# Patient Record
Sex: Male | Born: 1965 | Race: White | Hispanic: No | Marital: Married | Smoking: Never smoker
Health system: Southern US, Community
[De-identification: ages and names within clinical notes are randomized; demographics above are authoritative.]

## PROBLEM LIST (undated history)

## (undated) DIAGNOSIS — I2510 Atherosclerotic heart disease of native coronary artery without angina pectoris: Secondary | ICD-10-CM

## (undated) DIAGNOSIS — Z8709 Personal history of other diseases of the respiratory system: Secondary | ICD-10-CM

## (undated) DIAGNOSIS — E785 Hyperlipidemia, unspecified: Secondary | ICD-10-CM

## (undated) DIAGNOSIS — K219 Gastro-esophageal reflux disease without esophagitis: Secondary | ICD-10-CM

## (undated) DIAGNOSIS — I340 Nonrheumatic mitral (valve) insufficiency: Principal | ICD-10-CM

## (undated) DIAGNOSIS — R42 Dizziness and giddiness: Secondary | ICD-10-CM

## (undated) DIAGNOSIS — I10 Essential (primary) hypertension: Secondary | ICD-10-CM

## (undated) DIAGNOSIS — Z9889 Other specified postprocedural states: Secondary | ICD-10-CM

## (undated) DIAGNOSIS — Z8719 Personal history of other diseases of the digestive system: Secondary | ICD-10-CM

## (undated) DIAGNOSIS — M109 Gout, unspecified: Secondary | ICD-10-CM

## (undated) DIAGNOSIS — Z9289 Personal history of other medical treatment: Secondary | ICD-10-CM

## (undated) HISTORY — DX: Gout, unspecified: M10.9

## (undated) HISTORY — DX: Nonrheumatic mitral (valve) insufficiency: I34.0

## (undated) HISTORY — PX: WISDOM TOOTH EXTRACTION: SHX21

## (undated) HISTORY — DX: Essential (primary) hypertension: I10

## (undated) HISTORY — PX: HAND SURGERY: SHX662

## (undated) HISTORY — DX: Atherosclerotic heart disease of native coronary artery without angina pectoris: I25.10

## (undated) HISTORY — PX: KNEE SURGERY: SHX244

## (undated) HISTORY — DX: Personal history of other medical treatment: Z92.89

---

## 2006-01-02 ENCOUNTER — Ambulatory Visit: Payer: Self-pay | Admitting: Family Medicine

## 2007-02-03 ENCOUNTER — Encounter: Payer: Self-pay | Admitting: Family Medicine

## 2007-02-23 ENCOUNTER — Ambulatory Visit: Payer: Self-pay | Admitting: Family Medicine

## 2007-02-23 DIAGNOSIS — K209 Esophagitis, unspecified without bleeding: Secondary | ICD-10-CM | POA: Insufficient documentation

## 2007-02-23 DIAGNOSIS — E782 Mixed hyperlipidemia: Secondary | ICD-10-CM | POA: Insufficient documentation

## 2007-02-23 LAB — CONVERTED CEMR LAB: Cholesterol: 261 mg/dL

## 2007-03-02 ENCOUNTER — Ambulatory Visit: Payer: Self-pay | Admitting: Family Medicine

## 2008-11-11 ENCOUNTER — Ambulatory Visit: Payer: Self-pay | Admitting: Family Medicine

## 2008-11-11 DIAGNOSIS — R229 Localized swelling, mass and lump, unspecified: Secondary | ICD-10-CM | POA: Insufficient documentation

## 2008-11-22 ENCOUNTER — Telehealth (INDEPENDENT_AMBULATORY_CARE_PROVIDER_SITE_OTHER): Payer: Self-pay | Admitting: *Deleted

## 2008-11-22 ENCOUNTER — Ambulatory Visit: Payer: Self-pay | Admitting: Family Medicine

## 2008-11-22 DIAGNOSIS — M109 Gout, unspecified: Secondary | ICD-10-CM | POA: Insufficient documentation

## 2009-06-03 ENCOUNTER — Ambulatory Visit: Payer: Self-pay | Admitting: Family Medicine

## 2009-07-01 ENCOUNTER — Encounter: Payer: Self-pay | Admitting: Family Medicine

## 2010-04-20 NOTE — Consult Note (Signed)
Summary: [facility]   Imported By: Lanelle Bal 07/28/2009 11:30:42  _____________________________________________________________________  External Attachment:    Type:   Image     Comment:   External Document
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
## 2010-04-20 NOTE — Consult Note (Signed)
Summary: [facility]   Imported By: Lanelle Bal 07/28/2009 11:35:06  _____________________________________________________________________  External Attachment:    Type:   Image     Comment:   External Document

## 2010-05-21 ENCOUNTER — Ambulatory Visit (INDEPENDENT_AMBULATORY_CARE_PROVIDER_SITE_OTHER): Payer: BC Managed Care – PPO | Admitting: Family Medicine

## 2010-05-21 ENCOUNTER — Encounter: Payer: Self-pay | Admitting: Family Medicine

## 2010-05-21 DIAGNOSIS — R059 Cough, unspecified: Secondary | ICD-10-CM

## 2010-05-21 DIAGNOSIS — K209 Esophagitis, unspecified without bleeding: Secondary | ICD-10-CM

## 2010-05-21 DIAGNOSIS — R05 Cough: Secondary | ICD-10-CM

## 2010-05-24 ENCOUNTER — Emergency Department (HOSPITAL_COMMUNITY)
Admission: EM | Admit: 2010-05-24 | Discharge: 2010-05-24 | Disposition: A | Discharge status: Home / Self Care | Payer: BC Managed Care – PPO | Attending: Emergency Medicine | Admitting: Emergency Medicine

## 2010-05-24 ENCOUNTER — Emergency Department (HOSPITAL_COMMUNITY): Payer: BC Managed Care – PPO

## 2010-05-24 DIAGNOSIS — Z862 Personal history of diseases of the blood and blood-forming organs and certain disorders involving the immune mechanism: Secondary | ICD-10-CM | POA: Insufficient documentation

## 2010-05-24 DIAGNOSIS — R12 Heartburn: Secondary | ICD-10-CM | POA: Insufficient documentation

## 2010-05-24 DIAGNOSIS — R071 Chest pain on breathing: Secondary | ICD-10-CM | POA: Insufficient documentation

## 2010-05-24 DIAGNOSIS — Z8639 Personal history of other endocrine, nutritional and metabolic disease: Secondary | ICD-10-CM | POA: Insufficient documentation

## 2010-05-24 LAB — POCT I-STAT, CHEM 8
BUN: 16 mg/dL (ref 6–23)
Chloride: 105 mEq/L (ref 96–112)
Creatinine, Ser: 1.2 mg/dL (ref 0.4–1.5)
Potassium: 3.6 mEq/L (ref 3.5–5.1)
Sodium: 140 mEq/L (ref 135–145)
TCO2: 23 mmol/L (ref 0–100)

## 2010-05-24 LAB — POCT CARDIAC MARKERS
CKMB, poc: 2.8 ng/mL (ref 1.0–8.0)
Myoglobin, poc: 108 ng/mL (ref 12–200)
Troponin i, poc: <0.05 ng/mL (ref 0.00–0.09)
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
## 2010-06-02 ENCOUNTER — Telehealth (INDEPENDENT_AMBULATORY_CARE_PROVIDER_SITE_OTHER): Payer: Self-pay | Admitting: *Deleted

## 2010-06-08 NOTE — Progress Notes (Signed)
Summary: requests something for gout  Phone Note Call from Patient Call back at [phone]   Caller: Patient Summary of Call: Pt states he is having a painful gout flare up in right great toe and he is asking if something can be [facility] to [facility].  [facility]. Initial call taken by: Lowella Petties CMA, AAMA,  June 02, 2010 9:35 AM  Follow-up for Phone Call        f/u if not improving in the next couple of days.  colchicine often causes some GI upset and occ. diarrhea. This is now branded -- i would suggest printing a coupon from the colcrys website prior to picking it up. Follow-up by: Hannah Beat MD,  June 02, 2010 9:56 AM  Additional Follow-up for Phone Call Additional follow up Details #1::        Patient advised via message  Additional Follow-up by: Benny Lennert CMA (AAMA),  June 02, 2010 10:30 AM    New/Updated Medications: DICLOFENAC SODIUM 75 MG TBEC (DICLOFENAC SODIUM) 1 by mouth bid. take with food COLCRYS 0.6 MG TABS (COLCHICINE) 1 by mouth two times a day Prescriptions: COLCRYS 0.6 MG TABS (COLCHICINE) 1 by mouth two times a day  #60 x 0   Entered and Authorized by:   Hannah Beat MD   Signed by:   Hannah Beat MD on 06/02/2010   Method used:   [facility]        [facility] [id number]       [address]       [place], [place]  [address]       Ph: [phone] or [phone]       Fax: [phone]   RxID:   [id number] DICLOFENAC SODIUM 75 MG TBEC (DICLOFENAC SODIUM) 1 by mouth bid. take with food  #40 x 0   Entered and Authorized by:   Hannah Beat MD   Signed by:   Hannah Beat MD on 06/02/2010   Method used:   [facility]        [facility] ([facility]ail[facility]       [address]       [place], [place]  [address]       Ph: [phone] or [phone]       Fax: [phone]   RxID:   [id number]

## 2010-08-06 NOTE — Assessment & Plan Note (Signed)
[facility]                             [facility] OFFICE NOTE   Lawrence, Underwood                          MRN:          [id number]  DATE:01/02/2006                            DOB:          June 01, 1965    CHIEF COMPLAINT:  A 45 year old, white male here to establish new doctor.  He is new to the area having moved from [place] in July 2007.  He states  he is interested in talking about chest pain, intermittent.  He has had  several episodes over the past few years with one to two episodes per year  of left upper chest pain.  The pain initially is sharp lasting a few  minutes, but then there is lingering chest pain for about 4-8 hours.  When  he has the chest pain, he is at rest and he denies any chest pain with  exercise or exertion.  He denies nausea, vomiting or sweating or shortness  of breath along with the chest pain.  He states that he is not under a  significant amount of stress.  He does report some feelings of heartburn at  night that he thinks are worse with alcohol and soda.  He is overweight and  Lawrence Underwood realizes he is over his ideal weight.  He is interested in  discussing nutrition and getting back into a regular exercise pattern.   REVIEW OF SYSTEMS:  No headache, no dizziness, no syncope, no glasses, no  hearing problems, no dyspnea, no palpitations, no wheeze, no cough, no  nausea, vomiting, diarrhea or rectal bleeding, no nocturia.  No increased  thirst.  He does have some rhinitis in the spring.   PAST MEDICAL HISTORY:  None.   PAST SURGICAL HISTORY:  1. Knee injury resulting in surgery at age 58.  2. Hand surgery resulting in surgery at age 45.   ALLERGIES:  No known drug allergies.   MEDICATIONS:  None.   SOCIAL HISTORY:  He works in Air[employer].  He has been married x13 years and has  three kids ranging age 39-10.  They are all healthy.  He walks some, but not  as much as he use to and is in a regular exercise routine.   He frequently  skips breakfast or lung and eats a huge dinner including meats and  vegetables as well as lots of cheese.  He sometimes has a nighttime snack.  He only occasionally gets fruit.  He is trying to avoid soda and ice tea.  He drinks 2-3 beers on the weekends.  He does not smoke and does not use any  other drugs.   FAMILY HISTORY:  Father alive at age 67.  He had a heart attack at age 34  and stroke at age 65.  Mother alive at age 11, healthy.  Two sisters are  healthy.  No family history of diabetes.  No family history of cancer.  His  parents do have depression and sister did have anorexia.  He does feel that  his father was an alcoholic.  His uncle also had an  MI at age 17.   PHYSICAL EXAMINATION:  VITAL SIGNS:  Height 70.5 inches, weight 204.6, BMI  29.  Blood pressure 122/84, pulse 80, temperature 97.6.  GENERAL:  Overweight, appearing muscular male in no apparent distress.  HEENT:  PERRLA.  Extraocular movements intact.  Oropharynx clear.  Tympanic  membranes clear.  Nares clear.  No thyromegaly.  No lymphadenopathy,  supraclavicular or cervical.  CARDIAC:  Regular rate and rhythm, no murmurs, rubs or gallops.  Normal PMI  with 2+ peripheral pulses, no peripheral edema.  LUNGS:  Clear to auscultation bilaterally, no wheezes, rales or rhonchi.  No  cyanosis or clubbing.  ABDOMEN:  Soft, nontender, normoactive bowel sounds.  No hepatosplenomegaly.  MUSCULOSKELETAL:  Strength 5/5 in upper and lower extremities.  NEUROLOGIC:  Alert and oriented x3.  Cranial nerves 2-12 grossly intact.  Reflexes 2+.   DIAGNOSTIC STUDIES:  EKG normal sinus rhythm with no Q waves or ST changes.   ASSESSMENT/PLAN:  1. Chest pain, noncardiac.  This is most likely secondary to intermittent      reflux symptoms.  I will obtain records from his previous doctor.  He      will, in the meantime, begin Prilosec 40 mg daily x4-6 weeks to treat      reflux.  He will avoid foods that trigger his  symptoms.  If his      symptoms do not improve, we can consider further workup.  Given his      family history of coronary artery disease, we can consider risk factor      modification aggressively.  He will look into when his last cholesterol      panel was checked and I will see if the records of this are included in      the records we will receive from his previous physician.  In the      meantime, he was encouraged to increase exercise and to work on weight      loss.  I encouraged him to eat three meals a day.  2. Prevention.  We will need to check cholesterol panel if this has not      been done recently, although he states that he thinks the insurance      company checked it several months ago.  Encourage weight loss and      exercise as stated above.  The patient will follow up with me following      receipt of records.       [name], MD      AB/MedQ  DD:  01/02/2006  DT:  01/03/2006  Job #:  [id number]

## 2011-01-26 ENCOUNTER — Ambulatory Visit (INDEPENDENT_AMBULATORY_CARE_PROVIDER_SITE_OTHER): Payer: BC Managed Care – PPO | Admitting: Family Medicine

## 2011-01-26 ENCOUNTER — Ambulatory Visit (INDEPENDENT_AMBULATORY_CARE_PROVIDER_SITE_OTHER)
Admission: RE | Admit: 2011-01-26 | Discharge: 2011-01-26 | Disposition: A | Discharge status: Home / Self Care | Payer: BC Managed Care – PPO | Source: Ambulatory Visit | Attending: Family Medicine | Admitting: Family Medicine

## 2011-01-26 ENCOUNTER — Encounter: Payer: Self-pay | Admitting: Family Medicine

## 2011-01-26 VITALS — BP 120/72 | HR 68 | Temp 97.9°F | Wt 206.0 lb

## 2011-01-26 DIAGNOSIS — M25572 Pain in left ankle and joints of left foot: Secondary | ICD-10-CM

## 2011-01-26 DIAGNOSIS — M109 Gout, unspecified: Secondary | ICD-10-CM

## 2011-01-26 DIAGNOSIS — M25579 Pain in unspecified ankle and joints of unspecified foot: Secondary | ICD-10-CM

## 2011-01-26 MED ORDER — INDOMETHACIN 50 MG PO CAPS: 50.0000 mg | ORAL_CAPSULE | Freq: Three times a day (TID) | ORAL | Status: AC

## 2011-01-26 MED ORDER — COLCHICINE 0.6 MG PO TABS: 0.6000 mg | ORAL_TABLET | Freq: Two times a day (BID) | ORAL | Status: DC

## 2011-01-26 NOTE — Progress Notes (Signed)
  Subjective:    Patient ID: Lawrence Underwood, male    DOB: Aug 31, 1965, 45 y.o.   MRN: [id number]  HPI  Lawrence Underwood, a 45 y.o. male presents today in the office for the following:    The last three weeks was walking consistent. L median ankle.   Pleasant gentleman with a history of gout, who is otherwise healthy who presents with a several day history of left-sided median ankle pain. Has no occult injury. He has been increasing his activity over the last 2-3 weeks and walking about 2 miles a day. No ankle inversion or eversion. He does have some swelling medially and laterally. Some mild degree of warmth.  The PMH, PSH, Social History, Family History, Medications, and allergies have been reviewed in [facility], and have been updated if relevant.   Review of Systems REVIEW OF SYSTEMS  GEN: No fevers, chills. Nontoxic. Primarily MSK c/o today. MSK: Detailed in the HPI GI: tolerating PO intake without difficulty Neuro: No numbness, parasthesias, or tingling associated. Otherwise the pertinent positives of the ROS are noted above.      Objective:   Physical Exam   Physical Exam  Blood pressure 120/72, pulse 68, temperature 97.9 F (36.6 C), temperature source Oral, weight 206 lb (93.441 kg).  GEN: WDWN, NAD, Non-toxic, A & O x 3 HEENT: Atraumatic, Normocephalic. Neck supple. No masses, No LAD. Ears and Nose: No external deformity. EXTR: No c/c/e NEURO Normal gait.  PSYCH: Normally interactive. Conversant. Not depressed or anxious appearing.  Calm demeanor.   Entire forefoot is nontender. Nontender on the lateral malleolus. Mildly tender along the lateral malleolus. Moderately tender medially, round area inferior to the malleolus on the medial side. In the region of the deltoid ligament and posterior tibial tendon there is some mild degree of warmth and swelling. There is also some swelling laterally. Nontender to ATF or CFL. Negative drawer testing. Nontender along the entire forefoot. Nontender  at the navicular and cuboid.      Assessment & Plan:    1. Left ankle pain  DG Ankle Complete Left, indomethacin (INDOCIN) 50 MG capsule, colchicine 0.6 MG tablet    Orders Placed This Encounter  Procedures  . DG Ankle Complete Left    Standing Status: Future     Number of Occurrences: 1     Standing Expiration Date: 03/27/2012    Order Specific Question:  Preferred imaging location?    Answer:  [facility]    Order Specific Question:  Reason for exam:    Answer:  medial ankle pain    X-ray, Ankle: AP, Lateral, and Mortise Views Indication: Ankle pain Findings: There is no evidence for acute fracture or dislocation. The mortise appears preserved.   Discharge plan clinically most consistent with an acute ankle gout flare. Place the patient on Indocin and colchicine.

## 2011-02-17 ENCOUNTER — Encounter: Payer: Self-pay | Admitting: Family Medicine

## 2011-02-17 ENCOUNTER — Ambulatory Visit (INDEPENDENT_AMBULATORY_CARE_PROVIDER_SITE_OTHER): Payer: BC Managed Care – PPO | Admitting: Family Medicine

## 2011-02-17 VITALS — BP 120/84 | HR 70 | Temp 98.5°F | Ht 70.5 in | Wt 203.8 lb

## 2011-02-17 DIAGNOSIS — R05 Cough: Secondary | ICD-10-CM

## 2011-02-17 DIAGNOSIS — R053 Chronic cough: Secondary | ICD-10-CM

## 2011-02-17 DIAGNOSIS — R059 Cough, unspecified: Secondary | ICD-10-CM

## 2011-02-17 DIAGNOSIS — Z23 Encounter for immunization: Secondary | ICD-10-CM

## 2011-02-17 DIAGNOSIS — E782 Mixed hyperlipidemia: Secondary | ICD-10-CM

## 2011-02-17 DIAGNOSIS — M109 Gout, unspecified: Secondary | ICD-10-CM

## 2011-02-17 DIAGNOSIS — K209 Esophagitis, unspecified without bleeding: Secondary | ICD-10-CM

## 2011-02-17 DIAGNOSIS — Z0000 Encounter for general adult medical examination without abnormal findings: Secondary | ICD-10-CM

## 2011-02-17 NOTE — Progress Notes (Signed)
Subjective:    Patient ID: Lawrence Underwood, male    DOB: 22-Feb-1966, 45 y.o.   MRN: [id number]  HPI   45 year old male presents   Elevated Cholesterol: Due for re-eval but will have at work in 06/2011 Working aggressively on lifestyle. Diet compliance:occ sweet binging, rare fast food. Eating a lot of veggies, some fruits.  Exercise: He has been walking 2 miles everyday, HAs lost 3 lbs in last few months.  Other complaints: No CP, no SOB.   Gout: Attack about once a year. No clear triggers. Limited alcohol. Using colchicine prn. Not on any preventative. Will have uric acid test in 06/2011 at work  GERD: Well controlled.. Not using in last 6 months.  Was sick 3 weeks ago.. cough... persistant cough since then, mild, gradually improving.  Occ using OTC med, none now. Sneezing, congestion.  No reflux. No ST.     Review of Systems  Constitutional: Negative for fever, fatigue and unexpected weight change.  HENT: Negative for ear pain, congestion, sore throat, rhinorrhea, trouble swallowing and postnasal drip.   Eyes: Negative for pain.  Respiratory: Negative for cough, shortness of breath and wheezing.   Cardiovascular: Negative for chest pain, palpitations and leg swelling.  Gastrointestinal: Negative for nausea, abdominal pain, diarrhea, constipation and blood in stool.  Genitourinary: Negative for dysuria, urgency, hematuria, discharge, penile swelling, scrotal swelling, difficulty urinating, penile pain and testicular pain.  Skin: Negative for rash.  Neurological: Negative for syncope, weakness, light-headedness, numbness and headaches.  Psychiatric/Behavioral: Negative for behavioral problems and dysphoric mood. The patient is not nervous/anxious.        Objective:   Physical Exam  Constitutional: He appears well-developed and well-nourished.  Non-toxic appearance. He does not appear ill. No distress.  HENT:  Head: Normocephalic and atraumatic.  Right Ear: Hearing, tympanic  membrane, external ear and ear canal normal.  Left Ear: Hearing, tympanic membrane, external ear and ear canal normal.  Nose: Nose normal.  Mouth/Throat: Uvula is midline, oropharynx is clear and moist and mucous membranes are normal.  Eyes: Conjunctivae, EOM and lids are normal. Pupils are equal, round, and reactive to light. No foreign bodies found.  Neck: Trachea normal, normal range of motion and phonation normal. Neck supple. Carotid bruit is not present. No mass and no thyromegaly present.  Cardiovascular: Normal rate, regular rhythm, S1 normal, S2 normal, intact distal pulses and normal pulses.  Exam reveals no gallop.   No murmur heard. Pulmonary/Chest: Breath sounds normal. He has no wheezes. He has no rhonchi. He has no rales.  Abdominal: Soft. Normal appearance and bowel sounds are normal. There is no hepatosplenomegaly. There is no tenderness. There is no rebound, no guarding and no CVA tenderness. No hernia. Hernia confirmed negative in the right inguinal area and confirmed negative in the left inguinal area.  Genitourinary: Testes normal and penis normal. Right testis shows no mass and no tenderness. Left testis shows no mass and no tenderness. No paraphimosis or penile tenderness.  Lymphadenopathy:    He has no cervical adenopathy.       Right: No inguinal adenopathy present.       Left: No inguinal adenopathy present.  Neurological: He is alert. He has normal strength and normal reflexes. No cranial nerve deficit or sensory deficit. Gait normal.  Skin: Skin is warm, dry and intact. No rash noted.  Psychiatric: He has a normal mood and affect. His speech is normal and behavior is normal. Judgment normal.  Assessment & Plan:  Complet physcial Exam: The patient's preventative maintenance and recommended screening tests for an annual wellness exam were reviewed in full today. Brought up to date unless services declined.  Counselled on the importance of diet, exercise,  and its role in overall health and mortality. The patient's FH and SH was reviewed, including their home life, tobacco status, and drug and alcohol status.   Refused tdap.. Will do next appt. Flu vaccine given.

## 2011-02-17 NOTE — Assessment & Plan Note (Signed)
Stable well controlled

## 2011-02-17 NOTE — Assessment & Plan Note (Addendum)
Nonsmoker.  Improving slowly after acute illness.. Likely post inflammatory cough. Exam nml.  Can try antihistamine OTC if not improving.

## 2011-02-17 NOTE — Assessment & Plan Note (Signed)
Working aggressively on lifestyle changes.. If persistantly high at next check at work in 06/2011.. Will consider medication to treat.

## 2011-02-17 NOTE — Patient Instructions (Addendum)
Have labs in April sent to [place] at that time. Will review. If cholesterol not at goal or uric acid remains high... May consider prescription treatment of both.  Follow up appt in 1 year for CPX.

## 2011-02-17 NOTE — Assessment & Plan Note (Signed)
Due for uric acid.. Will have done at work in 06/2010.. If remains high given yearly flare.. Consider allopurinol.

## 2012-03-13 ENCOUNTER — Ambulatory Visit (INDEPENDENT_AMBULATORY_CARE_PROVIDER_SITE_OTHER): Payer: BC Managed Care – PPO | Admitting: Family Medicine

## 2012-03-13 ENCOUNTER — Encounter: Payer: Self-pay | Admitting: Family Medicine

## 2012-03-13 VITALS — BP 126/78 | HR 68 | Temp 97.8°F | Wt 205.8 lb

## 2012-03-13 DIAGNOSIS — N453 Epididymo-orchitis: Secondary | ICD-10-CM

## 2012-03-13 DIAGNOSIS — N451 Epididymitis: Secondary | ICD-10-CM

## 2012-03-13 NOTE — Assessment & Plan Note (Signed)
The testicle itself isn't tender.  He has no high risk behaviors.  No mass palpated.  He is on doxy already.  I would add on ibuprofen with GI caution and use local warm compresses to see if that helps.  If not improved, will have him notify PCP.  Would defer further w/u, ie consideration of scrotal u/s, to PCP.  Should be okay to travel by car.

## 2012-03-13 NOTE — Progress Notes (Signed)
He had some pain/pressure in L testicle episodically.  Noted initially about 3 weeks ago.  Was seen at [facility] and dx'd with epididymitis.  He'll still get pain episodically and urinary urgency during the episodes.  Started on doxy in meantime via UC.  He was overall improved until he had an episode of pain today.  He had been running more in the last few weeks, training for a 5K.  He's getting ready to leave town to go to [employer].    Meds, vitals, and allergies reviewed.   ROS: See HPI.  Otherwise, noncontributory.  nad Testes bilaterally descended without nodularity or masses. No scrotal masses or lesions. No penis lesions or urethral discharge. He is ttp on the superior aspect of the L testicle, but nowhere else.  No hernias

## 2012-03-13 NOTE — Patient Instructions (Addendum)
Finish the doxycycline.   Take up to 600mg  of ibuprofen up to 3 times a day with food.  Use a warm compress; see if that helps.  Wear supportive underwear and don't run for now.  If not improved, notify Dr. Ermalene Searing next week.   Take care.

## 2013-04-09 ENCOUNTER — Encounter: Payer: Self-pay | Admitting: Family Medicine

## 2013-04-09 ENCOUNTER — Ambulatory Visit (INDEPENDENT_AMBULATORY_CARE_PROVIDER_SITE_OTHER): Payer: BC Managed Care – PPO | Admitting: Family Medicine

## 2013-04-09 VITALS — BP 110/74 | HR 65 | Temp 97.5°F | Ht 70.0 in | Wt 197.0 lb

## 2013-04-09 DIAGNOSIS — E782 Mixed hyperlipidemia: Secondary | ICD-10-CM

## 2013-04-09 DIAGNOSIS — R7303 Prediabetes: Secondary | ICD-10-CM | POA: Insufficient documentation

## 2013-04-09 DIAGNOSIS — Z0000 Encounter for general adult medical examination without abnormal findings: Secondary | ICD-10-CM

## 2013-04-09 DIAGNOSIS — R7309 Other abnormal glucose: Secondary | ICD-10-CM

## 2013-04-09 DIAGNOSIS — Z23 Encounter for immunization: Secondary | ICD-10-CM

## 2013-04-09 NOTE — Addendum Note (Signed)
Addended by: Damita Lack on: 04/09/2013 08:01 AM   Modules accepted: Orders

## 2013-04-09 NOTE — Patient Instructions (Addendum)
Work on low cholesterol , low carb diet. Increase exercise ans weight loss. Call to schedule fasting labs in 3 months. Scheduled Tdap ( tetanus) if interested.

## 2013-04-09 NOTE — Assessment & Plan Note (Signed)
Encouraged exercise, weight loss, healthy eating habits. ? ?

## 2013-04-09 NOTE — Progress Notes (Signed)
Pre-visit discussion using our clinic review tool. No additional management support is needed unless otherwise documented below in the visit note.  

## 2013-04-09 NOTE — Progress Notes (Signed)
48 year old male presents  for annual wellness exam and preventative care.    Has labs fdone at work.  Noted BS  01/2012 100 11/14 103  no family history of DM.  Working on healthy eating and regular exercise. He has lost weight but has gained some back. Wt Readings from Last 3 Encounters:  04/09/13 197 lb (89.359 kg)  03/13/12 205 lb 12 oz (93.328 kg)  02/17/11 203 lb 12.8 oz (92.443 kg)   Elevated Cholesterol:  Done at work Total 247.. ? HDL and LDL  he will bring the new info. Working aggressively on lifestyle. Other complaints: No CP, no SOB.   Gout:No attacks in last year. No clear triggers. Limited alcohol.  Using colchicine prn. Not on any preventative.  Will have uric acid test in ? at work   GERD: Well controlled.. Not using in last 6 months.   Review of Systems  Constitutional: Negative for fever, fatigue and unexpected weight change.  HENT: Negative for ear pain, congestion, sore throat, rhinorrhea, trouble swallowing and postnasal drip.  Eyes: Negative for pain.  Respiratory: Negative for cough, shortness of breath and wheezing.  Cardiovascular: Negative for chest pain, palpitations and leg swelling.  Gastrointestinal: Negative for nausea, abdominal pain, diarrhea, constipation and blood in stool.  Genitourinary: Negative for dysuria, urgency, hematuria, discharge, penile swelling, scrotal swelling, difficulty urinating, penile pain and testicular pain.  Skin: Negative for rash.  Neurological: Negative for syncope, weakness, light-headedness, numbness and headaches.  Psychiatric/Behavioral: Negative for behavioral problems and dysphoric mood. The patient is not nervous/anxious.  Objective:   Physical Exam  Constitutional: He appears well-developed and well-nourished. Non-toxic appearance. He does not appear ill. No distress.  HENT:  Head: Normocephalic and atraumatic.  Right Ear: Hearing, tympanic membrane, external ear and ear canal normal.  Left Ear: Hearing,  tympanic membrane, external ear and ear canal normal.  Nose: Nose normal.  Mouth/Throat: Uvula is midline, oropharynx is clear and moist and mucous membranes are normal.  Eyes: Conjunctivae, EOM and lids are normal. Pupils are equal, round, and reactive to light. No foreign bodies found.  Neck: Trachea normal, normal range of motion and phonation normal. Neck supple. Carotid bruit is not present. No mass and no thyromegaly present.  Cardiovascular: Normal rate, regular rhythm, S1 normal, S2 normal, intact distal pulses and normal pulses. Exam reveals no gallop.  No murmur heard.  Pulmonary/Chest: Breath sounds normal. He has no wheezes. He has no rhonchi. He has no rales.  Abdominal: Soft. Normal appearance and bowel sounds are normal. There is no hepatosplenomegaly. There is no tenderness. There is no rebound, no guarding and no CVA tenderness. No hernia. Hernia confirmed negative in the right inguinal area and confirmed negative in the left inguinal area.  Genitourinary: Testes normal and penis normal. Right testis shows no mass and no tenderness. Left testis shows no mass and no tenderness. No paraphimosis or penile tenderness.  Lymphadenopathy:  He has no cervical adenopathy.  Right: No inguinal adenopathy present.  Left: No inguinal adenopathy present.  Neurological: He is alert. He has normal strength and normal reflexes. No cranial nerve deficit or sensory deficit. Gait normal.  Skin: Skin is warm, dry and intact. No rash noted.  Psychiatric: He has a normal mood and affect. His speech is normal and behavior is normal. Judgment normal.  Assessment & Plan:   Complet physcial Exam: The patient's preventative maintenance and recommended screening tests for an annual wellness exam were reviewed in full today.  Brought  up to date unless services declined.  Counselled on the importance of diet, exercise, and its role in overall health and mortality.  The patient's FH and SH was reviewed,  including their home life, tobacco status, and drug and alcohol status.   Tdap...will consider.  Flu vaccine refused.  Nonsmoker.  NO early family history of prostate or colon cancer.

## 2013-04-09 NOTE — Assessment & Plan Note (Signed)
He will drop off labs from work for my review. Discussed diet changes in detail.

## 2013-06-17 ENCOUNTER — Encounter (HOSPITAL_COMMUNITY): Payer: Self-pay | Admitting: Emergency Medicine

## 2013-06-17 ENCOUNTER — Emergency Department (HOSPITAL_COMMUNITY)
Admission: EM | Admit: 2013-06-17 | Discharge: 2013-06-17 | Disposition: A | Discharge status: Home / Self Care | Payer: BC Managed Care – PPO | Attending: Emergency Medicine | Admitting: Emergency Medicine

## 2013-06-17 DIAGNOSIS — H5789 Other specified disorders of eye and adnexa: Secondary | ICD-10-CM | POA: Insufficient documentation

## 2013-06-17 DIAGNOSIS — H5710 Ocular pain, unspecified eye: Secondary | ICD-10-CM | POA: Insufficient documentation

## 2013-06-17 DIAGNOSIS — Z7982 Long term (current) use of aspirin: Secondary | ICD-10-CM | POA: Insufficient documentation

## 2013-06-17 DIAGNOSIS — H5711 Ocular pain, right eye: Secondary | ICD-10-CM

## 2013-06-17 DIAGNOSIS — M109 Gout, unspecified: Secondary | ICD-10-CM | POA: Insufficient documentation

## 2013-06-17 MED ORDER — HYDROCODONE-ACETAMINOPHEN 5-325 MG PO TABS: 2.0000 | ORAL_TABLET | Freq: Once | ORAL | Status: DC

## 2013-06-17 MED ORDER — FLUORESCEIN SODIUM 1 MG OP STRP
1.0000 | ORAL_STRIP | Freq: Once | OPHTHALMIC | Status: AC
  Administered 2013-06-17: 1 via OPHTHALMIC
  Filled 2013-06-17: qty 1

## 2013-06-17 MED ORDER — HYDROCODONE-ACETAMINOPHEN 5-325 MG PO TABS
1.0000 | ORAL_TABLET | Freq: Once | ORAL | Status: AC
  Administered 2013-06-17: 1 via ORAL
  Filled 2013-06-17: qty 1

## 2013-06-17 MED ORDER — TETRACAINE HCL 0.5 % OP SOLN
1.0000 [drp] | Freq: Once | OPHTHALMIC | Status: AC
  Administered 2013-06-17: 1 [drp] via OPHTHALMIC
  Filled 2013-06-17: qty 2

## 2013-06-17 MED ORDER — HYDROCODONE-ACETAMINOPHEN 5-325 MG PO TABS: 2.0000 | ORAL_TABLET | Freq: Four times a day (QID) | ORAL | Status: DC | PRN

## 2013-06-17 MED ORDER — TOBRAMYCIN-DEXAMETHASONE 0.3-0.1 % OP SUSP
2.0000 [drp] | OPHTHALMIC | Status: DC
  Administered 2013-06-17: 2 [drp] via OPHTHALMIC
  Filled 2013-06-17: qty 2.5

## 2013-06-17 NOTE — ED Provider Notes (Signed)
CSN: [id number]     Arrival date & time 06/17/13  2050 History  This chart was scribed for non-physician practitioner, Irish Elders, FNP,working with Dagmar Hait, MD, by Karle Plumber, ED Scribe.  This patient was seen in room TR04C/TR04C and the patient's care was started at 10:23 PM.  Chief Complaint  Patient presents with  . Eye Pain   The history is provided by the patient. No language interpreter was used.   HPI Comments:  Thoams Underwood is a 48 y.o. male who presents to the Emergency Department complaining of right eye pain, burning and irritation that started yesterday. Pt states he used a new product called Clear Care on his contact lens yesterday that caused some irritation. He states he went to see his optometrist earlier today and he administered some eye drops and was told to expect cloudy vision, but it would be ok for him to continue wearing his contact lenses. Pt states he tried to wear them afterwards but states it was too painful. He denies loss of vision and states the left eye is without pain or problem.    Past Medical History  Diagnosis Date  . Gout    Past Surgical History  Procedure Laterality Date  . Hand surgery      R hand  . Knee surgery      L knee arthroscopy   No family history on file. History  Substance Use Topics  . Smoking status: Never Smoker   . Smokeless tobacco: Never Used  . Alcohol Use: Yes     Comment: occasional    Review of Systems  Constitutional: Negative for fever.  Eyes: Positive for pain (right eye) and redness (right eye).  All other systems reviewed and are negative.    Allergies  Review of patient's allergies indicates no known allergies.  Home Medications   Current Outpatient Rx  Name  Route  Sig  Dispense  Refill  . aspirin (BAYER ASPIRIN) 325 MG tablet      As needed          . colchicine 0.6 MG tablet   Oral   Take 0.6 mg by mouth as needed.          . Fish Oil OIL      Take one capsule by  mouth 2 times a day.           Triage Vitals: BP 166/95  Pulse 70  Temp(Src) 98.5 F (36.9 C) (Oral)  Resp 14  Ht 5\' 10"  (1.778 m)  Wt 196 lb (88.905 kg)  BMI 28.12 kg/m2  SpO2 96% Physical Exam  Nursing note and vitals reviewed. Constitutional: He is oriented to person, place, and time. He appears well-developed and well-nourished.  HENT:  Head: Normocephalic and atraumatic.  Eyes: EOM and lids are normal. Pupils are equal, round, and reactive to light. Lids are everted and swept, no foreign bodies found. Right eye exhibits no chemosis, no discharge, no exudate and no hordeolum. No foreign body present in the right eye. Left eye exhibits no chemosis, no discharge, no exudate and no hordeolum. No foreign body present in the left eye. Right conjunctiva is injected. Right conjunctiva has no hemorrhage. Left conjunctiva is not injected. Left conjunctiva has no hemorrhage.  Neck: Normal range of motion.  Cardiovascular: Normal rate.   Pulmonary/Chest: Effort normal.  Musculoskeletal: Normal range of motion.  Neurological: He is alert and oriented to person, place, and time.  Skin: Skin is warm and dry.  Psychiatric: He has a normal mood and affect. His behavior is normal.    ED Course  Procedures (including critical care time) DIAGNOSTIC STUDIES: Oxygen Saturation is 96% on RA, adequate by my interpretation.   COORDINATION OF CARE: 10:27 PM- Will administer tetracaine drops and use fluorescein strip to check for abrasion. Pt verbalizes understanding and agrees to plan.  Medications  tetracaine (PONTOCAINE) 0.5 % ophthalmic solution 1 drop (not administered)  fluorescein ophthalmic strip 1 strip (not administered)  HYDROcodone-acetaminophen (NORCO/VICODIN) 5-325 MG per tablet 1 tablet (not administered)    Labs Review Labs Reviewed - No data to display Imaging Review No results found.   EKG Interpretation None    Unable to do slit lamp exam, lamp unavailable.   MDM    Final diagnoses:  Pain, eye, right   Pt advised not to wear contact lenses until eye irritation clears. Seen by optometrist today. Pt advised feeling better after hydrocodone and tetracaine drops. Discussed plan of care with pt. Advised to keep eyes well lubricated with saline drops and use tobradex 3-4 times a day while awake. Return if no gradual improvement in symptoms.    I personally performed the services described in this documentation, which was scribed in my presence. The recorded information has been reviewed and is accurate.    Irish EldersKelly Derriona Branscom, NP 06/18/13 2253

## 2013-06-17 NOTE — Discharge Instructions (Signed)
Corneal Abrasion The cornea is the clear covering at the front and center of the eye. When looking at the colored portion of the eye (iris), you are looking through the cornea. This very thin tissue is made up of many layers. The surface layer is a single layer of cells (corneal epithelium) and is one of the most sensitive tissues in the body. If a scratch or injury causes the corneal epithelium to come off, it is called a corneal abrasion. If the injury extends to the tissues below the epithelium, the condition is called a corneal ulcer. CAUSES   Scratches.  Trauma.  Foreign body in the eye. Some people have recurrences of abrasions in the area of the original injury even after it has healed (recurrent erosion syndrome). Recurrent erosion syndrome generally improves and goes away with time. SYMPTOMS   Eye pain.  Difficulty or inability to keep the injured eye open.  The eye becomes very sensitive to light.  Recurrent erosions tend to happen suddenly, first thing in the morning, usually after waking up and opening the eye. DIAGNOSIS  Your health care provider can diagnose a corneal abrasion during an eye exam. Dye is usually placed in the eye using a drop or a small paper strip moistened by your tears. When the eye is examined with a special light, the abrasion shows up clearly because of the dye. TREATMENT   Small abrasions may be treated with antibiotic drops or ointment alone.  Usually a pressure patch is specially applied. Pressure patches prevent the eye from blinking, allowing the corneal epithelium to heal. A pressure patch also reduces the amount of pain present in the eye during healing. Most corneal abrasions heal within 2 3 days with no effect on vision. If the abrasion becomes infected and spreads to the deeper tissues of the cornea, a corneal ulcer can result. This is serious because it can cause corneal scarring. Corneal scars interfere with light passing through the cornea  and cause a loss of vision in the involved eye. HOME CARE INSTRUCTIONS  Use medicine or ointment as directed. Only take over-the-counter or prescription medicines for pain, discomfort, or fever as directed by your health care provider.  Do not drive or operate machinery while your eye is patched. Your ability to judge distances is impaired.  If your health care provider has given you a follow-up appointment, it is very important to keep that appointment. Not keeping the appointment could result in a severe eye infection or permanent loss of vision. If there is any problem keeping the appointment, let your health care provider know. SEEK MEDICAL CARE IF:   You have pain, light sensitivity, and a scratchy feeling in one eye or both eyes.  Your pressure patch keeps loosening up, and you can blink your eye under the patch after treatment.  Any kind of discharge develops from the eye after treatment or if the lids stick together in the morning.  You have the same symptoms in the morning as you did with the original abrasion days, weeks, or months after the abrasion healed. MAKE SURE YOU:   Understand these instructions.  Will watch your condition.  Will get help right away if you are not doing well or get worse. Document Released: 03/04/2000 Document Revised: 12/26/2012 Document Reviewed: 11/12/2012 [facility] Patient Information 2014 [place], [place].   Saline drops to right eye Tobradex eye drops 4 times a day while awake to right eye Should have gradual improvement over the next 2-3 days

## 2013-06-17 NOTE — ED Notes (Signed)
Pt. reports right eye irritation from his contact lens last Sunday morning , seen by his optometrist today advised him to expect cloudy vision , presents with pain and redness at right eye .

## 2013-06-18 NOTE — ED Provider Notes (Signed)
Medical screening examination/treatment/procedure(s) were performed by non-physician practitioner and as supervising physician I was immediately available for consultation/collaboration.   EKG Interpretation None        William Anaja Monts, MD 06/18/13 2324 

## 2013-08-16 ENCOUNTER — Encounter: Payer: Self-pay | Admitting: Family Medicine

## 2013-08-16 ENCOUNTER — Ambulatory Visit (INDEPENDENT_AMBULATORY_CARE_PROVIDER_SITE_OTHER): Payer: BC Managed Care – PPO | Admitting: Family Medicine

## 2013-08-16 VITALS — BP 110/74 | HR 72 | Temp 97.7°F | Ht 70.0 in | Wt 197.2 lb

## 2013-08-16 DIAGNOSIS — R7309 Other abnormal glucose: Secondary | ICD-10-CM

## 2013-08-16 DIAGNOSIS — K921 Melena: Secondary | ICD-10-CM | POA: Insufficient documentation

## 2013-08-16 DIAGNOSIS — K648 Other hemorrhoids: Secondary | ICD-10-CM | POA: Insufficient documentation

## 2013-08-16 DIAGNOSIS — E7800 Pure hypercholesterolemia, unspecified: Secondary | ICD-10-CM

## 2013-08-16 DIAGNOSIS — R011 Cardiac murmur, unspecified: Secondary | ICD-10-CM | POA: Insufficient documentation

## 2013-08-16 DIAGNOSIS — R7303 Prediabetes: Secondary | ICD-10-CM

## 2013-08-16 MED ORDER — HYDROCORTISONE ACETATE 25 MG RE SUPP: 25.0000 mg | Freq: Every day | RECTAL | Status: DC

## 2013-08-16 NOTE — Assessment & Plan Note (Signed)
Treat relative constipation with fiber, miralx and water.  Rectal steroid supp.

## 2013-08-16 NOTE — Progress Notes (Signed)
   Subjective:    Patient ID: Lawrence Underwood, male    DOB: [birth year], 48 y.o.   MRN: [id number]  Rectal Bleeding  Associated symptoms include rectal pain. Pertinent negatives include no fever, no abdominal pain, no diarrhea, no nausea and no chest pain.    48 year old male presents with new onset blood in stool in last few days. He has noted bright red blood in toilet bowl, after BM. He has been constipated lately, having BMs 2-3 times day, but straining to get stool out. No dysuria. Mild rectal soreness, minimal pain with BM.  He has never had a colonoscopy. Family history: No colon cancer no IBD.  No lightheadedness, no fatigue.   He also brings in labs form work.Marland Kitchen LDL 163, glucose 106 fasting.,  Review of Systems  Constitutional: Negative for fever and fatigue.  Respiratory: Negative for shortness of breath.   Cardiovascular: Negative for chest pain.  Gastrointestinal: Positive for blood in stool, hematochezia, anal bleeding and rectal pain. Negative for nausea, abdominal pain and diarrhea.       Objective:   Physical Exam  Constitutional: Vital signs are normal. He appears well-developed and well-nourished.  HENT:  Head: Normocephalic.  Right Ear: Hearing normal.  Left Ear: Hearing normal.  Nose: Nose normal.  Mouth/Throat: Oropharynx is clear and moist and mucous membranes are normal.  Neck: Trachea normal. Carotid bruit is not present. No mass and no thyromegaly present.  Cardiovascular: Normal rate, regular rhythm and normal pulses.  Exam reveals no gallop, no distant heart sounds and no friction rub.   No murmur heard. No peripheral edema  Pulmonary/Chest: Effort normal and breath sounds normal. No respiratory distress.  Genitourinary: Prostate normal. Rectal exam shows internal hemorrhoid. Rectal exam shows no external hemorrhoid, no fissure, no mass, no tenderness and anal tone normal. Guaiac negative stool. Prostate is not enlarged and not tender.  Skin: Skin is  warm, dry and intact. No rash noted.  Psychiatric: He has a normal mood and affect. His speech is normal and behavior is normal. Thought content normal.          Assessment & Plan:

## 2013-08-16 NOTE — Assessment & Plan Note (Signed)
No current symptoms, no past issues.  Eval with ECHO of heart.

## 2013-08-16 NOTE — Assessment & Plan Note (Signed)
Encouraged exercise, weight loss, healthy eating habits.  recheck in 3 months. 

## 2013-08-16 NOTE — Progress Notes (Signed)
Pre visit review using our clinic review tool, if applicable. No additional management support is needed unless otherwise documented below in the visit note. 

## 2013-08-16 NOTE — Patient Instructions (Addendum)
Increase fiber and water slowly in diet. Start rectal suppositories. Call if not improving as expected. Stop at front desk for referral for ECHO to look into hear murmur.

## 2013-08-19 ENCOUNTER — Other Ambulatory Visit: Payer: Self-pay

## 2013-08-19 ENCOUNTER — Ambulatory Visit (HOSPITAL_COMMUNITY): Payer: BC Managed Care – PPO | Attending: Family Medicine | Admitting: Radiology

## 2013-08-19 DIAGNOSIS — E785 Hyperlipidemia, unspecified: Secondary | ICD-10-CM | POA: Insufficient documentation

## 2013-08-19 DIAGNOSIS — R011 Cardiac murmur, unspecified: Secondary | ICD-10-CM | POA: Insufficient documentation

## 2013-08-19 DIAGNOSIS — I340 Nonrheumatic mitral (valve) insufficiency: Secondary | ICD-10-CM

## 2013-08-19 NOTE — Progress Notes (Signed)
Echocardiogram performed.  

## 2013-08-20 NOTE — Addendum Note (Signed)
Addended by: [name] E on: 08/20/2013 07:56 AM   Modules accepted: Orders

## 2013-08-21 ENCOUNTER — Ambulatory Visit (INDEPENDENT_AMBULATORY_CARE_PROVIDER_SITE_OTHER): Payer: BC Managed Care – PPO | Admitting: Cardiovascular Disease

## 2013-08-21 ENCOUNTER — Encounter (INDEPENDENT_AMBULATORY_CARE_PROVIDER_SITE_OTHER): Payer: Self-pay

## 2013-08-21 ENCOUNTER — Encounter: Payer: Self-pay | Admitting: Cardiovascular Disease

## 2013-08-21 VITALS — BP 130/70 | HR 71 | Ht 70.0 in | Wt 199.0 lb

## 2013-08-21 DIAGNOSIS — I340 Nonrheumatic mitral (valve) insufficiency: Secondary | ICD-10-CM

## 2013-08-21 DIAGNOSIS — I059 Rheumatic mitral valve disease, unspecified: Secondary | ICD-10-CM

## 2013-08-21 NOTE — Patient Instructions (Addendum)
Your physician has requested that you have a TEE. During a TEE, sound waves are used to create images of your heart. It provides your doctor with information about the size and shape of your heart and how well your heart's chambers and valves are working. In this test, a transducer is attached to the end of a flexible tube that's guided down your throat and into your esophagus (the tube leading from you mouth to your stomach) to get a more detailed image of your heart. You are not awake for the procedure. Please see the instruction sheet given to you today. For further information please visit [email or web address]  Your physician has requested that you have an exercise tolerance test with PA/NP. For further information please visit [email or web address] Please also follow instruction sheet, as given.  Your physician recommends that you schedule a follow-up appointment with Dr Excell Seltzer after TEE and Stress Test.   Mitral Valvular Regurgitation Mitral valvular regurgitation (MVR, MR) is a condition in which there is a leaky mitral valve. The mitral valve is the large valve between the two left chambers of the heart. When the large muscular ventricle contracts to pump blood, the mitral valve keeps that blood from flowing backward and back into the atrium. If there is too much regurgitation, the heart has to work harder. This eventually can cause heart failure. When your heart goes into failure, you do not feel well. You have shortness of breath (dyspnea) with exertion. The kidneys do not work as well so you may retain fluid. This is one of the reasons your lower legs and ankles may swell. You may have a rapid weight gain. In addition to this swelling, the fluid retention makes fluid back up in the lungs. This causes additional shortness of breath, which then makes the failure worse. The first sign you will usually recognize is shortness of breath with exertion (climbing stairs for example). You will also usually  get a rapid heartbeat. Upon discharge from this location, weigh yourself after arriving home. Record your weight at the same time every day as this will provide a record of your progress. As you get better, your weight will usually go down. Follow a low sodium (low salt) diet. You may notice that you get short of breath while sleeping. The heart actually has to work harder while you are lying down. This may also produce a night cough or make it necessary to sleep with two or more pillows. SYMPTOMS  You may have no symptoms if mitral regurgitation is mild. It may be discovered only during a routine exam by your caregiver when a heart murmur is heard. DIAGNOSIS  The best study for mitral regurgitation is the echocardiogram (ultrasound of the heart). This shows the cause of the MR and how bad it is. It also gives information about the left ventricle and atrium (the two heart chambers that are bridged by the mitral valve.) TREATMENT   Early MR may be treated with medications. If there are no medication allergies or problems, ACE Inhibitors are commonly used in the treatment of MR. Under treatment, these symptoms usually improve rapidly. Medications treat but will not cure or slow the progression of the MR. This is more dependent on the cause of the MR.  If MR becomes more severe, surgery may become necessary to repair or replace the valve. This is called open heart surgery.  There is no absolute age limitation to valve surgery. 46 year old people have had their valves replaced. The risk  of stroke and death is low with open heart surgery, but does increase a with age and other medical problems. Elderly patients that are otherwise healthy usually do well with valve replacement surgery.  A cardiac catheterization is usually done prior to valve surgery unless the patient is very young. This is done to check the health of coronary arteries. If there are blockages, a bypass can be done while the chest is open  for the valve replacement. If you are beginning to have symptoms from mitral regurgitation or are waiting for a surgical procedure to help you with this problem, following are some of the things you can do to help yourself while you are waiting for surgery or are simply putting off the surgery to see if it is needed. HOME CARE INSTRUCTIONS   Activity Level--- Your caregiver will help you determine what type of exercise program may be helpful. It is important to maintain strength and increase it if possible. Pace your activities and avoid shortness of breath or chest pain. Plan activities for at least an hour after meals or before eating. This allows your body to handle one activity at a time. Your caregiver can help advise you for activities.  Diet--- Maintain a low salt diet or as directed by your caregiver and eat a heart healthy diet. Get diet information from your caregiver or dietician. Remove your salt shaker and avoid adding salt to you foods. Measure the amounts of fluids you take in per day in cups and record these amounts.  Discharge Medications--- You may have been prescribed an ACE inhibitor or a beta blocker to take for your heart failure. Take either as directed as this improves your heart function and your survival. Ask your caregiver if being on statins (cholesterol lowering drugs) would be helpful.  Weight Monitoring---Weigh yourself today. When you get home, compare it to your scale and record your weight. Weigh twice per day and record these weights and try to weigh at the same time every day. It is best to weigh first thing in the morning, in your same clothes, after going to the bathroom and before eating or drinking anything. Place the scale on a hard surfaced floor. Bring these weights to your caregiver to be reviewed during your appointments.  Blood pressure monitoring should be done twice per week. You can get a home blood pressure cuff at your drugstore. Record these values and  bring them with you for your clinic visits. Notify your caregiver if you become dizzy or lightheaded upon standing up.  Be familiar with your medications--- If you have trouble remembering when you took them, write down times or set your medications out in advance for the day or the week to avoid problems. If you are on medications and do not remember if you have taken your medication, just skip it for that day unless your caregiver advises you otherwise. If you are on a diuretic (water pill), take these in the morning so you are not up all night going to the bathroom.  If you are currently a smoker, it is time to quit. Nicotine makes your heart work harder and is one of the leading causes of cardiac (heart) deaths. Do not leave without a smoking cessation plan or instructions on help available to quit smoking.  Immunization with influenza and pneumococcal vaccines may reduce the risk of respiratory infection.  Nonsteroidal anti-inflammatory drugs should not be used. They can cause sodium (salt) retention and also may hurt the action of  diuretics and ACE inhibitors.  Aldosterone Antagonists may have beneficial effects.  If you do not follow your diet and take your medications properly, this may rapidly lead to emergency care or hospitalization. Follow the advice of your caregiver.  What To Do If Symptoms Worsen--- If there are immediate problems go to the Emergency Department. This would include any symptoms which brought you in and which are getting worse rather than better. Call emergency services (911 in [place] for immediate care. DAILY PATH TO QUALITY LIVING  Monitor weight and record.  Monitor blood pressure and record.  Monitor fluid intake.  Monitor Sodium intake.  Monitor Activity Levels.  Take your medications.  Stop all use of nicotine.  Avoid alcohol.  Know when to call for help and do so. SEEK IMMEDIATE MEDICAL CARE IF:  Your weight increases by 03 lb/1.4 kg in 1 day or  05 lb/2.3 kg in a week, or as your caregiver suggests.  You notice increasing shortness of breath during rest, sleeping, or with activity, and which is unusual for you.  You develop chest pain.  You develop sweating or nausea which is unusual for you.  You notice increased swelling in your hands, feet, ankles or abdomen.  You have a feeling of fullness in your abdomen or develop nausea or loss of appetite.  You notice dizziness, blurred vision, headache, or unsteadiness. Make an appointment with your caregiver as directed for follow-up. MAKE SURE YOU:   Understand these instructions.  Will watch your condition.  Will get help right away if you are not doing well or get worse. Document Released: 05/25/2004 Document Revised: 05/30/2011 Document Reviewed: 05/04/2007 [place] [facility]Exit[facility] Patient Information 2014 [place]ExitCare, Maryland[place].

## 2013-08-21 NOTE — Progress Notes (Signed)
 HPI:   47-year-old gentleman presenting for initial evaluation of mitral regurgitation. He was seen by Dr. Bedsole last week for rectal bleeding. During her exam, she noticed a prominent heart murmur. A heart murmur had never been appreciated in the past. He was referred for an echocardiogram which demonstrated a possible flail segment of the posterior leaflet of the mitral valve with severe mitral regurgitation. Left ventricular function was normal.  The patient has no history of cardiac disease. He's never been told of a heart murmur in the past. He is physically active and has no exertional symptoms. He walks 3-4 miles several days per week. He walks at a pace of about a 14 minute mile. He specifically denies chest pain, shortness of breath, edema, or palpitations, orthopnea, or PND.  He has a history of high cholesterol. He's working on diet and exercise program and trying to manage this with lifestyle changes. He plans on having his cholesterol rechecked in a few months.  The patient is married with 3 children, ages 18, 14, and 10. He is originally from [place]. 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  Outpatient Encounter Prescriptions as of 08/21/2013  Medication Sig  . [DISCONTINUED] hydrocortisone (ANUSOL-HC) 25 MG suppository Place 1 suppository (25 mg total) rectally daily.    Review of patient's allergies indicates no known allergies.  Past Medical History  Diagnosis Date  . Gout     Past Surgical History  Procedure Laterality Date  . Hand surgery      R hand  . Knee surgery      L knee arthroscopy    History   Social History  . Marital Status: Married    Spouse Name: N/A    Number of Children: N/A  . Years of Education: N/A   Occupational History  . [employer]    Social History Main Topics  . Smoking status: Never Smoker   . Smokeless tobacco: Never Used  . Alcohol Use: Yes     Comment: occasional  . Drug Use: No  . Sexual Activity: Not on file    Other Topics Concern  . Not on file   Social History Narrative  . No narrative on file    Family history: His father had a myocardial infarction at age 52. A paternal uncle died of a myocardial infarction in his 50s.  ROS:  General: no fevers/chills/night sweats Eyes: no blurry vision, diplopia, or amaurosis ENT: no sore throat or hearing loss Resp: no cough, wheezing, or hemoptysis CV: no edema or palpitations GI: no abdominal pain, nausea, vomiting, diarrhea, or constipation GU: no dysuria, frequency, or hematuria Skin: no rash Neuro: no headache, numbness, tingling, or weakness of extremities Musculoskeletal: no joint pain or swelling Heme: no bleeding, DVT, or easy bruising Endo: no polydipsia or polyuria  BP 130/70  Pulse 71  Ht 5' 10" (1.778 m)  Wt 90.266 kg (199 lb)  BMI 28.55 kg/m2  PHYSICAL EXAM: Pt is alert and oriented, WD, WN, in no distress. HEENT: normal Neck: JVP normal. Carotid upstrokes normal without bruits. No thyromegaly. Lungs: equal expansion, clear bilaterally CV: Apex is discrete and nondisplaced, RRR with a grade 3-4/6 holosystolic murmur best heard at the LV apex Abd: soft, NT, +BS, no bruit, no hepatosplenomegaly Back: no CVA tenderness Ext: no C/C/E        DP/PT pulses intact and = Skin: warm and dry without rash Neuro: CNII-XII intact               Strength intact = bilaterally  EKG:  Normal sinus rhythm 71 beats per minute, within normal limits.  2D Echo: Study Conclusions  - Left ventricle: The cavity size was mildly dilated. Wall thickness was normal. Systolic function was normal. The estimated ejection fraction was in the range of 55% to 60%. - Mitral valve: Possible flail segment of the poserior leaflet with anteriorly directed MR Suggest TEE for further evaluation with 3D imaging There was severe regurgitation. - Left atrium: The atrium was moderately dilated. - Atrial septum: No defect or patent foramen ovale was  identified. - Pulmonary arteries: PA peak pressure: 31 mm Hg (S).  ASSESSMENT AND PLAN: 1. Severe mitral regurgitation, asymptomatic. I have recommended a transesophageal echo to better evaluate the mechanism of the patient's mitral regurgitation which appears to be Primary MR from a flail posterior leaflet segment. We discussed treatment considerations at length, including medical therapy and observation versus early surgical intervention with valve repair. Will obtain an exercise treadmill study to evaluate his exercise capacity and assess any potential functional limitation related to his mitral regurgitation. I will see him back in followup after his TEE and treadmill test are done.  2. Hypercholesterolemia. He has lipids followed through a program at his work. Total cholesterol was in the 260s. I suspect he'll require a statin drug and I would be in favor of this considering his strong family history of CAD. He will have followup lipids and LFTs in a few months after a period of diet and exercise.  Tonny Bollman 08/21/2013 2:50 PM

## 2013-08-22 ENCOUNTER — Telehealth: Payer: Self-pay | Admitting: Cardiovascular Disease

## 2013-08-22 NOTE — Telephone Encounter (Signed)
Follow up    Pt has a question about is it OK to run 2 or 3 miles 4x a week?  Please give pt a call.

## 2013-08-22 NOTE — Telephone Encounter (Signed)
Left message with Dr Earmon Phoenix response on the pt's voicemail.

## 2013-08-22 NOTE — Telephone Encounter (Signed)
Yes that's fine 

## 2013-08-26 ENCOUNTER — Encounter (HOSPITAL_COMMUNITY): Payer: Self-pay

## 2013-08-26 ENCOUNTER — Ambulatory Visit (HOSPITAL_COMMUNITY)
Admission: RE | Admit: 2013-08-26 | Discharge: 2013-08-26 | Disposition: A | Discharge status: Home / Self Care | Payer: BC Managed Care – PPO | Source: Ambulatory Visit | Attending: Cardiology | Admitting: Cardiology

## 2013-08-26 ENCOUNTER — Encounter (HOSPITAL_COMMUNITY)
Admission: RE | Disposition: A | Discharge status: Home / Self Care | Payer: Self-pay | Source: Ambulatory Visit | Attending: Cardiology

## 2013-08-26 DIAGNOSIS — I059 Rheumatic mitral valve disease, unspecified: Secondary | ICD-10-CM

## 2013-08-26 DIAGNOSIS — Z8249 Family history of ischemic heart disease and other diseases of the circulatory system: Secondary | ICD-10-CM | POA: Insufficient documentation

## 2013-08-26 DIAGNOSIS — E7800 Pure hypercholesterolemia, unspecified: Secondary | ICD-10-CM | POA: Insufficient documentation

## 2013-08-26 DIAGNOSIS — I341 Nonrheumatic mitral (valve) prolapse: Secondary | ICD-10-CM

## 2013-08-26 DIAGNOSIS — I340 Nonrheumatic mitral (valve) insufficiency: Secondary | ICD-10-CM | POA: Insufficient documentation

## 2013-08-26 HISTORY — DX: Nonrheumatic mitral (valve) prolapse: I34.1

## 2013-08-26 HISTORY — PX: TEE WITHOUT CARDIOVERSION: SHX5443

## 2013-08-26 HISTORY — DX: Nonrheumatic mitral (valve) insufficiency: I34.0

## 2013-08-26 SURGERY — ECHOCARDIOGRAM, TRANSESOPHAGEAL
Anesthesia: Moderate Sedation

## 2013-08-26 MED ORDER — FENTANYL CITRATE 0.05 MG/ML IJ SOLN
INTRAMUSCULAR | Status: AC
  Filled 2013-08-26: qty 2

## 2013-08-26 MED ORDER — MIDAZOLAM HCL 5 MG/ML IJ SOLN
INTRAMUSCULAR | Status: AC
  Filled 2013-08-26: qty 2

## 2013-08-26 MED ORDER — BUTAMBEN-TETRACAINE-BENZOCAINE 2-2-14 % EX AERO
INHALATION_SPRAY | CUTANEOUS | Status: DC | PRN
  Administered 2013-08-26: 2 via TOPICAL

## 2013-08-26 MED ORDER — SODIUM CHLORIDE 0.9 % IV SOLN: INTRAVENOUS | Status: DC

## 2013-08-26 MED ORDER — FENTANYL CITRATE 0.05 MG/ML IJ SOLN
INTRAMUSCULAR | Status: DC | PRN
  Administered 2013-08-26 (×3): 25 ug via INTRAVENOUS

## 2013-08-26 MED ORDER — MIDAZOLAM HCL 10 MG/2ML IJ SOLN
INTRAMUSCULAR | Status: DC | PRN
  Administered 2013-08-26: 1 mg via INTRAVENOUS
  Administered 2013-08-26 (×3): 2 mg via INTRAVENOUS

## 2013-08-26 MED ORDER — SODIUM CHLORIDE 0.9 % IV SOLN
INTRAVENOUS | Status: DC
Start: 2013-08-26 — End: 2013-08-26
  Administered 2013-08-26: 09:00:00 via INTRAVENOUS

## 2013-08-26 NOTE — CV Procedure (Signed)
     Transesophageal Echocardiogram Note  Cohl Wysinger [id number] 01-09-1966  Procedure: Transesophageal Echocardiogram Indications: Mitral regurgitation  Procedure Details Consent: Obtained Time Out: Verified patient identification, verified procedure, site/side was marked, verified correct patient position, special equipment/implants available, Radiology Safety Procedures followed,  medications/allergies/relevent history reviewed, required imaging and test results available.  Performed  Medications: Fentanyl: 75 mcg Versed: 7 mg  Left Ventrical:  The cavity size was mildly dilated. Wall thickness was normal. Systolic function was low normal estimated at 50%.   Mitral Valve: There is a significant prolapse of the P2 scallop of the posterior mitral valve leaflet associated with severe mitral regurgitation. The jest is directed anteriorly. There is mild reversal of flow in the pulmonary veins.  Aortic Valve: Structurally normal, no AI.  Tricuspid Valve: Trace TR.   Pulmonic Valve: Trace PR.  Left Atrium/ Left atrial appendage: Moderately dilated, no LA/LAA thrombus, normal velocities.   Atrial septum: no ASD or PFO with colo Doppler.  Aorta: Mild AS plague.   Complications: No apparent complications Patient did tolerate procedure well.  [name], MD, [facility] 08/26/2013, 9:40 AM

## 2013-08-26 NOTE — Discharge Instructions (Addendum)
Moderate Sedation, Adult °Moderate sedation is given to help you relax or even sleep through a procedure. You may remain sleepy, be clumsy, or have poor balance for several hours following this procedure. Arrange for a responsible adult, family member, or friend to take you home. A responsible adult should stay with you for at least 24 hours or until the medicines have worn off. °· Do not participate in any activities where you could become injured for the next 24 hours, or until you feel normal again. Do not: °· Drive. °· Swim. °· Ride a bicycle. °· Operate heavy machinery. °· Cook. °· Use power tools. °· Climb ladders. °· Work at heights. °· Do not make important decisions or sign legal documents until you are improved. °· Vomiting may occur if you eat too soon. When you can drink without vomiting, try water, juice, or soup. Try solid foods if you feel little or no nausea. °· Only take over-the-counter or prescription medications for pain, discomfort, or fever as directed by your caregiver.If pain medications have been prescribed for you, ask your caregiver how soon it is safe to take them. °· Make sure you and your family fully understands everything about the medication given to you. Make sure you understand what side effects may occur. °· You should not drink alcohol, take sleeping pills, or medications that cause drowsiness for at least 24 hours. °· If you smoke, do not smoke alone. °· If you are feeling better, you may resume normal activities 24 hours after receiving sedation. °· Keep all appointments as scheduled. Follow all instructions. °· Ask questions if you do not understand. °SEEK MEDICAL CARE IF:  °· Your skin is pale or bluish in color. °· You continue to feel sick to your stomach (nauseous) or throw up (vomit). °· Your pain is getting worse and not helped by medication. °· You have bleeding or swelling. °· You are still sleepy or feeling clumsy after 24 hours. °SEEK IMMEDIATE MEDICAL CARE IF:   °· You develop a rash. °· You have difficulty breathing. °· You develop any type of allergic problem. °· You have a fever. °Document Released: 11/30/2000 Document Revised: 05/30/2011 Document Reviewed: 11/12/2012 °ExitCare® Patient Information ©2014 [employer] LLC. ° °

## 2013-08-26 NOTE — Progress Notes (Signed)
  Echocardiogram Echocardiogram Transesophageal has been performed.  Lawrence Underwood 08/26/2013, 10:51 AM

## 2013-08-26 NOTE — Interval H&P Note (Signed)
History and Physical Interval Note:  08/26/2013 9:38 AM  Lawrence Underwood  has presented today for surgery, with the diagnosis of MR  The various methods of treatment have been discussed with the patient and family. After consideration of risks, benefits and other options for treatment, the patient has consented to  Procedure(s): TRANSESOPHAGEAL ECHOCARDIOGRAM (TEE) (N/A) as a surgical intervention .  The patient's history has been reviewed, patient examined, no change in status, stable for surgery.  I have reviewed the patient's chart and labs.  Questions were answered to the patient's satisfaction.     [name]

## 2013-08-26 NOTE — H&P (View-Only) (Signed)
 HPI:   48-year-old gentleman presenting for initial evaluation of mitral regurgitation. He was seen by Dr. Bedsole last week for rectal bleeding. During her exam, she noticed a prominent heart murmur. A heart murmur had never been appreciated in the past. He was referred for an echocardiogram which demonstrated a possible flail segment of the posterior leaflet of the mitral valve with severe mitral regurgitation. Left ventricular function was normal.  The patient has no history of cardiac disease. He's never been told of a heart murmur in the past. He is physically active and has no exertional symptoms. He walks 3-4 miles several days per week. He walks at a pace of about a 14 minute mile. He specifically denies chest pain, shortness of breath, edema, or palpitations, orthopnea, or PND.  He has a history of high cholesterol. He's working on diet and exercise program and trying to manage this with lifestyle changes. He plans on having his cholesterol rechecked in a few months.  The patient is married with 3 children, ages 18, 14, and 10. He is originally from [place]. 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  Outpatient Encounter Prescriptions as of 08/21/2013  Medication Sig  . [DISCONTINUED] hydrocortisone (ANUSOL-HC) 25 MG suppository Place 1 suppository (25 mg total) rectally daily.    Review of patient's allergies indicates no known allergies.  Past Medical History  Diagnosis Date  . Gout     Past Surgical History  Procedure Laterality Date  . Hand surgery      R hand  . Knee surgery      L knee arthroscopy    History   Social History  . Marital Status: Married    Spouse Name: N/A    Number of Children: N/A  . Years of Education: N/A   Occupational History  . [employer]    Social History Main Topics  . Smoking status: Never Smoker   . Smokeless tobacco: Never Used  . Alcohol Use: Yes     Comment: occasional  . Drug Use: No  . Sexual Activity: Not on file    Other Topics Concern  . Not on file   Social History Narrative  . No narrative on file    Family history: His father had a myocardial infarction at age 52. A paternal uncle died of a myocardial infarction in his 50s.  ROS:  General: no fevers/chills/night sweats Eyes: no blurry vision, diplopia, or amaurosis ENT: no sore throat or hearing loss Resp: no cough, wheezing, or hemoptysis CV: no edema or palpitations GI: no abdominal pain, nausea, vomiting, diarrhea, or constipation GU: no dysuria, frequency, or hematuria Skin: no rash Neuro: no headache, numbness, tingling, or weakness of extremities Musculoskeletal: no joint pain or swelling Heme: no bleeding, DVT, or easy bruising Endo: no polydipsia or polyuria  BP 130/70  Pulse 71  Ht 5' 10" (1.778 m)  Wt 90.266 kg (199 lb)  BMI 28.55 kg/m2  PHYSICAL EXAM: Pt is alert and oriented, WD, WN, in no distress. HEENT: normal Neck: JVP normal. Carotid upstrokes normal without bruits. No thyromegaly. Lungs: equal expansion, clear bilaterally CV: Apex is discrete and nondisplaced, RRR with a grade 3-4/6 holosystolic murmur best heard at the LV apex Abd: soft, NT, +BS, no bruit, no hepatosplenomegaly Back: no CVA tenderness Ext: no C/C/E        DP/PT pulses intact and = Skin: warm and dry without rash Neuro: CNII-XII intact               Strength intact = bilaterally  EKG:  Normal sinus rhythm 71 beats per minute, within normal limits.  2D Echo: Study Conclusions  - Left ventricle: The cavity size was mildly dilated. Wall thickness was normal. Systolic function was normal. The estimated ejection fraction was in the range of 55% to 60%. - Mitral valve: Possible flail segment of the poserior leaflet with anteriorly directed MR Suggest TEE for further evaluation with 3D imaging There was severe regurgitation. - Left atrium: The atrium was moderately dilated. - Atrial septum: No defect or patent foramen ovale was  identified. - Pulmonary arteries: PA peak pressure: 31 mm Hg (S).  ASSESSMENT AND PLAN: 1. Severe mitral regurgitation, asymptomatic. I have recommended a transesophageal echo to better evaluate the mechanism of the patient's mitral regurgitation which appears to be Primary MR from a flail posterior leaflet segment. We discussed treatment considerations at length, including medical therapy and observation versus early surgical intervention with valve repair. Will obtain an exercise treadmill study to evaluate his exercise capacity and assess any potential functional limitation related to his mitral regurgitation. I will see him back in followup after his TEE and treadmill test are done.  2. Hypercholesterolemia. He has lipids followed through a program at his work. Total cholesterol was in the 260s. I suspect he'll require a statin drug and I would be in favor of this considering his strong family history of CAD. He will have followup lipids and LFTs in a few months after a period of diet and exercise.  Tonny Bollman 08/21/2013 2:50 PM

## 2013-08-27 ENCOUNTER — Encounter (HOSPITAL_COMMUNITY): Payer: Self-pay | Admitting: Cardiology

## 2013-08-29 ENCOUNTER — Telehealth: Payer: Self-pay | Admitting: Cardiovascular Disease

## 2013-08-29 DIAGNOSIS — I340 Nonrheumatic mitral (valve) insufficiency: Secondary | ICD-10-CM

## 2013-08-29 NOTE — Telephone Encounter (Signed)
Reviewed findings with patient. Will refer to Dr.Owen. He may need preoperative right and left heart catheterization, but advised I would like him to see Dr. Cornelius Moras first.

## 2013-08-29 NOTE — Telephone Encounter (Signed)
Pt aware of appointment with Dr Cornelius Moras.  The pt is planning on signing up for My Chart tonight and would like me to seen an appointment reminder to him through this system.

## 2013-08-29 NOTE — Telephone Encounter (Signed)
New message  Pt states that he was advised after his recent TEE to schedule surgery for Mitral Valve Repair with Dr. Cornelius Moras Pt states he hasn't heard anything else about it// Requests a call back to discuss further

## 2013-09-04 ENCOUNTER — Institutional Professional Consult (permissible substitution) (INDEPENDENT_AMBULATORY_CARE_PROVIDER_SITE_OTHER): Payer: BC Managed Care – PPO | Admitting: Thoracic Surgery (Cardiothoracic Vascular Surgery)

## 2013-09-04 ENCOUNTER — Encounter: Payer: Self-pay | Admitting: Thoracic Surgery (Cardiothoracic Vascular Surgery)

## 2013-09-04 VITALS — BP 135/79 | HR 62 | Resp 16 | Ht 70.0 in | Wt 198.0 lb

## 2013-09-04 DIAGNOSIS — R011 Cardiac murmur, unspecified: Secondary | ICD-10-CM

## 2013-09-04 DIAGNOSIS — I340 Nonrheumatic mitral (valve) insufficiency: Secondary | ICD-10-CM

## 2013-09-04 DIAGNOSIS — I059 Rheumatic mitral valve disease, unspecified: Secondary | ICD-10-CM

## 2013-09-04 NOTE — Progress Notes (Signed)
[address]       [name] [address]             [phone]     CARDIOTHORACIC SURGERY CONSULTATION REPORT  Referring Halyn Flaugher is Tonny Bollman, MD PCP is [name], MD  Chief Complaint  Patient presents with  . Heart Murmur    severe MR per ECHO/TEE.Marland Kitcheneval for MVReplacement    HPI:  Patient is a 48 year old otherwise healthy white male who was recently discovered to have a heart murmur on physical exam by his primary care physician. An echocardiogram was performed demonstrating the presence of mitral valve prolapse with mitral regurgitation. The patient was seen in consultation by Dr. Excell Seltzer and subsequently referred for transesophageal echocardiogram. This confirmed the presence of mitral valve prolapse with a large flail segment of the posterior leaflet and severe mitral regurgitation. The patient has been referred for surgical consultation.  The patient is an otherwise healthy married father of 3 teenage children who remains active physically. He works in Armed forces operational officer for [employer].  He exercises on a regular basis including walking with occasional light jogging. He denies any development of symptoms of exertional shortness of breath or any significant change in his exercise tolerance. He reports occasional transient atypical symptoms of chest pain that are not related to physical exertion. He occasionally gets palpitations when he feels stress. He denies any history of PND, orthopnea, or lower extremity edema. He has an occasional non-productive cough, but this is not persistent or chronic.  Past Medical History  Diagnosis Date  . Gout   . Mitral regurgitation due to cusp prolapse 08/26/2013    Past Surgical History  Procedure Laterality Date  . Hand surgery      R hand  . Knee surgery      L knee arthroscopy  . Tee without cardioversion N/A 08/26/2013    Procedure: TRANSESOPHAGEAL ECHOCARDIOGRAM (TEE);  Surgeon: [name], MD;  Location: [facility]  ENDOSCOPY;  Service: Cardiovascular;  Laterality: N/A;    No family history on file.  History   Social History  . Marital Status: Married    Spouse Name: N/A    Number of Children: N/A  . Years of Education: N/A   Occupational History  . [employer]    Social History Main Topics  . Smoking status: Never Smoker   . Smokeless tobacco: Never Used  . Alcohol Use: Yes     Comment: occasional  . Drug Use: No  . Sexual Activity: Not on file   Other Topics Concern  . Not on file   Social History Narrative  . No narrative on file    No current outpatient prescriptions on file.   No current facility-administered medications for this visit.    No Known Allergies    Review of Systems:   General:  normal appetite, normal energy, no weight gain, no weight loss, no fever  Cardiac:  no chest pain with exertion, occasional transient atypical chest pain at rest, no SOB with exertion, no resting SOB, no PND, no orthopnea, occasional palpitations, no arrhythmia, no atrial fibrillation, no LE edema, no dizzy spells, no syncope  Respiratory:  no shortness of breath, no home oxygen, no productive cough, occasional dry cough, no bronchitis, no wheezing, no hemoptysis, no asthma, no pain with inspiration or cough, no sleep apnea, no CPAP at night  GI:   no difficulty swallowing, no reflux, no frequent heartburn, no hiatal hernia, no abdominal pain,  no constipation, no diarrhea, no hematochezia, no hematemesis, no melena  GU:   no dysuria,  no frequency, no urinary tract infection, no hematuria, no enlarged prostate, no kidney stones, no kidney disease  Vascular:  no pain suggestive of claudication, no pain in feet, no leg cramps, no varicose veins, no DVT, no non-healing foot ulcer  Neuro:   no stroke, no TIA's, no seizures, no headaches, no temporary blindness one eye,  no slurred speech, no peripheral neuropathy, no chronic pain, no instability of gait, no memory/cognitive  dysfunction  Musculoskeletal: no arthritis, no joint swelling, no myalgias, no difficulty walking, normal mobility   Skin:   no rash, no itching, no skin infections, no pressure sores or ulcerations  Psych:   no anxiety, no depression, no nervousness, no unusual recent stress  Eyes:   no blurry vision, no floaters, no recent vision changes, no wears glasses or contacts  ENT:   no hearing loss, no loose or painful teeth, no dentures, last saw dentist several years ago  Hematologic:  no easy bruising, no abnormal bleeding, no clotting disorder, no frequent epistaxis  Endocrine:  no diabetes, does not check CBG's at home     Physical Exam:   BP 135/79  Pulse 62  Resp 16  Ht 5\' 10"  (1.778 m)  Wt 198 lb (89.812 kg)  BMI 28.41 kg/m2  SpO2 98%  General:  Healthy,  well-appearing  HEENT:  Unremarkable   Neck:   no JVD, no bruits, no adenopathy   Chest:   clear to auscultation, symmetrical breath sounds, no wheezes, no rhonchi   CV:   RRR, grade IV/VI holosystolic murmur   Abdomen:  soft, non-tender, no masses   Extremities:  warm, well-perfused, pulses palpable, no LE edema  Rectal/GU  Deferred  Neuro:   Grossly non-focal and symmetrical throughout  Skin:   Clean and dry, no rashes, no breakdown   Diagnostic Tests:  Transthoracic Echocardiography  Patient: Jola BabinskiGeletko, Don MR #: [id number] Study Date: 08/19/2013 Gender: M Age: 54 Height: 177.8 cm Weight: 89.4 kg BSA: 2.12 m^2 Pt. Status: Room:  ATTENDING Ermalene SearingBedsole, Amy E Milagros ReapDERING Bedsole, Amy E REFERRING Ermalene SearingBedsole, Amy E SONOGRAPHER [facility] PERFORMING [facility]  cc:  ------------------------------------------------------------------- LV EF: 55% - 60%  ------------------------------------------------------------------- Indications: Murmur 785.2.  ------------------------------------------------------------------- History: PMH: Acquired from the patient and from the patient&'s chart. Murmur. Risk factors:  Dyslipidemia.  ------------------------------------------------------------------- Study Conclusions  - Left ventricle: The cavity size was mildly dilated. Wall thickness was normal. Systolic function was normal. The estimated ejection fraction was in the range of 55% to 60%. - Mitral valve: Possible flail segment of the poserior leaflet with anteriorly directed MR Suggest TEE for further evaluation with 3D imaging There was severe regurgitation. - Left atrium: The atrium was moderately dilated. - Atrial septum: No defect or patent foramen ovale was identified. - Pulmonary arteries: PA peak pressure: 31 mm Hg (S).  -------------------------------------------------------------------  ------------------------------------------------------------------- Left ventricle: The cavity size was mildly dilated. Wall thickness was normal. Systolic function was normal. The estimated ejection fraction was in the range of 55% to 60%.  ------------------------------------------------------------------- Aortic valve: Structurally normal valve. Cusp separation was normal. Doppler: Transvalvular velocity was within the normal range. There was no stenosis. There was no regurgitation.  ------------------------------------------------------------------- Mitral valve: Possible flail segment of the poserior leaflet with anteriorly directed MR Suggest TEE for further evaluation with 3D imaging Doppler: There was severe regurgitation. Peak gradient (D): 9 mm Hg.  ------------------------------------------------------------------- Left atrium: The atrium was moderately  dilated.  ------------------------------------------------------------------- Atrial septum: No defect or patent foramen ovale was identified.  ------------------------------------------------------------------- Right ventricle: The cavity size was normal. Wall thickness was normal. Systolic function was  normal.  ------------------------------------------------------------------- Pulmonic valve: Structurally normal valve. Cusp separation was normal. Doppler: Transvalvular velocity was within the normal range. There was mild regurgitation.  ------------------------------------------------------------------- Tricuspid valve: Structurally normal valve. Leaflet separation was normal. Doppler: Transvalvular velocity was within the normal range. There was mild regurgitation.  ------------------------------------------------------------------- Right atrium: The atrium was normal in size.  ------------------------------------------------------------------- Pericardium: The pericardium was normal in appearance.  ------------------------------------------------------------------- Prepared and Electronically Authenticated by  Charlton Haws, M.D. 2015-06-01T09:16:08  ------------------------------------------------------------------- Measurements  Left ventricle Value Reference LV ID, ED, PLAX chordal (H) 60 mm 43 - 52 LV ID, ES, PLAX chordal (N) 38 mm 23 - 38 LV fx shortening, PLAX chordal (N) 37 % >=29 LV PW thickness, ED 13 mm --------- IVS/LV PW ratio, ED (N) 0.77 <=1.3 Stroke volume, 2D 68 ml --------- Stroke volume/bsa, 2D 32 ml/m^2 --------- LV e&', lateral 13.3 cm/s --------- LV E/e&', lateral 11.05 --------- LV e&', medial 7.14 cm/s --------- LV E/e&', medial 20.59 --------- LV e&', average 10.22 cm/s --------- LV E/e&', average 14.38 ---------  Ventricular septum Value Reference IVS thickness, ED 10 mm ---------  LVOT Value Reference LVOT ID, S 21 mm --------- LVOT area 3.46 cm^2 --------- LVOT ID 21 mm --------- LVOT VTI, S 19.4 cm --------- Stroke volume (SV), LVOT DP 67.2 ml --------- Stroke index (SV/bsa), LVOT DP 31.7 ml/m^2 ---------  Aorta Value Reference Aortic root ID, ED 34 mm ---------  Left atrium Value Reference LA ID, A-P, ES 44 mm --------- LA  ID/bsa, A-P (N) 2.08 cm/m^2 <=2.2  Mitral valve Value Reference Mitral E-wave peak velocity 147 cm/s --------- Mitral A-wave peak velocity 66.5 cm/s --------- Mitral deceleration time (N) 190 ms 150 - 230 Mitral peak gradient, D 9 mm Hg --------- Mitral E/A ratio, peak 2.2 --------- Mitral maximal regurg velocity, 419 cm/s --------- PISA Mitral regurg VTI, PISA 123 cm ---------  Pulmonary arteries Value Reference PA pressure, S, DP (H) 31 mm Hg <=30  Tricuspid valve Value Reference Tricuspid regurg peak velocity 263 cm/s --------- Tricuspid peak RV-RA gradient 28 mm Hg --------- Tricuspid maximal regurg velocity, 263 cm/s --------- PISA  Systemic veins Value Reference Estimated CVP 3 mm Hg ---------  Right ventricle Value Reference RV pressure, S, DP (H) 31 mm Hg <=30 RV s&', lateral, S 18 cm/s ---------  Legend: (L) and (H) mark values outside specified reference range.  (N) marks values inside specified reference range.        Transesophageal Echocardiography  Patient: Luman, Holway MR #: [id number] Study Date: 08/26/2013 Gender: M Age: 85 Height: 177.8 cm Weight: 90 kg BSA: 2.13 m^2 Pt. Status: Room:  SONOGRAPHER Dewitt Hoes, RDCS SONOGRAPHER Arvil Chaco ADMITTING Tobias Alexander, M.D. ATTENDING Tobias Alexander, M.D. ORDERING Tobias Alexander, M.D. PERFORMING Tobias Alexander, M.D.  cc:  ------------------------------------------------------------------- LV EF: 50% - 55%  ------------------------------------------------------------------- Indications: Mitral regurgitation 424.0.  ------------------------------------------------------------------- Study Conclusions  - Left ventricle: The cavity size was mildly dilated. Systolic function was low normal. The estimated ejection fraction was 50 %. Wall motion was normal; there were no regional wall motion abnormalities. - Aortic valve: Structurally normal valve. Trileaflet; normal thickness  leaflets. There was no significant regurgitation. - Aortic root: The aortic root was not dilated. - Ascending aorta: The ascending aorta was normal in size. - Mitral valve: Mitral valve appears myxomatous. There is a severe prolapse of the  P2 scallop of the posterior mitral valve leaflet with associated severe mitral regurgitation. The regurgitation jet is directed anteriorly. There is mild systolic flow reversal in the right pulmonary veins. - Left atrium: The atrium was moderately dilated. No evidence of thrombus in the atrial cavity or appendage. No evidence of thrombus in the atrial cavity or appendage. No evidence of thrombus in the appendage. - Right ventricle: Systolic function was normal. - Right atrium: No evidence of thrombus in the atrial cavity or appendage. - Tricuspid valve: There was mild regurgitation.  Impressions:  - Severe P2 prolapse, severe mitral regurgitation. Low normal LVEF 50%.  Diagnostic transesophageal echocardiography. 2D and color Doppler. Birthdate: Patient birthdate: [birth year]. Age: Patient is 48 yr old. Sex: Gender: male. Height: Height: 177.8 cm. Height: 70 in. Weight: Weight: 90 kg. Weight: 198 lb. Body mass index: BMI: 28.5 kg/m^2. Body surface area: BSA: 2.13 m^2. Blood pressure: 150/90 Patient status: Outpatient. Study date: Study date: 08/26/2013. Study time: 09:53 AM. Location: Endoscopy.  -------------------------------------------------------------------  ------------------------------------------------------------------- Left ventricle: The cavity size was mildly dilated. Systolic function was low normal. The estimated ejection fraction was 50 %. Wall motion was normal; there were no regional wall motion abnormalities.  ------------------------------------------------------------------- Aortic valve: Structurally normal valve. Trileaflet; normal thickness leaflets. Cusp separation was normal. Doppler: There was no significant  regurgitation.  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 The aorta was normal, not dilated, and non-diseased. There was no atheroma. There was no evidence for dissection. Aortic root: The aortic root was not dilated. Ascending aorta: The ascending aorta was normal in size. Aortic arch: The aortic arch was normal in size. Descending aorta: The descending aorta was normal in size.  ------------------------------------------------------------------- Mitral valve: There is a severe prolapse of the P2 scallop of the posterior mitral valve leaflet with associated severe mitral regurgitation. The regurgitation jet is directed anteriorly. There is mild systolic flow reversal in the right pulmonary veins.  ------------------------------------------------------------------- Left atrium: The atrium was moderately dilated. No evidence of thrombus in the atrial cavity or appendage. No evidence of thrombus in the atrial cavity or appendage. No evidence of thrombus in the appendage. The appendage was morphologically a left appendage, multilobulated, and of normal size. Emptying velocity was normal.  ------------------------------------------------------------------- Right ventricle: The cavity size was normal. Wall thickness was normal. Systolic function was normal.  ------------------------------------------------------------------- Pulmonic valve: Structurally normal valve.  ------------------------------------------------------------------- Tricuspid valve: Structurally normal valve. Leaflet separation was normal. Doppler: There was mild regurgitation.  ------------------------------------------------------------------- Pulmonary artery: The main pulmonary artery was normal-sized.  ------------------------------------------------------------------- Right atrium: The  atrium was normal in size. No evidence of thrombus in the atrial cavity or appendage. The appendage was morphologically a right appendage.  ------------------------------------------------------------------- Pericardium: There was no pericardial effusion.  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 The aorta was normal, not dilated, and non-diseased.  ------------------------------------------------------------------- Prepared and Electronically Authenticated by  Tobias Alexander, M.D. 2015-06-08T12:06:27     Impression:  The patient has mitral valve prolapse with a large flail segment of the posterior leaflet and stage C chronic primary mitral regurgitation.  He remains asymptomatic.  Left ventricular size is mildly enlarged and ejection fraction is slightly reduced at 50%.  He is otherwise healthy although he does have hyperlipidemia and a strong family history of coronary artery disease.  Based upon review the patient's transesophageal echocardiogram I feel there is greater than 95% likelihood of successful and  durable mitral valve repair, and risks associated with surgery would be expected to be very low.  He will likely be an excellent candidate for minimally invasive approach for surgery, although diagnostic cardiac catheterization has not yet been performed.   Plan:  The patient was counseled at length regarding the indications, risks and potential benefits of mitral valve repair.  The rationale for elective surgery has been explained, including a comparison between surgery and continued medical therapy with close follow-up.  The likelihood of successful and durable valve repair has been discussed with particular reference to the findings of their recent echocardiogram.  Based upon these findings and previous experience, I  have quoted them a greater than 95 percent likelihood of successful valve repair.   The patient is eager to proceed with surgery in the relatively near future, although he does have something important on his schedule at work in early August that he hopes to participate in. Under the circumstances I think it would be more than reasonable to proceed with diagnostic cardiac catheterization in the near future and tentatively plan for elective surgery when it is convenient for his schedule.  The patient will return for follow up in 4 weeks to review the results of his cath and make final plans for surgery.    I spent in excess of 90 minutes during the conduct of this office consultation and >50% of this time involved direct face-to-face encounter with the patient for counseling and/or coordination of their care.   Salvatore Decent. Cornelius Moras, MD 09/04/2013 3:03 PM

## 2013-09-05 ENCOUNTER — Telehealth: Payer: Self-pay | Admitting: Cardiovascular Disease

## 2013-09-05 NOTE — Telephone Encounter (Signed)
I spoke with the Lawrence Underwood and made him aware that Dr Excell Seltzer felt like his lightheadedness could be related to anxiety and that he is more aware of how he is feeling since diagnosis. When I made the Lawrence Underwood aware of this information he said that he has had episodic lightheadedness for the past year. I also advised the Lawrence Underwood to remain well hydrated. The Lawrence Underwood would like to schedule cardiac catheterization on 09/18/13 and send pre-procedure instructions through My Chart.

## 2013-09-05 NOTE — Telephone Encounter (Signed)
New message  Pt called states that he has currently felt light headed and requests a call back from the nurse to discuss. He would also like to discuss a possible CATH. Please call

## 2013-09-13 ENCOUNTER — Encounter (HOSPITAL_COMMUNITY): Payer: Self-pay

## 2013-09-18 ENCOUNTER — Encounter (HOSPITAL_COMMUNITY)
Admission: RE | Disposition: A | Discharge status: Home / Self Care | Payer: Self-pay | Source: Ambulatory Visit | Attending: Cardiovascular Disease

## 2013-09-18 ENCOUNTER — Ambulatory Visit (HOSPITAL_COMMUNITY)
Admission: RE | Admit: 2013-09-18 | Discharge: 2013-09-18 | Disposition: A | Discharge status: Home / Self Care | Payer: BC Managed Care – PPO | Source: Ambulatory Visit | Attending: Cardiovascular Disease | Admitting: Cardiovascular Disease

## 2013-09-18 DIAGNOSIS — Z8249 Family history of ischemic heart disease and other diseases of the circulatory system: Secondary | ICD-10-CM | POA: Insufficient documentation

## 2013-09-18 DIAGNOSIS — I2510 Atherosclerotic heart disease of native coronary artery without angina pectoris: Secondary | ICD-10-CM

## 2013-09-18 DIAGNOSIS — I059 Rheumatic mitral valve disease, unspecified: Secondary | ICD-10-CM | POA: Insufficient documentation

## 2013-09-18 DIAGNOSIS — E7800 Pure hypercholesterolemia, unspecified: Secondary | ICD-10-CM | POA: Insufficient documentation

## 2013-09-18 HISTORY — DX: Atherosclerotic heart disease of native coronary artery without angina pectoris: I25.10

## 2013-09-18 HISTORY — PX: LEFT AND RIGHT HEART CATHETERIZATION WITH CORONARY ANGIOGRAM: SHX5449

## 2013-09-18 HISTORY — PX: CARDIAC CATHETERIZATION: SHX172

## 2013-09-18 LAB — CBC
HCT: 46.9 % (ref 39.0–52.0)
Hemoglobin: 16.7 g/dL (ref 13.0–17.0)
MCH: 29 pg (ref 26.0–34.0)
MCHC: 35.6 g/dL (ref 30.0–36.0)
MCV: 81.6 fL (ref 78.0–100.0)
PLATELETS: 258 10*3/uL (ref 150–400)
RBC: 5.75 MIL/uL (ref 4.22–5.81)
RDW: 12.4 % (ref 11.5–15.5)
WBC: 5 10*3/uL (ref 4.0–10.5)

## 2013-09-18 LAB — POCT I-STAT 3, ART BLOOD GAS (G3+)
Acid-base deficit: 2 mmol/L (ref 0.0–2.0)
Bicarbonate: 22.7 mEq/L (ref 20.0–24.0)
O2 Saturation: 97 %
PO2 ART: 94 mmHg (ref 80.0–100.0)
TCO2: 24 mmol/L (ref 0–100)
pCO2 arterial: 39.9 mmHg (ref 35.0–45.0)
pH, Arterial: 7.364 (ref 7.350–7.450)

## 2013-09-18 LAB — POCT I-STAT 3, VENOUS BLOOD GAS (G3P V)
Acid-base deficit: 1 mmol/L (ref 0.0–2.0)
Bicarbonate: 23.7 mEq/L (ref 20.0–24.0)
Bicarbonate: 25.6 mEq/L — ABNORMAL HIGH (ref 20.0–24.0)
O2 Saturation: 70 %
O2 Saturation: 79 %
PO2 VEN: 37 mmHg (ref 30.0–45.0)
TCO2: 25 mmol/L (ref 0–100)
TCO2: 27 mmol/L (ref 0–100)
pCO2, Ven: 39.2 mmHg — ABNORMAL LOW (ref 45.0–50.0)
pCO2, Ven: 43 mmHg — ABNORMAL LOW (ref 45.0–50.0)
pH, Ven: 7.383 — ABNORMAL HIGH (ref 7.250–7.300)
pH, Ven: 7.39 — ABNORMAL HIGH (ref 7.250–7.300)
pO2, Ven: 44 mmHg (ref 30.0–45.0)

## 2013-09-18 LAB — BASIC METABOLIC PANEL
ANION GAP: 15 (ref 5–15)
BUN: 18 mg/dL (ref 6–23)
CO2: 24 meq/L (ref 19–32)
CREATININE: 1.05 mg/dL (ref 0.50–1.35)
Calcium: 9.4 mg/dL (ref 8.4–10.5)
Chloride: 103 mEq/L (ref 96–112)
GFR calc Af Amer: >90 mL/min (ref >90)
GFR calc non Af Amer: 84 mL/min — ABNORMAL LOW (ref >90)
Glucose, Bld: 102 mg/dL — ABNORMAL HIGH (ref 70–99)
Potassium: 4.2 mEq/L (ref 3.7–5.3)
SODIUM: 142 meq/L (ref 137–147)

## 2013-09-18 LAB — PROTIME-INR
INR: 0.96 (ref 0.00–1.49)
PROTHROMBIN TIME: 12.8 s (ref 11.6–15.2)

## 2013-09-18 SURGERY — LEFT AND RIGHT HEART CATHETERIZATION WITH CORONARY ANGIOGRAM
Anesthesia: LOCAL

## 2013-09-18 MED ORDER — SODIUM CHLORIDE 0.9 % IJ SOLN: 3.0000 mL | INTRAMUSCULAR | Status: DC | PRN

## 2013-09-18 MED ORDER — ACETAMINOPHEN 325 MG PO TABS: 650.0000 mg | ORAL_TABLET | ORAL | Status: DC | PRN

## 2013-09-18 MED ORDER — HEPARIN (PORCINE) IN NACL 2-0.9 UNIT/ML-% IJ SOLN
INTRAMUSCULAR | Status: AC
Start: 2013-09-18 — End: 2013-09-18
  Filled 2013-09-18: qty 1500

## 2013-09-18 MED ORDER — VERAPAMIL HCL 2.5 MG/ML IV SOLN
INTRAVENOUS | Status: AC
  Filled 2013-09-18: qty 2

## 2013-09-18 MED ORDER — NITROGLYCERIN 0.2 MG/ML ON CALL CATH LAB
INTRAVENOUS | Status: AC
  Filled 2013-09-18: qty 1

## 2013-09-18 MED ORDER — FENTANYL CITRATE 0.05 MG/ML IJ SOLN
INTRAMUSCULAR | Status: AC
  Filled 2013-09-18: qty 2

## 2013-09-18 MED ORDER — ASPIRIN 81 MG PO CHEW
81.0000 mg | CHEWABLE_TABLET | ORAL | Status: AC
  Administered 2013-09-18: 81 mg via ORAL

## 2013-09-18 MED ORDER — SODIUM CHLORIDE 0.9 % IJ SOLN: 3.0000 mL | Freq: Two times a day (BID) | INTRAMUSCULAR | Status: DC

## 2013-09-18 MED ORDER — SODIUM CHLORIDE 0.9 % IV SOLN: 1.0000 mL/kg/h | INTRAVENOUS | Status: DC

## 2013-09-18 MED ORDER — SODIUM CHLORIDE 0.9 % IV SOLN: 250.0000 mL | INTRAVENOUS | Status: DC | PRN

## 2013-09-18 MED ORDER — MIDAZOLAM HCL 2 MG/2ML IJ SOLN
INTRAMUSCULAR | Status: AC
  Filled 2013-09-18: qty 2

## 2013-09-18 MED ORDER — ONDANSETRON HCL 4 MG/2ML IJ SOLN: 4.0000 mg | Freq: Four times a day (QID) | INTRAMUSCULAR | Status: DC | PRN

## 2013-09-18 MED ORDER — LIDOCAINE HCL (PF) 1 % IJ SOLN
INTRAMUSCULAR | Status: AC
  Filled 2013-09-18: qty 30

## 2013-09-18 MED ORDER — ASPIRIN 81 MG PO CHEW
CHEWABLE_TABLET | ORAL | Status: AC
  Filled 2013-09-18: qty 1

## 2013-09-18 MED ORDER — SODIUM CHLORIDE 0.9 % IV SOLN
INTRAVENOUS | Status: DC
  Administered 2013-09-18: 12:00:00 via INTRAVENOUS

## 2013-09-18 NOTE — Interval H&P Note (Signed)
History and Physical Interval Note:  09/18/2013 2:47 PM  Lawrence Underwood  has presented today for surgery, with the diagnosis of mitro reg.  The various methods of treatment have been discussed with the patient and family. After consideration of risks, benefits and other options for treatment, the patient has consented to  Procedure(s): LEFT AND RIGHT HEART CATHETERIZATION WITH CORONARY ANGIOGRAM (N/A) as a surgical intervention .  The patient's history has been reviewed, patient examined, no change in status, stable for surgery.  I have reviewed the patient's chart and labs.  Questions were answered to the patient's satisfaction.     Tonny Bollman

## 2013-09-18 NOTE — H&P (View-Only) (Signed)
HPI:   48 year old gentleman presenting for initial evaluation of mitral regurgitation. He was seen by Dr. Ermalene SearingBedsole last week for rectal bleeding. During her exam, she noticed a prominent heart murmur. A heart murmur had never been appreciated in the past. He was referred for an echocardiogram which demonstrated a possible flail segment of the posterior leaflet of the mitral valve with severe mitral regurgitation. Left ventricular function was normal.  The patient has no history of cardiac disease. He's never been told of a heart murmur in the past. He is physically active and has no exertional symptoms. He walks 3-4 miles several days per week. He walks at a pace of about a 14 minute mile. He specifically denies chest pain, shortness of breath, edema, or palpitations, orthopnea, or PND.  He has a history of high cholesterol. He's working on diet and exercise program and trying to manage this with lifestyle changes. He plans on having his cholesterol rechecked in a few months.  The patient is married with 3 children, ages 5018, 1414, and 5810. [place]. 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  Outpatient Encounter Prescriptions as of 08/21/2013  Medication Sig  . [DISCONTINUED] hydrocortisone (ANUSOL-HC) 25 MG suppository Place 1 suppository (25 mg total) rectally daily.    Review of patient's allergies indicates no known allergies.  Past Medical History  Diagnosis Date  . Gout     Past Surgical History  Procedure Laterality Date  . Hand surgery      R hand  . Knee surgery      L knee arthroscopy    History   Social History  . Marital Status: Married    Spouse Name: N/A    Number of Children: N/A  . Years of Education: N/A   Occupational History  . [employer]    Social History Main Topics  . Smoking status: Never Smoker   . Smokeless tobacco: Never Used  . Alcohol Use: Yes     Comment: occasional  . Drug Use: No  . Sexual Activity: Not on file    Other Topics Concern  . Not on file   Social History Narrative  . No narrative on file    Family history: His father had a myocardial infarction at age 46. A paternal uncle died of a myocardial infarction in his 250s.  ROS:  General: no fevers/chills/night sweats Eyes: no blurry vision, diplopia, or amaurosis ENT: no sore throat or hearing loss Resp: no cough, wheezing, or hemoptysis CV: no edema or palpitations GI: no abdominal pain, nausea, vomiting, diarrhea, or constipation GU: no dysuria, frequency, or hematuria Skin: no rash Neuro: no headache, numbness, tingling, or weakness of extremities Musculoskeletal: no joint pain or swelling Heme: no bleeding, DVT, or easy bruising Endo: no polydipsia or polyuria  BP 130/70  Pulse 71  Ht 5\' 10"  (1.778 m)  Wt 90.266 kg (199 lb)  BMI 28.55 kg/m2  PHYSICAL EXAM: Pt is alert and oriented, WD, WN, in no distress. HEENT: normal Neck: JVP normal. Carotid upstrokes normal without bruits. No thyromegaly. Lungs: equal expansion, clear bilaterally CV: Apex is discrete and nondisplaced, RRR with a grade 3-4/6 holosystolic murmur best heard at the LV apex Abd: soft, NT, +BS, no bruit, no hepatosplenomegaly Back: no CVA tenderness Ext: no C/C/E        DP/PT pulses intact and = Skin: warm and dry without rash Neuro: CNII-XII intact  Strength intact = bilaterally  EKG:  Normal sinus rhythm 71 beats per minute, within normal limits.  2D Echo: Study Conclusions  - Left ventricle: The cavity size was mildly dilated. Wall thickness was normal. Systolic function was normal. The estimated ejection fraction was in the range of 55% to 60%. - Mitral valve: Possible flail segment of the poserior leaflet with anteriorly directed MR Suggest TEE for further evaluation with 3D imaging There was severe regurgitation. - Left atrium: The atrium was moderately dilated. - Atrial septum: No defect or patent foramen ovale was  identified. - Pulmonary arteries: PA peak pressure: 31 mm Hg (S).  ASSESSMENT AND PLAN: 1. Severe mitral regurgitation, asymptomatic. I have recommended a transesophageal echo to better evaluate the mechanism of the patient's mitral regurgitation which appears to be Primary MR from a flail posterior leaflet segment. We discussed treatment considerations at length, including medical therapy and observation versus early surgical intervention with valve repair. Will obtain an exercise treadmill study to evaluate his exercise capacity and assess any potential functional limitation related to his mitral regurgitation. I will see him back in followup after his TEE and treadmill test are done.  2. Hypercholesterolemia. He has lipids followed through a program at his work. Total cholesterol was in the 260s. I suspect he'll require a statin drug and I would be in favor of this considering his strong family history of CAD. He will have followup lipids and LFTs in a few months after a period of diet and exercise.  Tonny Bollman 08/21/2013 2:50 PM

## 2013-09-18 NOTE — Discharge Instructions (Signed)
Radial Site Care °Refer to this sheet in the next few weeks. These instructions provide you with information on caring for yourself after your procedure. Your caregiver may also give you more specific instructions. Your treatment has been planned according to current medical practices, but problems sometimes occur. Call your caregiver if you have any problems or questions after your procedure. °HOME CARE INSTRUCTIONS °· You may shower the day after the procedure. Remove the bandage (dressing) and gently wash the site with plain soap and water. Gently pat the site dry. °· Do not apply powder or lotion to the site. °· Do not submerge the affected site in water for 3 to 5 days. °· Inspect the site at least twice daily. °· Do not flex or bend the affected arm for 24 hours. °· No lifting over 5 pounds (2.3 kg) for 5 days after your procedure. °· Do not drive home if you are discharged the same day of the procedure. Have someone else drive you. °· You may drive 24 hours after the procedure unless otherwise instructed by your caregiver. °· Do not operate machinery or power tools for 24 hours. °· A responsible adult should be with you for the first 24 hours after you arrive home. °What to expect: °· Any bruising will usually fade within 1 to 2 weeks. °· Blood that collects in the tissue (hematoma) may be painful to the touch. It should usually decrease in size and tenderness within 1 to 2 weeks. °SEEK IMMEDIATE MEDICAL CARE IF: °· You have unusual pain at the radial site. °· You have redness, warmth, swelling, or pain at the radial site. °· You have drainage (other than a small amount of blood on the dressing). °· You have chills. °· You have a fever or persistent symptoms for more than 72 hours. °· You have a fever and your symptoms suddenly get worse. °· Your arm becomes pale, cool, tingly, or numb. °· You have heavy bleeding from the site. Hold pressure on the site. °Document Released: 04/09/2010 Document Revised:  05/30/2011 Document Reviewed: 04/09/2010 °ExitCare® Patient Information ©2015 [employer]. This information is not intended to replace advice given to you by your health care provider. Make sure you discuss any questions you have with your health care provider. ° °

## 2013-09-18 NOTE — Progress Notes (Signed)
TR Band received with 12ccs. 3cc removed at 16:52. No bleeding at site. No numbness or tingling. Brisk cap refill, sat wave form positive.

## 2013-09-18 NOTE — CV Procedure (Signed)
    Cardiac Catheterization Procedure Note  Name: Lawrence Underwood MRN: [id number] DOB: [birth year]  Procedure: Right Heart Cath, Left Heart Cath, Selective Coronary Angiography, LV angiography  Indication: Severe MR (preop study)   Procedural Details: The right wrist was prepped, draped, and anesthetized with 1% lidocaine. Using the modified Seldinger technique a 5/6 French sheath was placed in the right radial artery and a 5 French sheath was placed in the right antecubital vein. The venous sheath was changed out for an indwelling peripheral IV using normal technique. A Swan-Ganz catheter was used for the right heart catheterization. Standard protocol was followed for recording of right heart pressures and sampling of oxygen saturations. Fick cardiac output was calculated. Standard Judkins catheters were used for selective coronary angiography and left ventriculography. There were no immediate procedural complications. The patient was transferred to the post catheterization recovery area for further monitoring.  Procedural Findings: Hemodynamics RA 6 RV 32/10 PA 29/12 mean 20 PCWP a wave 16, v wave 17 mean 13 LV 113/10 AO 115/67 mean 89  Oxygen saturations: PA 79 SVC 70 AO 97  Cardiac Output (Fick) 4.5  Cardiac Index (Fick) 2.2   Coronary angiography: Coronary dominance: right  Left mainstem: The left main arises from the left coronary cusp. The vessel is widely patent and it divides into the LAD, intermediate branch, and left circumflex  Left anterior descending (LAD): The LAD is a large caliber vessel. The vessel is widely patent throughout its distribution. There is a very large first diagonal has a high origin off of the proximal LAD. This vessel has no significant angiographic disease. The second diagonal is medium in caliber without significant stenosis. The LAD reaches and wraps around the left ventricular apex.  Left circumflex (LCx): The left circumflex is patent. There is  no obstructive disease present. The vessel gives off a first obtuse marginal branch. There is a small intermediate branch. The posterolateral branches are patent. The AV circumflex is patent without significant stenosis.  Right coronary artery (RCA): This is a dominant vessel. The mid vessel at the origin of the first RV marginal branch has approximately 40% stenosis. Intracoronary nitroglycerin was administered and there was no significant change in the appearance of the vessel. The other portions of the right coronary artery appeared angiographically normal. The acute marginal branch is normal. The PDA is patent.  Left ventriculography: LV function appears vigorous. The LV ejection fraction is estimated at 65%. There is severe mitral regurgitation present. The visualized portion of the ascending aorta appears normal.  Final Conclusions:   1. Nonobstructive coronary artery disease, primarily affecting the mid right coronary artery with a mild to moderate lesion in segment. Otherwise the patient has angiographically normal coronary arteries.  2. Normal LV systolic function with severe mitral regurgitation  3. Normal resting intracardiac hemodynamics   Tonny Bollman 09/18/2013, 4:02 PM

## 2013-09-18 NOTE — Progress Notes (Signed)
Received patient r radial TR band in place, pt right neurovascualr status intact, r upper extremity elevated above heart . Pt offers no complaints noted. V/ss. Transferred to  Short stay - Shawna Orleans Rn to received report at the bedside

## 2013-09-23 ENCOUNTER — Encounter: Payer: Self-pay | Admitting: Thoracic Surgery (Cardiothoracic Vascular Surgery)

## 2013-09-23 ENCOUNTER — Other Ambulatory Visit: Payer: Self-pay | Admitting: *Deleted

## 2013-09-23 ENCOUNTER — Ambulatory Visit (INDEPENDENT_AMBULATORY_CARE_PROVIDER_SITE_OTHER): Payer: BC Managed Care – PPO | Admitting: Thoracic Surgery (Cardiothoracic Vascular Surgery)

## 2013-09-23 ENCOUNTER — Telehealth: Payer: Self-pay

## 2013-09-23 VITALS — BP 163/82 | HR 74 | Resp 20 | Ht 70.0 in | Wt 194.0 lb

## 2013-09-23 DIAGNOSIS — I340 Nonrheumatic mitral (valve) insufficiency: Secondary | ICD-10-CM

## 2013-09-23 DIAGNOSIS — I7409 Other arterial embolism and thrombosis of abdominal aorta: Secondary | ICD-10-CM

## 2013-09-23 DIAGNOSIS — R011 Cardiac murmur, unspecified: Secondary | ICD-10-CM

## 2013-09-23 DIAGNOSIS — I059 Rheumatic mitral valve disease, unspecified: Secondary | ICD-10-CM

## 2013-09-23 MED ORDER — AMIODARONE HCL 200 MG PO TABS: 200.0000 mg | ORAL_TABLET | Freq: Two times a day (BID) | ORAL | Status: DC

## 2013-09-23 NOTE — Progress Notes (Signed)
[address]       [place] [address]             [phone]     CARDIOTHORACIC SURGERY OFFICE NOTE  Referring Provider is Tonny Bollman, MD PCP is [name], MD   HPI:  Patient returns for followup of mitral valve prolapse with severe mitral regurgitation. He was originally seen in consultation on 09/04/2013. Since then he underwent cardiac catheterization by Dr. Excell Seltzer revealing 40% stenosis of the right coronary artery and otherwise nonobstructive coronary artery disease. He returns to the office today to discuss matters further with his wife present. He continues to report no physical problems or complaints.  No current outpatient prescriptions on file.   No current facility-administered medications for this visit.      Physical Exam:   BP 163/82  Pulse 74  Resp 20  Ht 5\' 10"  (1.778 m)  Wt 194 lb (87.998 kg)  BMI 27.84 kg/m2  SpO2 98%  General:  Well-appearing  Chest:   Clear to auscultation  CV:   Regular rate and rhythm with prominent systolic murmur  Incisions:  n/a  Abdomen:  Soft and nontender  Extremities:  Warm and well-perfused  Diagnostic Tests:  Cardiac Catheterization Procedure Note   Name: Lawrence Underwood  MRN: [id number]  DOB: 03-30-1965  Procedure: Right Heart Cath, Left Heart Cath, Selective Coronary Angiography, LV angiography  Indication: Severe MR (preop study)  Procedural Details: The right wrist was prepped, draped, and anesthetized with 1% lidocaine. Using the modified Seldinger technique a 5/6 French sheath was placed in the right radial artery and a 5 French sheath was placed in the right antecubital vein. The venous sheath was changed out for an indwelling peripheral IV using normal technique. A Swan-Ganz catheter was used for the right heart catheterization. Standard protocol was followed for recording of right heart pressures and sampling of oxygen saturations. Fick cardiac output was calculated. Standard Judkins  catheters were used for selective coronary angiography and left ventriculography. There were no immediate procedural complications. The patient was transferred to the post catheterization recovery area for further monitoring.  Procedural Findings:  Hemodynamics  RA 6  RV 32/10  PA 29/12 mean 20  PCWP a wave 16, v wave 17 mean 13  LV 113/10  AO 115/67 mean 89  Oxygen saturations:  PA 79  SVC 70  AO 97  Cardiac Output (Fick) 4.5  Cardiac Index (Fick) 2.2  Coronary angiography:  Coronary dominance: right  Left mainstem: The left main arises from the left coronary cusp. The vessel is widely patent and it divides into the LAD, intermediate branch, and left circumflex  Left anterior descending (LAD): The LAD is a large caliber vessel. The vessel is widely patent throughout its distribution. There is a very large first diagonal has a high origin off of the proximal LAD. This vessel has no significant angiographic disease. The second diagonal is medium in caliber without significant stenosis. The LAD reaches and wraps around the left ventricular apex.  Left circumflex (LCx): The left circumflex is patent. There is no obstructive disease present. The vessel gives off a first obtuse marginal branch. There is a small intermediate branch. The posterolateral branches are patent. The AV circumflex is patent without significant stenosis.  Right coronary artery (RCA): This is a dominant vessel. The mid vessel at the origin of the first RV marginal branch has approximately 40% stenosis. Intracoronary nitroglycerin was administered and there was no significant change  in the appearance of the vessel. The other portions of the right coronary artery appeared angiographically normal. The acute marginal branch is normal. The PDA is patent.  Left ventriculography: LV function appears vigorous. The LV ejection fraction is estimated at 65%. There is severe mitral regurgitation present. The visualized portion of the  ascending aorta appears normal.  Final Conclusions:  1. Nonobstructive coronary artery disease, primarily affecting the mid right coronary artery with a mild to moderate lesion in segment. Otherwise the patient has angiographically normal coronary arteries.  2. Normal LV systolic function with severe mitral regurgitation  3. Normal resting intracardiac hemodynamics  Tonny BollmanMichael Cooper  09/18/2013, 4:02 PM       Impression:  The patient has mitral valve prolapse with a large flail segment of the posterior leaflet and stage C chronic primary mitral regurgitation. He remains asymptomatic. Left ventricular size is mildly enlarged and ejection fraction is slightly reduced at 50%. He is otherwise healthy although he does have hyperlipidemia and a strong family history of coronary artery disease. Cardiac catheterization is notable for the absence of any significant flow limiting disease with perhaps 40% stenosis in the right coronary artery otherwise mild nonobstructive plaque.  Based upon review the patient's transesophageal echocardiogram I feel there is greater than 95% likelihood of successful and durable mitral valve repair, and risks associated with surgery would be expected to be very low. He appears to be an excellent candidate for minimally invasive approach for surgery.   Plan:  I have again reviewed the indications, risks, and potential benefits of mitral valve repair with the patient and his wife.   The rationale for elective surgery has been explained, including a comparison between surgery and continued medical therapy with close follow-up.  The likelihood of successful and durable valve repair has been discussed with particular reference to the findings of their recent echocardiogram.  Based upon these findings and previous experience, I have quoted them a greater than 95 percent likelihood of successful valve repair. The patient understands and accepts all potential risks of surgery including  but not limited to risk of death, stroke or other neurologic complication, myocardial infarction, congestive heart failure, respiratory failure, renal failure, bleeding requiring transfusion and/or reexploration, arrhythmia, infection or other wound complications, pneumonia, pleural and/or pericardial effusion, pulmonary embolus, aortic dissection or other major vascular complication, or delayed complications related to valve repair or replacement including but not limited to structural valve deterioration and failure, thrombosis, embolization, endocarditis, or paravalvular leak.  Alternative surgical approaches have been discussed including a comparison between conventional sternotomy and minimally-invasive techniques.  The relative risks and benefits of each have been reviewed as they pertain to the patient's specific circumstances, and all of their questions have been addressed.  Specific risks potentially related to the minimally-invasive approach were discussed at length, including but not limited to risk of conversion to full or partial sternotomy, aortic dissection or other major vascular complication, unilateral acute lung injury or pulmonary edema, phrenic nerve dysfunction or paralysis, rib fracture, chronic pain, lung hernia, or lymphocele.  All of their questions have been answered.  We plan to proceed with surgery on Friday, 10/11/2013. The patient has been given a prescription for amiodarone to begin one week prior to surgery to decrease his risk of perioperative atrial arrhythmias.  The patient will undergo routine dental examination and cleaning by his regular dentist prior to surgery.  We will obtain CT angiogram of the abdomen and pelvis to evaluate peripheral sites for cannulation for surgery.  I spent in excess of 30 minutes during the conduct of this office consultation and >50% of this time involved direct face-to-face encounter with the patient for counseling and/or coordination of  their care.   Salvatore Decent. Cornelius Moras, MD 09/23/2013 3:01 PM

## 2013-09-23 NOTE — Patient Instructions (Signed)
Begin amiodarone 7 days prior to surgery.

## 2013-09-23 NOTE — Telephone Encounter (Signed)
Pt request to speak with Dr Ermalene Searing about heart murmur; pt has seen cardiologist and pt has surgery scheduled to repair mitral valve on 10/11/13. Pt wants Dr Ermalene Searing to be aware of what is going on with pt and to thank her for finding problem. Pt request to speak with Dr Ermalene Searing.Please advise. Pt is aware Dr Ermalene Searing is not in office today.

## 2013-09-24 ENCOUNTER — Encounter (HOSPITAL_COMMUNITY): Payer: BC Managed Care – PPO

## 2013-09-24 NOTE — Telephone Encounter (Signed)
Spoke with pt about his care. He is thankful to have found this early on and is ready to move on with surgery to repair valve issue. He has follow up with me already scheduled 5 weeks after the surgery.

## 2013-09-27 ENCOUNTER — Ambulatory Visit: Payer: BC Managed Care – PPO | Admitting: Cardiovascular Disease

## 2013-09-27 ENCOUNTER — Ambulatory Visit
Admission: RE | Admit: 2013-09-27 | Discharge: 2013-09-27 | Disposition: A | Discharge status: Home / Self Care | Payer: BC Managed Care – PPO | Source: Ambulatory Visit | Attending: Thoracic Surgery (Cardiothoracic Vascular Surgery) | Admitting: Thoracic Surgery (Cardiothoracic Vascular Surgery)

## 2013-09-27 DIAGNOSIS — I7409 Other arterial embolism and thrombosis of abdominal aorta: Secondary | ICD-10-CM

## 2013-09-27 DIAGNOSIS — I059 Rheumatic mitral valve disease, unspecified: Secondary | ICD-10-CM

## 2013-09-27 MED ORDER — IOHEXOL 350 MG/ML SOLN
80.0000 mL | Freq: Once | INTRAVENOUS | Status: AC | PRN
  Administered 2013-09-27: 80 mL via INTRAVENOUS

## 2013-10-03 ENCOUNTER — Encounter (HOSPITAL_COMMUNITY): Payer: Self-pay | Admitting: Pharmacy Technician

## 2013-10-03 ENCOUNTER — Other Ambulatory Visit: Payer: Self-pay

## 2013-10-03 MED ORDER — ATORVASTATIN CALCIUM 20 MG PO TABS: 20.0000 mg | ORAL_TABLET | Freq: Every day | ORAL | Status: DC

## 2013-10-07 ENCOUNTER — Ambulatory Visit (INDEPENDENT_AMBULATORY_CARE_PROVIDER_SITE_OTHER): Payer: BC Managed Care – PPO | Admitting: Thoracic Surgery (Cardiothoracic Vascular Surgery)

## 2013-10-07 ENCOUNTER — Ambulatory Visit: Payer: BC Managed Care – PPO | Admitting: Thoracic Surgery (Cardiothoracic Vascular Surgery)

## 2013-10-07 ENCOUNTER — Encounter: Payer: Self-pay | Admitting: Thoracic Surgery (Cardiothoracic Vascular Surgery)

## 2013-10-07 VITALS — BP 148/81 | HR 70 | Resp 16 | Ht 70.0 in | Wt 194.0 lb

## 2013-10-07 DIAGNOSIS — I059 Rheumatic mitral valve disease, unspecified: Secondary | ICD-10-CM

## 2013-10-07 DIAGNOSIS — I340 Nonrheumatic mitral (valve) insufficiency: Secondary | ICD-10-CM
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
## 2013-10-09 ENCOUNTER — Encounter (HOSPITAL_COMMUNITY)
Admission: RE | Admit: 2013-10-09 | Discharge: 2013-10-09 | Disposition: A | Discharge status: Home / Self Care | Payer: BC Managed Care – PPO | Source: Ambulatory Visit | Attending: Thoracic Surgery (Cardiothoracic Vascular Surgery) | Admitting: Thoracic Surgery (Cardiothoracic Vascular Surgery)

## 2013-10-09 ENCOUNTER — Ambulatory Visit (HOSPITAL_COMMUNITY)
Admission: RE | Admit: 2013-10-09 | Discharge: 2013-10-09 | Disposition: A | Discharge status: Home / Self Care | Payer: BC Managed Care – PPO | Source: Ambulatory Visit | Attending: Thoracic Surgery (Cardiothoracic Vascular Surgery) | Admitting: Thoracic Surgery (Cardiothoracic Vascular Surgery)

## 2013-10-09 ENCOUNTER — Encounter (HOSPITAL_COMMUNITY): Payer: Self-pay

## 2013-10-09 ENCOUNTER — Other Ambulatory Visit (HOSPITAL_COMMUNITY): Payer: BC Managed Care – PPO

## 2013-10-09 VITALS — BP 144/81 | HR 63 | Temp 98.0°F | Resp 18 | Ht 70.0 in | Wt 195.3 lb

## 2013-10-09 DIAGNOSIS — I059 Rheumatic mitral valve disease, unspecified: Secondary | ICD-10-CM

## 2013-10-09 DIAGNOSIS — Z01810 Encounter for preprocedural cardiovascular examination: Secondary | ICD-10-CM

## 2013-10-09 HISTORY — DX: Hyperlipidemia, unspecified: E78.5

## 2013-10-09 HISTORY — DX: Personal history of other diseases of the respiratory system: Z87.09

## 2013-10-09 HISTORY — DX: Dizziness and giddiness: R42

## 2013-10-09 HISTORY — DX: Personal history of other diseases of the digestive system: Z87.19

## 2013-10-09 HISTORY — DX: Gastro-esophageal reflux disease without esophagitis: K21.9

## 2013-10-09 LAB — COMPREHENSIVE METABOLIC PANEL
ALK PHOS: 64 U/L (ref 39–117)
ALT: 18 U/L (ref 0–53)
ANION GAP: 16 — AB (ref 5–15)
AST: 23 U/L (ref 0–37)
Albumin: 4.3 g/dL (ref 3.5–5.2)
BILIRUBIN TOTAL: 0.8 mg/dL (ref 0.3–1.2)
BUN: 10 mg/dL (ref 6–23)
CHLORIDE: 100 meq/L (ref 96–112)
CO2: 19 mEq/L (ref 19–32)
Calcium: 9 mg/dL (ref 8.4–10.5)
Creatinine, Ser: 0.89 mg/dL (ref 0.50–1.35)
GFR calc Af Amer: >90 mL/min (ref >90)
GFR calc non Af Amer: >90 mL/min (ref >90)
Glucose, Bld: 100 mg/dL — ABNORMAL HIGH (ref 70–99)
POTASSIUM: 3.8 meq/L (ref 3.7–5.3)
SODIUM: 135 meq/L — AB (ref 137–147)
Total Protein: 7.6 g/dL (ref 6.0–8.3)

## 2013-10-09 LAB — PULMONARY FUNCTION TEST
DL/VA % PRED: 109 %
DL/VA: 5.03 ml/min/mmHg/L
DLCO COR: 32.2 ml/min/mmHg
DLCO cor % pred: 103 %
DLCO unc % pred: 103 %
DLCO unc: 32.2 ml/min/mmHg
FEF 25-75 PRE: 1.85 L/s
FEF 25-75 Post: 1.9 L/sec
FEF2575-%CHANGE-POST: 2 %
FEF2575-%PRED-POST: 52 %
FEF2575-%PRED-PRE: 51 %
FEV1-%Change-Post: 3 %
FEV1-%PRED-PRE: 73 %
FEV1-%Pred-Post: 75 %
FEV1-PRE: 2.88 L
FEV1-Post: 2.98 L
FEV1FVC-%Change-Post: 4 %
FEV1FVC-%PRED-PRE: 85 %
FEV6-%CHANGE-POST: -2 %
FEV6-%Pred-Post: 85 %
FEV6-%Pred-Pre: 86 %
FEV6-POST: 4.12 L
FEV6-Pre: 4.2 L
FEV6FVC-%Change-Post: 0 %
FEV6FVC-%Pred-Post: 101 %
FEV6FVC-%Pred-Pre: 101 %
FVC-%CHANGE-POST: -1 %
FVC-%PRED-POST: 83 %
FVC-%Pred-Pre: 84 %
FVC-POST: 4.19 L
FVC-Pre: 4.25 L
POST FEV1/FVC RATIO: 71 %
PRE FEV1/FVC RATIO: 68 %
Post FEV6/FVC ratio: 98 %
Pre FEV6/FVC Ratio: 99 %
RV % pred: 87 %
RV: 1.66 L
TLC % pred: 90 %
TLC: 6.09 L

## 2013-10-09 LAB — URINALYSIS, ROUTINE W REFLEX MICROSCOPIC
Bilirubin Urine: NEGATIVE
Glucose, UA: NEGATIVE mg/dL
Hgb urine dipstick: NEGATIVE
KETONES UR: NEGATIVE mg/dL
Leukocytes, UA: NEGATIVE
NITRITE: NEGATIVE
PH: 6 (ref 5.0–8.0)
Protein, ur: NEGATIVE mg/dL
Specific Gravity, Urine: 1.014 (ref 1.005–1.030)
Urobilinogen, UA: 0.2 mg/dL (ref 0.0–1.0)

## 2013-10-09 LAB — SURGICAL PCR SCREEN
MRSA, PCR: NEGATIVE
Staphylococcus aureus: NEGATIVE

## 2013-10-09 LAB — BLOOD GAS, ARTERIAL
Acid-base deficit: 0.1 mmol/L (ref 0.0–2.0)
BICARBONATE: 23.2 meq/L (ref 20.0–24.0)
Drawn by: 344381
O2 Saturation: 99.1 %
PCO2 ART: 32.6 mmHg — AB (ref 35.0–45.0)
PH ART: 7.465 — AB (ref 7.350–7.450)
Patient temperature: 98.6
TCO2: 24.2 mmol/L (ref 0–100)
pO2, Arterial: 120 mmHg — ABNORMAL HIGH (ref 80.0–100.0)

## 2013-10-09 LAB — CBC
HEMATOCRIT: 44 % (ref 39.0–52.0)
HEMOGLOBIN: 15.4 g/dL (ref 13.0–17.0)
MCH: 28.4 pg (ref 26.0–34.0)
MCHC: 35 g/dL (ref 30.0–36.0)
MCV: 81 fL (ref 78.0–100.0)
Platelets: 271 10*3/uL (ref 150–400)
RBC: 5.43 MIL/uL (ref 4.22–5.81)
RDW: 12.4 % (ref 11.5–15.5)
WBC: 9.7 10*3/uL (ref 4.0–10.5)

## 2013-10-09 LAB — PROTIME-INR
INR: 0.96 (ref 0.00–1.49)
PROTHROMBIN TIME: 12.8 s (ref 11.6–15.2)

## 2013-10-09 LAB — APTT: aPTT: 28 seconds (ref 24–37)

## 2013-10-09 LAB — HEMOGLOBIN A1C
Hgb A1c MFr Bld: 6 % — ABNORMAL HIGH (ref <5.7)
Mean Plasma Glucose: 126 mg/dL — ABNORMAL HIGH (ref <117)

## 2013-10-09 LAB — TYPE AND SCREEN
ABO/RH(D): O POS
Antibody Screen: NEGATIVE

## 2013-10-09 LAB — ABO/RH: ABO/RH(D): O POS

## 2013-10-09 MED ORDER — ALBUTEROL SULFATE (2.5 MG/3ML) 0.083% IN NEBU
2.5000 mg | INHALATION_SOLUTION | Freq: Once | RESPIRATORY_TRACT | Status: AC
Start: 2013-10-09 — End: 2013-10-09
  Administered 2013-10-09: 2.5 mg via RESPIRATORY_TRACT

## 2013-10-09 MED ORDER — CHLORHEXIDINE GLUCONATE 4 % EX LIQD: 30.0000 mL | CUTANEOUS | Status: DC

## 2013-10-09 NOTE — Progress Notes (Signed)
VASCULAR LAB PRELIMINARY  PRELIMINARY  PRELIMINARY  PRELIMINARY  Pre-op Cardiac Surgery  Carotid Findings:  Bilateral:  1-39% ICA stenosis lowest end of range.  Vertebral artery flow is antegrade.     Upper Extremity Right Left  Brachial Pressures 149 Triphasic 144 Triphasic  Radial Waveforms Triphasic Triphasic  Ulnar Waveforms Triphasic Triphasic  Palmar Arch (Allen's Test) Normal Norma   Findings:  Doppler waveforms remained normal bilaterally with both radial and ulnar compressions   Haylyn Halberg, RVS 10/09/2013, 1:55 PM

## 2013-10-09 NOTE — Pre-Procedure Instructions (Signed)
Per Notaro  10/09/2013   Your procedure is scheduled on:  Fri, July 24 @ 7:30 AM  Report to [facility]  at 5:30 AM.  Call this number if you have problems the morning of surgery: [phone]   Remember:   Do not eat food or drink liquids after midnight.   Take these medicines the morning of surgery with A SIP OF WATER: Amiodarone(Pacerone)               No Goody's,BC's,Aleve,Aspirin,Ibuprofen,Fish Oil,or any Herbal Medications   Do not wear jewelry  Do not wear lotions, powders, or colognes.   Do not shave 48 hours prior to surgery. Men may shave face and neck.  Do not bring valuables to the hospital.  [facility] is not responsible                  for any belongings or valuables.               Contacts, dentures or bridgework may not be worn into surgery.  Leave suitcase in the car. After surgery it may be brought to your room.  For patients admitted to the hospital, discharge time is determined by your                treatment team.                 Special Instructions:  [facility] - Preparing for Surgery  Before surgery, you can play an important role.  Because skin is not sterile, your skin needs to be as free of germs as possible.  You can reduce the number of germs on you skin by washing with CHG (chlorahexidine gluconate) soap before surgery.  CHG is an antiseptic cleaner which kills germs and bonds with the skin to continue killing germs even after washing.  Please DO NOT use if you have an allergy to CHG or antibacterial soaps.  If your skin becomes reddened/irritated stop using the CHG and inform your nurse when you arrive at Short Stay.  Do not shave (including legs and underarms) for at least 48 hours prior to the first CHG shower.  You may shave your face.  Please follow these instructions carefully:   1.  Shower with CHG Soap the night before surgery and the                                morning of Surgery.  2.  If you choose to wash your hair, wash  your hair first as usual with your       normal shampoo.  3.  After you shampoo, rinse your hair and body thoroughly to remove the                      Shampoo.  4.  Use CHG as you would any other liquid soap.  You can apply chg directly       to the skin and wash gently with scrungie or a clean washcloth.  5.  Apply the CHG Soap to your body ONLY FROM THE NECK DOWN.        Do not use on open wounds or open sores.  Avoid contact with your eyes,       ears, mouth and genitals (private parts).  Wash genitals (private parts)       with your normal soap.  6.  Wash  thoroughly, paying special attention to the area where your surgery        will be performed.  7.  Thoroughly rinse your body with warm water from the neck down.  8.  DO NOT shower/wash with your normal soap after using and rinsing off       the CHG Soap.  9.  Pat yourself dry with a clean towel.            10.  Wear clean pajamas.            11.  Place clean sheets on your bed the night of your first shower and do not        sleep with pets.  Day of Surgery  Do not apply any lotions/deoderants the morning of surgery.  Please wear clean clothes to the hospital/surgery center.     Please read over the following fact sheets that you were given: Pain Booklet, Coughing and Deep Breathing, Blood Transfusion Information, MRSA Information and Surgical Site Infection Prevention

## 2013-10-09 NOTE — Progress Notes (Addendum)
Dr.Cooper is cardiologist with last visit on 09-18-13 when heart cath was done  Heart cath done 09-18-13  Denies ever having a stress test  Echo in epic from 2015  Medical Md is Dr.Amy Bedsole  Confirmed PFT and Dopplers done today  Denies CXR in past 2 wks  Denies EKG in past month

## 2013-10-10 ENCOUNTER — Other Ambulatory Visit: Payer: Self-pay

## 2013-10-10 ENCOUNTER — Encounter: Payer: Self-pay | Admitting: Thoracic Surgery (Cardiothoracic Vascular Surgery)

## 2013-10-10 ENCOUNTER — Ambulatory Visit (INDEPENDENT_AMBULATORY_CARE_PROVIDER_SITE_OTHER): Payer: BC Managed Care – PPO | Admitting: Thoracic Surgery (Cardiothoracic Vascular Surgery)

## 2013-10-10 ENCOUNTER — Encounter (HOSPITAL_COMMUNITY): Payer: Self-pay | Admitting: Certified Registered Nurse Anesthetist

## 2013-10-10 VITALS — BP 142/82 | HR 77 | Resp 20 | Ht 70.0 in | Wt 194.0 lb

## 2013-10-10 DIAGNOSIS — R011 Cardiac murmur, unspecified: Secondary | ICD-10-CM

## 2013-10-10 DIAGNOSIS — L559 Sunburn, unspecified: Secondary | ICD-10-CM

## 2013-10-10 DIAGNOSIS — I340 Nonrheumatic mitral (valve) insufficiency: Secondary | ICD-10-CM

## 2013-10-10 DIAGNOSIS — I059 Rheumatic mitral valve disease, unspecified: Secondary | ICD-10-CM

## 2013-10-10 MED ORDER — SODIUM CHLORIDE 0.9 % IV SOLN
INTRAVENOUS | Status: AC
  Administered 2013-10-11: 2.1 [IU]/h via INTRAVENOUS
  Filled 2013-10-10: qty 1

## 2013-10-10 MED ORDER — AMINOCAPROIC ACID 250 MG/ML IV SOLN
INTRAVENOUS | Status: AC
  Administered 2013-10-11: 69.8 mL/h via INTRAVENOUS
  Filled 2013-10-10: qty 40

## 2013-10-10 MED ORDER — DEXMEDETOMIDINE HCL IN NACL 400 MCG/100ML IV SOLN
0.1000 ug/kg/h | INTRAVENOUS | Status: DC
  Filled 2013-10-10: qty 100

## 2013-10-10 MED ORDER — METOPROLOL TARTRATE 12.5 MG HALF TABLET
12.5000 mg | ORAL_TABLET | Freq: Once | ORAL | Status: AC
  Administered 2013-10-11: 12.5 mg via ORAL
  Filled 2013-10-10: qty 1

## 2013-10-10 MED ORDER — VANCOMYCIN HCL 10 G IV SOLR
1500.0000 mg | INTRAVENOUS | Status: AC
  Administered 2013-10-11: 1500 mg via INTRAVENOUS
  Filled 2013-10-10: qty 1500

## 2013-10-10 MED ORDER — CEFUROXIME SODIUM 750 MG IJ SOLR
750.0000 mg | INTRAMUSCULAR | Status: DC
  Filled 2013-10-10: qty 750

## 2013-10-10 MED ORDER — POTASSIUM CHLORIDE 2 MEQ/ML IV SOLN
80.0000 meq | INTRAVENOUS | Status: DC
  Filled 2013-10-10: qty 40

## 2013-10-10 MED ORDER — DOPAMINE-DEXTROSE 3.2-5 MG/ML-% IV SOLN
2.0000 ug/kg/min | INTRAVENOUS | Status: DC
  Filled 2013-10-10: qty 250

## 2013-10-10 MED ORDER — HEPARIN SODIUM (PORCINE) 1000 UNIT/ML IJ SOLN
INTRAMUSCULAR | Status: DC
  Filled 2013-10-10: qty 30

## 2013-10-10 MED ORDER — MAGNESIUM SULFATE 50 % IJ SOLN
40.0000 meq | INTRAMUSCULAR | Status: DC
Start: 2013-10-11 — End: 2013-10-11
  Filled 2013-10-10: qty 10

## 2013-10-10 MED ORDER — PHENYLEPHRINE HCL 10 MG/ML IJ SOLN
30.0000 ug/min | INTRAVENOUS | Status: DC
  Filled 2013-10-10: qty 2

## 2013-10-10 MED ORDER — NITROGLYCERIN IN D5W 200-5 MCG/ML-% IV SOLN
2.0000 ug/min | INTRAVENOUS | Status: DC
  Filled 2013-10-10: qty 250

## 2013-10-10 MED ORDER — VANCOMYCIN HCL 1000 MG IV SOLR
INTRAVENOUS | Status: AC
  Administered 2013-10-11: 09:00:00
  Filled 2013-10-10: qty 1000

## 2013-10-10 MED ORDER — PLASMA-LYTE 148 IV SOLN
INTRAVENOUS | Status: DC
  Filled 2013-10-10: qty 2.5

## 2013-10-10 MED ORDER — DEXTROSE 5 % IV SOLN
1.5000 g | INTRAVENOUS | Status: AC
  Administered 2013-10-11: 1.5 g via INTRAVENOUS
  Filled 2013-10-10 (×2): qty 1.5

## 2013-10-10 MED ORDER — EPINEPHRINE HCL 1 MG/ML IJ SOLN
0.5000 ug/min | INTRAVENOUS | Status: DC
  Filled 2013-10-10: qty 4

## 2013-10-10 NOTE — H&P (Signed)
[address]       [place] [address]             [phone]          CARDIOTHORACIC SURGERY HISTORY AND PHYSICAL EXAM  Referring Provider is Tonny Bollman, MD PCP is [name], MD    Chief Complaint   Patient presents with   .  Heart Murmur       severe MR per ECHO/TEE.Marland Kitcheneval for MVReplacement     HPI:  Patient is a 48 year old otherwise healthy white male who was recently discovered to have a heart murmur on physical exam by his primary care physician. An echocardiogram was performed demonstrating the presence of mitral valve prolapse with mitral regurgitation. The patient was seen in consultation by Dr. Excell Seltzer and subsequently referred for transesophageal echocardiogram. This confirmed the presence of mitral valve prolapse with a large flail segment of the posterior leaflet and severe mitral regurgitation. The patient was referred for surgical consultation. He was originally seen in consultation on 09/04/2013. Since then he underwent cardiac catheterization by Dr. Excell Seltzer revealing 40% stenosis of the right coronary artery and otherwise nonobstructive coronary artery disease.   The patient is an otherwise healthy married father of 3 teenage children who remains active physically. He works in Armed forces operational officer for [employer].  He exercises on a regular basis including walking with occasional light jogging. He denies any development of symptoms of exertional shortness of breath or any significant change in his exercise tolerance. He reports occasional transient atypical symptoms of chest pain that are not related to physical exertion. He occasionally gets palpitations when he feels stress. He denies any history of PND, orthopnea, or lower extremity edema. He has an occasional non-productive cough, but this is not persistent or chronic.   Past Medical History  Diagnosis Date  . Gout     but doesn't take any meds for this  . Mitral regurgitation due to cusp prolapse  08/26/2013  . Coronary artery disease 09/18/2013    40% stenosis RCA and otherwise non-obstructive CAD   . History of bronchitis 2 yrs ago  . Dizziness     r/t mitral valve  . Hyperlipidemia   . H/O hiatal hernia   . GERD (gastroesophageal reflux disease)     no meds    Past Surgical History  Procedure Laterality Date  . Hand surgery      R hand  . Knee surgery Left     arthroscopy  . Tee without cardioversion N/A 08/26/2013    Procedure: TRANSESOPHAGEAL ECHOCARDIOGRAM (TEE);  Surgeon: [name], MD;  Location: [facility] ENDOSCOPY;  Service: Cardiovascular;  Laterality: N/A;  . Wisdom tooth extraction    . Cardiac catheterization  09/18/13    No family history on file.  Social History History  Substance Use Topics  . Smoking status: Never Smoker   . Smokeless tobacco: Never Used  . Alcohol Use: Yes     Comment: weekends    Prior to Admission medications   Medication Sig Start Date End Date Taking? Authorizing Provider  amiodarone (PACERONE) 200 MG tablet Take 1 tablet (200 mg total) by mouth 2 (two) times daily. Begin 7 days prior to surgery. 09/23/13  Yes Purcell Nails, MD    No Known Allergies   Review of Systems:              General:  normal appetite, normal energy, no weight gain, no weight loss, no fever             Cardiac:                      no chest pain with exertion, occasional transient atypical chest pain at rest, no SOB with exertion, no resting SOB, no PND, no orthopnea, occasional palpitations, no arrhythmia, no atrial fibrillation, no LE edema, no dizzy spells, no syncope             Respiratory:                no shortness of breath, no home oxygen, no productive cough, occasional dry cough, no bronchitis, no wheezing, no hemoptysis, no asthma, no pain with inspiration or cough, no sleep apnea, no CPAP at night             GI:                                no difficulty swallowing, no reflux, no frequent heartburn, no hiatal hernia, no  abdominal pain, no constipation, no diarrhea, no hematochezia, no hematemesis, no melena             GU:                              no dysuria,  no frequency, no urinary tract infection, no hematuria, no enlarged prostate, no kidney stones, no kidney disease             Vascular:                     no pain suggestive of claudication, no pain in feet, no leg cramps, no varicose veins, no DVT, no non-healing foot ulcer             Neuro:                         no stroke, no TIA's, no seizures, no headaches, no temporary blindness one eye,  no slurred speech, no peripheral neuropathy, no chronic pain, no instability of gait, no memory/cognitive dysfunction             Musculoskeletal:         no arthritis, no joint swelling, no myalgias, no difficulty walking, normal mobility               Skin:                            no rash, no itching, no skin infections, no pressure sores or ulcerations             Psych:                         no anxiety, no depression, no nervousness, no unusual recent stress             Eyes:                           no blurry vision, no floaters, no recent vision changes, no wears glasses or contacts             ENT:  no hearing loss, no loose or painful teeth, no dentures, last saw dentist several years ago             Hematologic:               no easy bruising, no abnormal bleeding, no clotting disorder, no frequent epistaxis             Endocrine:                   no diabetes, does not check CBG's at home                           Physical Exam:              BP 135/79  Pulse 62  Resp 16  Ht 5\' 10"  (1.778 m)  Wt 198 lb (89.812 kg)  BMI 28.41 kg/m2  SpO2 98%             General:                      Healthy,  well-appearing             HEENT:                       Unremarkable               Neck:                           no JVD, no bruits, no adenopathy               Chest:                         clear to auscultation,  symmetrical breath sounds, no wheezes, no rhonchi               CV:                              RRR, grade IV/VI holosystolic murmur               Abdomen:                    soft, non-tender, no masses               Extremities:                 warm, well-perfused, pulses palpable, no LE edema             Rectal/GU                   Deferred             Neuro:                         Grossly non-focal and symmetrical throughout             Skin:                            Clean and dry, no rashes, no breakdown   Diagnostic Tests:    Transthoracic Echocardiography  Patient: [place], [place] MR #: [id number] Study Date: 08/19/2013 Gender: M Age:  47 Height: 177.8 cm Weight: 89.4 kg BSA: 2.12 m^2 Pt. Status: Room:  ATTENDING Ermalene Searing, Amy E Milagros Reap, Amy E REFERRING Ermalene Searing Amy E SONOGRAPHER [employer] PERFORMING [facility] Outpatient  cc:  ------------------------------------------------------------------- LV EF: 55% - 60%  ------------------------------------------------------------------- Indications: Murmur 785.2.  ------------------------------------------------------------------- History: PMH: Acquired from the patient and from the patient&'s chart. Murmur. Risk factors: Dyslipidemia.  ------------------------------------------------------------------- Study Conclusions  - Left ventricle: The cavity size was mildly dilated. Wall thickness was normal. Systolic function was normal. The estimated ejection fraction was in the range of 55% to 60%. - Mitral valve: Possible flail segment of the poserior leaflet with anteriorly directed MR Suggest TEE for further evaluation with 3D imaging There was severe regurgitation. - Left atrium: The atrium was moderately dilated. - Atrial septum: No defect or patent foramen ovale was identified. - Pulmonary arteries: PA peak pressure: 31 mm Hg  (S).  -------------------------------------------------------------------  ------------------------------------------------------------------- Left ventricle: The cavity size was mildly dilated. Wall thickness was normal. Systolic function was normal. The estimated ejection fraction was in the range of 55% to 60%.  ------------------------------------------------------------------- Aortic valve: Structurally normal valve. Cusp separation was normal. Doppler: Transvalvular velocity was within the normal range. There was no stenosis. There was no regurgitation.  ------------------------------------------------------------------- Mitral valve: Possible flail segment of the poserior leaflet with anteriorly directed MR Suggest TEE for further evaluation with 3D imaging Doppler: There was severe regurgitation. Peak gradient (D): 9 mm Hg.  ------------------------------------------------------------------- Left atrium: The atrium was moderately dilated.  ------------------------------------------------------------------- Atrial septum: No defect or patent foramen ovale was identified.  ------------------------------------------------------------------- Right ventricle: The cavity size was normal. Wall thickness was normal. Systolic function was normal.  ------------------------------------------------------------------- Pulmonic valve: Structurally normal valve. Cusp separation was normal. Doppler: Transvalvular velocity was within the normal range. There was mild regurgitation.  ------------------------------------------------------------------- Tricuspid valve: Structurally normal valve. Leaflet separation was normal. Doppler: Transvalvular velocity was within the normal range. There was mild regurgitation.  ------------------------------------------------------------------- Right atrium: The atrium was normal in  size.  ------------------------------------------------------------------- Pericardium: The pericardium was normal in appearance.  ------------------------------------------------------------------- Prepared and Electronically Authenticated by  Charlton Haws, M.D. 2015-06-01T09:16:08  ------------------------------------------------------------------- Measurements  Left ventricle Value Reference LV ID, ED, PLAX chordal (H) 60 mm 43 - 52 LV ID, ES, PLAX chordal (N) 38 mm 23 - 38 LV fx shortening, PLAX chordal (N) 37 % >=29 LV PW thickness, ED 13 mm --------- IVS/LV PW ratio, ED (N) 0.77 <=1.3 Stroke volume, 2D 68 ml --------- Stroke volume/bsa, 2D 32 ml/m^2 --------- LV e&', lateral 13.3 cm/s --------- LV E/e&', lateral 11.05 --------- LV e&', medial 7.14 cm/s --------- LV E/e&', medial 20.59 --------- LV e&', average 10.22 cm/s --------- LV E/e&', average 14.38 ---------  Ventricular septum Value Reference IVS thickness, ED 10 mm ---------  LVOT Value Reference LVOT ID, S 21 mm --------- LVOT area 3.46 cm^2 --------- LVOT ID 21 mm --------- LVOT VTI, S 19.4 cm --------- Stroke volume (SV), LVOT DP 67.2 ml --------- Stroke index (SV/bsa), LVOT DP 31.7 ml/m^2 ---------  Aorta Value Reference Aortic root ID, ED 34 mm ---------  Left atrium Value Reference LA ID, A-P, ES 44 mm --------- LA ID/bsa, A-P (N) 2.08 cm/m^2 <=2.2  Mitral valve Value Reference Mitral E-wave peak velocity 147 cm/s --------- Mitral A-wave peak velocity 66.5 cm/s --------- Mitral deceleration time (N) 190 ms 150 - 230 Mitral peak gradient, D 9 mm Hg --------- Mitral E/A ratio, peak 2.2 --------- Mitral maximal regurg velocity, 419 cm/s --------- PISA Mitral regurg VTI, PISA 123 cm ---------  Pulmonary arteries Value Reference PA pressure, S, DP (H) 31 mm Hg <=30  Tricuspid valve Value Reference Tricuspid regurg peak velocity 263 cm/s --------- Tricuspid peak RV-RA gradient 28 mm Hg  --------- Tricuspid maximal regurg velocity, 263 cm/s --------- PISA  Systemic veins Value Reference Estimated CVP 3 mm Hg ---------  Right ventricle Value Reference RV pressure, S, DP (H) 31 mm Hg <=30 RV s&', lateral, S 18 cm/s ---------  Legend: (L) and (H) mark values outside specified reference range.  (N) marks values inside specified reference range.            Transesophageal Echocardiography  Patient: [place], [place] MR #: [id number] Study Date: 08/26/2013 Gender: M Age: 48 Height: 177.8 cm Weight: 90 kg BSA: 2.13 m^2 Pt. Status: Room:  SONOGRAPHER Dewitt Hoes, RDCS SONOGRAPHER Arvil Chaco ADMITTING Tobias Alexander, M.D. ATTENDING Tobias Alexander, M.D. ORDERING Tobias Alexander, M.D. PERFORMING Tobias Alexander, M.D.  cc:  ------------------------------------------------------------------- LV EF: 50% - 55%  ------------------------------------------------------------------- Indications: Mitral regurgitation 424.0.  ------------------------------------------------------------------- Study Conclusions  - Left ventricle: The cavity size was mildly dilated. Systolic function was low normal. The estimated ejection fraction was 50 %. Wall motion was normal; there were no regional wall motion abnormalities. - Aortic valve: Structurally normal valve. Trileaflet; normal thickness leaflets. There was no significant regurgitation. - Aortic root: The aortic root was not dilated. - Ascending aorta: The ascending aorta was normal in size. - Mitral valve: Mitral valve appears myxomatous. There is a severe prolapse of the P2 scallop of the posterior mitral valve leaflet with associated severe mitral regurgitation. The regurgitation jet is directed anteriorly. There is mild systolic flow reversal in the right pulmonary veins. - Left atrium: The atrium was moderately dilated. No evidence of thrombus in the atrial cavity or appendage. No evidence of thrombus in  the atrial cavity or appendage. No evidence of thrombus in the appendage. - Right ventricle: Systolic function was normal. - Right atrium: No evidence of thrombus in the atrial cavity or appendage. - Tricuspid valve: There was mild regurgitation.  Impressions:  - Severe P2 prolapse, severe mitral regurgitation. Low normal LVEF 50%.  Diagnostic transesophageal echocardiography. 2D and color Doppler. Birthdate: Patient birthdate: [birth year]. Age: Patient is 48 yr old. Sex: Gender: male. Height: Height: 177.8 cm. Height: 70 in. Weight: Weight: 90 kg. Weight: 198 lb. Body mass index: BMI: 28.5 kg/m^2. Body surface area: BSA: 2.13 m^2. Blood pressure: 150/90 Patient status: Outpatient. Study date: Study date: 08/26/2013. Study time: 09:53 AM. Location: Endoscopy.  -------------------------------------------------------------------  ------------------------------------------------------------------- Left ventricle: The cavity size was mildly dilated. Systolic function was low normal. The estimated ejection fraction was 50 %. Wall motion was normal; there were no regional wall motion abnormalities.  ------------------------------------------------------------------- Aortic valve: Structurally normal valve. Trileaflet; normal thickness leaflets. Cusp separation was normal. Doppler: There was no significant regurgitation.  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 The aorta was normal, not dilated, and non-diseased. There was no atheroma. There was no evidence for dissection. Aortic root: The aortic root was not dilated. Ascending aorta: The ascending aorta was normal in size. Aortic arch: The aortic arch was normal in size. Descending aorta: The descending aorta was normal in size.  ------------------------------------------------------------------- Mitral  valve: There is a severe prolapse of the P2 scallop of the posterior mitral valve leaflet with associated severe mitral regurgitation. The regurgitation jet is directed anteriorly. There is mild systolic flow reversal in the right pulmonary veins.  ------------------------------------------------------------------- Left atrium: The atrium was  moderately dilated. No evidence of thrombus in the atrial cavity or appendage. No evidence of thrombus in the atrial cavity or appendage. No evidence of thrombus in the appendage. The appendage was morphologically a left appendage, multilobulated, and of normal size. Emptying velocity was normal.  ------------------------------------------------------------------- Right ventricle: The cavity size was normal. Wall thickness was normal. Systolic function was normal.  ------------------------------------------------------------------- Pulmonic valve: Structurally normal valve.  ------------------------------------------------------------------- Tricuspid valve: Structurally normal valve. Leaflet separation was normal. Doppler: There was mild regurgitation.  ------------------------------------------------------------------- Pulmonary artery: The main pulmonary artery was normal-sized.  ------------------------------------------------------------------- Right atrium: The atrium was normal in size. No evidence of thrombus in the atrial cavity or appendage. The appendage was morphologically a right appendage.  ------------------------------------------------------------------- Pericardium: There was no pericardial effusion.  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 The aorta was normal, not dilated,  and non-diseased.  ------------------------------------------------------------------- Prepared and Electronically Authenticated by  Tobias Alexander, M.D. 2015-06-08T12:06:27    Cardiac Catheterization Procedure Note   Name: Demorrio Thornhill   MRN: [id number]   DOB: 06-27-1965   Procedure: Right Heart Cath, Left Heart Cath, Selective Coronary Angiography, LV angiography   Indication: Severe MR (preop study)   Procedural Details: The right wrist was prepped, draped, and anesthetized with 1% lidocaine. Using the modified Seldinger technique a 5/6 French sheath was placed in the right radial artery and a 5 French sheath was placed in the right antecubital vein. The venous sheath was changed out for an indwelling peripheral IV using normal technique. A Swan-Ganz catheter was used for the right heart catheterization. Standard protocol was followed for recording of right heart pressures and sampling of oxygen saturations. Fick cardiac output was calculated. Standard Judkins catheters were used for selective coronary angiography and left ventriculography. There were no immediate procedural complications. The patient was transferred to the post catheterization recovery area for further monitoring.   Procedural Findings:   Hemodynamics   RA 6   RV 32/10   PA 29/12 mean 20   PCWP a wave 16, v wave 17 mean 13   LV 113/10   AO 115/67 mean 89   Oxygen saturations:   PA 79   SVC 70   AO 97   Cardiac Output (Fick) 4.5   Cardiac Index (Fick) 2.2   Coronary angiography:   Coronary dominance: right   Left mainstem: The left main arises from the left coronary cusp. The vessel is widely patent and it divides into the LAD, intermediate branch, and left circumflex   Left anterior descending (LAD): The LAD is a large caliber vessel. The vessel is widely patent throughout its distribution. There is a very large first diagonal has a high origin off of the proximal LAD. This vessel has no significant  angiographic disease. The second diagonal is medium in caliber without significant stenosis. The LAD reaches and wraps around the left ventricular apex.   Left circumflex (LCx): The left circumflex is patent. There is no obstructive disease present. The vessel gives off a first obtuse marginal branch. There is a small intermediate branch. The posterolateral branches are patent. The AV circumflex is patent without significant stenosis.   Right coronary artery (RCA): This is a dominant vessel. The mid vessel at the origin of the first RV marginal branch has approximately 40% stenosis. Intracoronary nitroglycerin was administered and there was no significant change in the appearance of the vessel. The other portions of the right coronary artery appeared  angiographically normal. The acute marginal branch is normal. The PDA is patent.   Left ventriculography: LV function appears vigorous. The LV ejection fraction is estimated at 65%. There is severe mitral regurgitation present. The visualized portion of the ascending aorta appears normal.   Final Conclusions:   1. Nonobstructive coronary artery disease, primarily affecting the mid right coronary artery with a mild to moderate lesion in segment. Otherwise the patient has angiographically normal coronary arteries.   2. Normal LV systolic function with severe mitral regurgitation   3. Normal resting intracardiac hemodynamics   Tonny Bollman   09/18/2013, 4:02 PM     CT ANGIOGRAPHY ABDOMEN AND PELVIS WITH CONTRAST AND WITHOUT CONTRAST  TECHNIQUE:  Multidetector CT imaging of the abdomen and pelvis was performed  using the standard protocol during bolus administration of  intravenous contrast. Multiplanar reconstructed images and MIPs were  obtained and reviewed to evaluate the vascular anatomy.  CONTRAST: 80mL OMNIPAQUE IOHEXOL 350 MG/ML SOLN  COMPARISON: None.  FINDINGS:  The aorta is non aneurysmal and patent.  Celiac axis is widely patent. Branch  vessels are patent.  SMA is widely patent. Branch vessels are patent. Replaced right  hepatic artery anatomy.  IMA is patent. Branch vessels are patent.  Right renal artery is patent. Left renal artery is patent.  Bilateral common, internal, and external iliac arteries are all  widely patent. There is mild tortuosity of the iliac arteries.  Common femoral arteries are widely patent. Upper superficial femoral  arteries are widely patent.  There is mild enlargement of the left atrium. No significant mitral  valve calcification.  Multiple sub cm liver hypodensities. The largest is 7 mm in the  right lobe on image 36 of series 4.  Spleen, pancreas, adrenal glands, and kidneys are unremarkable.  Umbilical hernia contains only adipose tissue and is small.  Normal appendix, bladder, and prostate.  Advanced degenerative disc disease at L5-S1 with severe disc space  narrowing and vacuum. Bilateral lateral recess narrowing right  greater than left. This could result in right S1 nerve roots  symptomatology.  Review of the MIP images confirms the above findings.  IMPRESSION:  No significant arterial occlusive disease in the abdomen or pelvis.  Specifically, aorta and iliac arteries are widely patent.  L5-S1 degenerative disc disease.  Subcentimeter hypodensites in the liver which are likely benign. If  there is a history of malignancy or high risk for liver malignancy,  follow-up MRI at 6 months is recommended  Electronically Signed  By: Maryclare Bean M.D.  On: 09/27/2013 09:26    Impression:  The patient has mitral valve prolapse with a large flail segment of the posterior leaflet and stage C chronic primary mitral regurgitation. He remains asymptomatic. Left ventricular size is mildly enlarged and ejection fraction is slightly reduced at 50%. He is otherwise healthy although he does have hyperlipidemia and a strong family history of coronary artery disease. Cardiac catheterization is notable for  the absence of any significant flow limiting disease with perhaps 40% stenosis in the right coronary artery otherwise mild nonobstructive plaque.  Based upon review the patient's transesophageal echocardiogram I feel there is greater than 95% likelihood of successful and durable mitral valve repair, and risks associated with surgery would be expected to be very low. He appears to be an excellent candidate for minimally invasive approach for surgery.     Plan:  I have reviewed the indications, risks, and potential benefits of mitral valve repair with the patient and his wife.  The rationale for elective surgery has been explained, including a comparison between surgery and continued medical therapy with close follow-up.  The likelihood of successful and durable valve repair has been discussed with particular reference to the findings of their recent echocardiogram.  Based upon these findings and previous experience, I have quoted them a greater than 95 percent likelihood of successful valve repair. The patient understands and accepts all potential risks of surgery including but not limited to risk of death, stroke or other neurologic complication, myocardial infarction, congestive heart failure, respiratory failure, renal failure, bleeding requiring transfusion and/or reexploration, arrhythmia, infection or other wound complications, pneumonia, pleural and/or pericardial effusion, pulmonary embolus, aortic dissection or other major vascular complication, or delayed complications related to valve repair or replacement including but not limited to structural valve deterioration and failure, thrombosis, embolization, endocarditis, or paravalvular leak.  Alternative surgical approaches have been discussed including a comparison between conventional sternotomy and minimally-invasive techniques.  The relative risks and benefits of each have been reviewed as they pertain to the patient's specific circumstances, and  all of their questions have been addressed.  Specific risks potentially related to the minimally-invasive approach were discussed at length, including but not limited to risk of conversion to full or partial sternotomy, aortic dissection or other major vascular complication, unilateral acute lung injury or pulmonary edema, phrenic nerve dysfunction or paralysis, rib fracture, chronic pain, lung hernia, or lymphocele.  All of their questions have been answered.  We plan to proceed with surgery on Friday, 10/11/2013. The patient has been given a prescription for amiodarone to begin one week prior to surgery to decrease his risk of perioperative atrial arrhythmias.      Salvatore Decent. Cornelius Moras, MD
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
## 2013-10-11 ENCOUNTER — Inpatient Hospital Stay (HOSPITAL_COMMUNITY)
Admission: RE | Admit: 2013-10-11 | Discharge: 2013-10-15 | DRG: 220 | Disposition: A | Discharge status: Home / Self Care | Payer: BC Managed Care – PPO | Source: Ambulatory Visit | Attending: Thoracic Surgery (Cardiothoracic Vascular Surgery) | Admitting: Thoracic Surgery (Cardiothoracic Vascular Surgery)

## 2013-10-11 ENCOUNTER — Encounter (HOSPITAL_COMMUNITY): Payer: Self-pay | Admitting: *Deleted

## 2013-10-11 ENCOUNTER — Encounter (HOSPITAL_COMMUNITY): Payer: BC Managed Care – PPO | Admitting: Certified Registered Nurse Anesthetist

## 2013-10-11 ENCOUNTER — Encounter (HOSPITAL_COMMUNITY)
Admission: RE | Disposition: A | Discharge status: Home / Self Care | Payer: BC Managed Care – PPO | Source: Ambulatory Visit | Attending: Thoracic Surgery (Cardiothoracic Vascular Surgery)

## 2013-10-11 ENCOUNTER — Inpatient Hospital Stay (HOSPITAL_COMMUNITY): Payer: BC Managed Care – PPO | Admitting: Certified Registered Nurse Anesthetist

## 2013-10-11 ENCOUNTER — Inpatient Hospital Stay (HOSPITAL_COMMUNITY): Payer: BC Managed Care – PPO

## 2013-10-11 DIAGNOSIS — E785 Hyperlipidemia, unspecified: Secondary | ICD-10-CM | POA: Diagnosis present

## 2013-10-11 DIAGNOSIS — Z9889 Other specified postprocedural states: Secondary | ICD-10-CM

## 2013-10-11 DIAGNOSIS — D62 Acute posthemorrhagic anemia: Secondary | ICD-10-CM | POA: Diagnosis not present

## 2013-10-11 DIAGNOSIS — I341 Nonrheumatic mitral (valve) prolapse: Secondary | ICD-10-CM | POA: Diagnosis present

## 2013-10-11 DIAGNOSIS — I498 Other specified cardiac arrhythmias: Secondary | ICD-10-CM | POA: Diagnosis present

## 2013-10-11 DIAGNOSIS — J9819 Other pulmonary collapse: Secondary | ICD-10-CM | POA: Diagnosis not present

## 2013-10-11 DIAGNOSIS — K219 Gastro-esophageal reflux disease without esophagitis: Secondary | ICD-10-CM | POA: Diagnosis present

## 2013-10-11 DIAGNOSIS — K449 Diaphragmatic hernia without obstruction or gangrene: Secondary | ICD-10-CM | POA: Diagnosis present

## 2013-10-11 DIAGNOSIS — I059 Rheumatic mitral valve disease, unspecified: Secondary | ICD-10-CM

## 2013-10-11 DIAGNOSIS — I340 Nonrheumatic mitral (valve) insufficiency: Secondary | ICD-10-CM | POA: Diagnosis present

## 2013-10-11 DIAGNOSIS — I2510 Atherosclerotic heart disease of native coronary artery without angina pectoris: Secondary | ICD-10-CM | POA: Diagnosis present

## 2013-10-11 HISTORY — PX: MITRAL VALVE REPAIR: SHX2039

## 2013-10-11 HISTORY — DX: Other specified postprocedural states: Z98.890

## 2013-10-11 HISTORY — PX: INTRAOPERATIVE TRANSESOPHAGEAL ECHOCARDIOGRAM: SHX5062

## 2013-10-11 LAB — POCT I-STAT, CHEM 8
BUN: 15 mg/dL (ref 6–23)
BUN: 12 mg/dL (ref 6–23)
BUN: 13 mg/dL (ref 6–23)
BUN: 13 mg/dL (ref 6–23)
BUN: 12 mg/dL (ref 6–23)
BUN: 11 mg/dL (ref 6–23)
BUN: 10 mg/dL (ref 6–23)
CALCIUM ION: 1.24 mmol/L — AB (ref 1.12–1.23)
CALCIUM ION: 1.11 mmol/L — AB (ref 1.12–1.23)
CALCIUM ION: 1.05 mmol/L — AB (ref 1.12–1.23)
CHLORIDE: 104 meq/L (ref 96–112)
CREATININE: 1 mg/dL (ref 0.50–1.35)
Calcium, Ion: 1.02 mmol/L — ABNORMAL LOW (ref 1.12–1.23)
Calcium, Ion: 1.21 mmol/L (ref 1.12–1.23)
Calcium, Ion: 1.14 mmol/L (ref 1.12–1.23)
Calcium, Ion: 1.11 mmol/L — ABNORMAL LOW (ref 1.12–1.23)
Chloride: 101 mEq/L (ref 96–112)
Chloride: 106 mEq/L (ref 96–112)
Chloride: 95 mEq/L — ABNORMAL LOW (ref 96–112)
Chloride: 104 mEq/L (ref 96–112)
Chloride: 106 mEq/L (ref 96–112)
Chloride: 109 mEq/L (ref 96–112)
Creatinine, Ser: 1 mg/dL (ref 0.50–1.35)
Creatinine, Ser: 0.8 mg/dL (ref 0.50–1.35)
Creatinine, Ser: 1 mg/dL (ref 0.50–1.35)
Creatinine, Ser: 0.9 mg/dL (ref 0.50–1.35)
Creatinine, Ser: 0.9 mg/dL (ref 0.50–1.35)
Creatinine, Ser: 0.8 mg/dL (ref 0.50–1.35)
GLUCOSE: 129 mg/dL — AB (ref 70–99)
GLUCOSE: 147 mg/dL — AB (ref 70–99)
GLUCOSE: 124 mg/dL — AB (ref 70–99)
GLUCOSE: 139 mg/dL — AB (ref 70–99)
Glucose, Bld: 103 mg/dL — ABNORMAL HIGH (ref 70–99)
Glucose, Bld: 129 mg/dL — ABNORMAL HIGH (ref 70–99)
Glucose, Bld: 153 mg/dL — ABNORMAL HIGH (ref 70–99)
HCT: 43 % (ref 39.0–52.0)
HCT: 33 % — ABNORMAL LOW (ref 39.0–52.0)
HCT: 41 % (ref 39.0–52.0)
HCT: 37 % — ABNORMAL LOW (ref 39.0–52.0)
HCT: 34 % — ABNORMAL LOW (ref 39.0–52.0)
HEMATOCRIT: 30 % — AB (ref 39.0–52.0)
HEMATOCRIT: 36 % — AB (ref 39.0–52.0)
HEMOGLOBIN: 11.6 g/dL — AB (ref 13.0–17.0)
HEMOGLOBIN: 12.2 g/dL — AB (ref 13.0–17.0)
Hemoglobin: 14.6 g/dL (ref 13.0–17.0)
Hemoglobin: 11.2 g/dL — ABNORMAL LOW (ref 13.0–17.0)
Hemoglobin: 13.9 g/dL (ref 13.0–17.0)
Hemoglobin: 12.6 g/dL — ABNORMAL LOW (ref 13.0–17.0)
Hemoglobin: 10.2 g/dL — ABNORMAL LOW (ref 13.0–17.0)
Potassium: 4.4 mEq/L (ref 3.7–5.3)
Potassium: 4.5 mEq/L (ref 3.7–5.3)
Potassium: 4.6 mEq/L (ref 3.7–5.3)
Potassium: 5.2 mEq/L (ref 3.7–5.3)
Potassium: 5 mEq/L (ref 3.7–5.3)
Potassium: 3.7 mEq/L (ref 3.7–5.3)
Potassium: 4 mEq/L (ref 3.7–5.3)
SODIUM: 134 meq/L — AB (ref 137–147)
SODIUM: 138 meq/L (ref 137–147)
Sodium: 142 mEq/L (ref 137–147)
Sodium: 139 mEq/L (ref 137–147)
Sodium: 136 mEq/L — ABNORMAL LOW (ref 137–147)
Sodium: 139 mEq/L (ref 137–147)
Sodium: 142 mEq/L (ref 137–147)
TCO2: 26 mmol/L (ref 0–100)
TCO2: 22 mmol/L (ref 0–100)
TCO2: 25 mmol/L (ref 0–100)
TCO2: 23 mmol/L (ref 0–100)
TCO2: 23 mmol/L (ref 0–100)
TCO2: 23 mmol/L (ref 0–100)
TCO2: 20 mmol/L (ref 0–100)

## 2013-10-11 LAB — POCT I-STAT 3, ART BLOOD GAS (G3+)
ACID-BASE DEFICIT: 3 mmol/L — AB (ref 0.0–2.0)
Acid-base deficit: 3 mmol/L — ABNORMAL HIGH (ref 0.0–2.0)
Acid-base deficit: 1 mmol/L (ref 0.0–2.0)
Acid-base deficit: 2 mmol/L (ref 0.0–2.0)
Bicarbonate: 23.6 mEq/L (ref 20.0–24.0)
Bicarbonate: 24.3 mEq/L — ABNORMAL HIGH (ref 20.0–24.0)
Bicarbonate: 24 mEq/L (ref 20.0–24.0)
Bicarbonate: 23.1 mEq/L (ref 20.0–24.0)
O2 SAT: 94 %
O2 SAT: 93 %
O2 Saturation: 100 %
O2 Saturation: 98 %
PCO2 ART: 39.7 mmHg (ref 35.0–45.0)
PCO2 ART: 43 mmHg (ref 35.0–45.0)
PH ART: 7.322 — AB (ref 7.350–7.450)
PO2 ART: 101 mmHg — AB (ref 80.0–100.0)
PO2 ART: 70 mmHg — AB (ref 80.0–100.0)
Patient temperature: 35.8
Patient temperature: 36.1
Patient temperature: 36.4
TCO2: 25 mmol/L (ref 0–100)
TCO2: 26 mmol/L (ref 0–100)
TCO2: 25 mmol/L (ref 0–100)
TCO2: 24 mmol/L (ref 0–100)
pCO2 arterial: 45.7 mmHg — ABNORMAL HIGH (ref 35.0–45.0)
pCO2 arterial: 41.1 mmHg (ref 35.0–45.0)
pH, Arterial: 7.389 (ref 7.350–7.450)
pH, Arterial: 7.35 (ref 7.350–7.450)
pH, Arterial: 7.355 (ref 7.350–7.450)
pO2, Arterial: 276 mmHg — ABNORMAL HIGH (ref 80.0–100.0)
pO2, Arterial: 73 mmHg — ABNORMAL LOW (ref 80.0–100.0)

## 2013-10-11 LAB — POCT I-STAT 4, (NA,K, GLUC, HGB,HCT)
Glucose, Bld: 89 mg/dL (ref 70–99)
HCT: 34 % — ABNORMAL LOW (ref 39.0–52.0)
HEMOGLOBIN: 11.6 g/dL — AB (ref 13.0–17.0)
Potassium: 3.8 mEq/L (ref 3.7–5.3)
Sodium: 141 mEq/L (ref 137–147)

## 2013-10-11 LAB — PROTIME-INR
INR: 1.29 (ref 0.00–1.49)
Prothrombin Time: 16.1 seconds — ABNORMAL HIGH (ref 11.6–15.2)

## 2013-10-11 LAB — CBC
HEMATOCRIT: 35.8 % — AB (ref 39.0–52.0)
HEMATOCRIT: 35.6 % — AB (ref 39.0–52.0)
HEMOGLOBIN: 12.3 g/dL — AB (ref 13.0–17.0)
Hemoglobin: 12.2 g/dL — ABNORMAL LOW (ref 13.0–17.0)
MCH: 28.7 pg (ref 26.0–34.0)
MCH: 27.9 pg (ref 26.0–34.0)
MCHC: 34.4 g/dL (ref 30.0–36.0)
MCHC: 34.3 g/dL (ref 30.0–36.0)
MCV: 83.4 fL (ref 78.0–100.0)
MCV: 81.3 fL (ref 78.0–100.0)
Platelets: 144 10*3/uL — ABNORMAL LOW (ref 150–400)
Platelets: 181 10*3/uL (ref 150–400)
RBC: 4.29 MIL/uL (ref 4.22–5.81)
RBC: 4.38 MIL/uL (ref 4.22–5.81)
RDW: 12.6 % (ref 11.5–15.5)
RDW: 12.3 % (ref 11.5–15.5)
WBC: 10.9 10*3/uL — ABNORMAL HIGH (ref 4.0–10.5)
WBC: 12.8 10*3/uL — AB (ref 4.0–10.5)

## 2013-10-11 LAB — CREATININE, SERUM
Creatinine, Ser: 0.89 mg/dL (ref 0.50–1.35)
GFR calc Af Amer: >90 mL/min (ref >90)

## 2013-10-11 LAB — GLUCOSE, CAPILLARY
GLUCOSE-CAPILLARY: 91 mg/dL (ref 70–99)
Glucose-Capillary: 99 mg/dL (ref 70–99)
Glucose-Capillary: 132 mg/dL — ABNORMAL HIGH (ref 70–99)
Glucose-Capillary: 136 mg/dL — ABNORMAL HIGH (ref 70–99)

## 2013-10-11 LAB — HEMOGLOBIN AND HEMATOCRIT, BLOOD
HEMATOCRIT: 35.4 % — AB (ref 39.0–52.0)
HEMOGLOBIN: 12.2 g/dL — AB (ref 13.0–17.0)

## 2013-10-11 LAB — APTT: aPTT: 32 seconds (ref 24–37)

## 2013-10-11 LAB — PLATELET COUNT: PLATELETS: 182 10*3/uL (ref 150–400)

## 2013-10-11 LAB — MAGNESIUM: Magnesium: 2.8 mg/dL — ABNORMAL HIGH (ref 1.5–2.5)

## 2013-10-11 SURGERY — REPAIR, MITRAL VALVE, MINIMALLY INVASIVE
Anesthesia: General | Site: Chest | Laterality: Right

## 2013-10-11 MED ORDER — LACTATED RINGERS IV SOLN: 500.0000 mL | Freq: Once | INTRAVENOUS | Status: AC | PRN

## 2013-10-11 MED ORDER — SODIUM CHLORIDE 0.9 % IJ SOLN
3.0000 mL | Freq: Two times a day (BID) | INTRAMUSCULAR | Status: DC
  Administered 2013-10-12 – 2013-10-14 (×4): 3 mL via INTRAVENOUS

## 2013-10-11 MED ORDER — 0.9 % SODIUM CHLORIDE (POUR BTL) OPTIME
TOPICAL | Status: DC | PRN
  Administered 2013-10-11: 6000 mL

## 2013-10-11 MED ORDER — NITROGLYCERIN IN D5W 200-5 MCG/ML-% IV SOLN: 0.0000 ug/min | INTRAVENOUS | Status: DC

## 2013-10-11 MED ORDER — OXYCODONE HCL 5 MG PO TABS
5.0000 mg | ORAL_TABLET | ORAL | Status: DC | PRN
  Administered 2013-10-14: 10 mg via ORAL
  Filled 2013-10-11: qty 2

## 2013-10-11 MED ORDER — PHENYLEPHRINE HCL 10 MG/ML IJ SOLN
INTRAMUSCULAR | Status: DC | PRN
  Administered 2013-10-11: 80 ug via INTRAVENOUS

## 2013-10-11 MED ORDER — VECURONIUM BROMIDE 10 MG IV SOLR
INTRAVENOUS | Status: DC | PRN
  Administered 2013-10-11 (×4): 5 mg via INTRAVENOUS

## 2013-10-11 MED ORDER — ROCURONIUM BROMIDE 100 MG/10ML IV SOLN
INTRAVENOUS | Status: DC | PRN
  Administered 2013-10-11: 100 mg via INTRAVENOUS

## 2013-10-11 MED ORDER — HEPARIN SODIUM (PORCINE) 1000 UNIT/ML IJ SOLN
INTRAMUSCULAR | Status: AC
  Filled 2013-10-11: qty 1

## 2013-10-11 MED ORDER — SODIUM CHLORIDE 0.9 % IV SOLN
250.0000 mL | INTRAVENOUS | Status: AC
  Administered 2013-10-11: 100 mL/h via INTRAVENOUS

## 2013-10-11 MED ORDER — FENTANYL CITRATE 0.05 MG/ML IJ SOLN
INTRAMUSCULAR | Status: AC
  Filled 2013-10-11: qty 5

## 2013-10-11 MED ORDER — FAMOTIDINE IN NACL 20-0.9 MG/50ML-% IV SOLN
20.0000 mg | Freq: Two times a day (BID) | INTRAVENOUS | Status: AC
  Administered 2013-10-11: 20 mg via INTRAVENOUS
  Filled 2013-10-11: qty 50

## 2013-10-11 MED ORDER — SODIUM CHLORIDE 0.9 % IJ SOLN: 3.0000 mL | INTRAMUSCULAR | Status: DC | PRN

## 2013-10-11 MED ORDER — MAGNESIUM SULFATE 4000MG/100ML IJ SOLN
4.0000 g | Freq: Once | INTRAMUSCULAR | Status: AC
  Administered 2013-10-11: 4 g via INTRAVENOUS
  Filled 2013-10-11: qty 100

## 2013-10-11 MED ORDER — HEPARIN SODIUM (PORCINE) 1000 UNIT/ML IJ SOLN
INTRAMUSCULAR | Status: DC | PRN
  Administered 2013-10-11: 38000 [IU] via INTRAVENOUS

## 2013-10-11 MED ORDER — STERILE WATER FOR INJECTION IJ SOLN
INTRAMUSCULAR | Status: AC
  Filled 2013-10-11: qty 20

## 2013-10-11 MED ORDER — ACETAMINOPHEN 650 MG RE SUPP
650.0000 mg | Freq: Once | RECTAL | Status: AC
  Administered 2013-10-11: 650 mg via RECTAL

## 2013-10-11 MED ORDER — LACTATED RINGERS IV SOLN
INTRAVENOUS | Status: DC | PRN
  Administered 2013-10-11: 07:00:00 via INTRAVENOUS

## 2013-10-11 MED ORDER — ALBUMIN HUMAN 5 % IV SOLN
INTRAVENOUS | Status: DC | PRN
  Administered 2013-10-11 (×3): via INTRAVENOUS

## 2013-10-11 MED ORDER — MIDAZOLAM HCL 10 MG/2ML IJ SOLN
INTRAMUSCULAR | Status: AC
  Filled 2013-10-11: qty 2

## 2013-10-11 MED ORDER — MORPHINE SULFATE 2 MG/ML IJ SOLN: 2.0000 mg | INTRAMUSCULAR | Status: DC | PRN

## 2013-10-11 MED ORDER — LACTATED RINGERS IV SOLN: INTRAVENOUS | Status: DC

## 2013-10-11 MED ORDER — MIDAZOLAM HCL 2 MG/2ML IJ SOLN
INTRAMUSCULAR | Status: AC
  Filled 2013-10-11: qty 2

## 2013-10-11 MED ORDER — MORPHINE SULFATE 2 MG/ML IJ SOLN: 1.0000 mg | INTRAMUSCULAR | Status: AC | PRN

## 2013-10-11 MED ORDER — PHENYLEPHRINE HCL 10 MG/ML IJ SOLN
20.0000 mg | INTRAVENOUS | Status: DC | PRN
  Administered 2013-10-11: 25 ug/min via INTRAVENOUS

## 2013-10-11 MED ORDER — PROTAMINE SULFATE 10 MG/ML IV SOLN
INTRAVENOUS | Status: DC | PRN
  Administered 2013-10-11: 10 mg via INTRAVENOUS
  Administered 2013-10-11: 290 mg via INTRAVENOUS

## 2013-10-11 MED ORDER — INSULIN REGULAR BOLUS VIA INFUSION
0.0000 [IU] | Freq: Three times a day (TID) | INTRAVENOUS | Status: DC
  Filled 2013-10-11: qty 10

## 2013-10-11 MED ORDER — LACTATED RINGERS IV SOLN
INTRAVENOUS | Status: DC | PRN
  Administered 2013-10-11: 06:00:00 via INTRAVENOUS

## 2013-10-11 MED ORDER — BUPIVACAINE HCL (PF) 0.5 % IJ SOLN
INTRAMUSCULAR | Status: AC
  Filled 2013-10-11: qty 10

## 2013-10-11 MED ORDER — ARTIFICIAL TEARS OP OINT
TOPICAL_OINTMENT | OPHTHALMIC | Status: DC | PRN
  Administered 2013-10-11: 1 via OPHTHALMIC

## 2013-10-11 MED ORDER — SODIUM CHLORIDE 0.9 % IV SOLN
INTRAVENOUS | Status: DC
  Administered 2013-10-11: 20 mL/h via INTRAVENOUS

## 2013-10-11 MED ORDER — ROCURONIUM BROMIDE 50 MG/5ML IV SOLN
INTRAVENOUS | Status: AC
  Filled 2013-10-11: qty 3

## 2013-10-11 MED ORDER — ACETAMINOPHEN 500 MG PO TABS
1000.0000 mg | ORAL_TABLET | Freq: Four times a day (QID) | ORAL | Status: DC
Start: 2013-10-12 — End: 2013-10-15
  Administered 2013-10-12 – 2013-10-15 (×13): 1000 mg via ORAL
  Filled 2013-10-11 (×17): qty 2

## 2013-10-11 MED ORDER — PROPOFOL 10 MG/ML IV BOLUS
INTRAVENOUS | Status: DC | PRN
  Administered 2013-10-11: 100 mg via INTRAVENOUS

## 2013-10-11 MED ORDER — ASPIRIN EC 325 MG PO TBEC
325.0000 mg | DELAYED_RELEASE_TABLET | Freq: Every day | ORAL | Status: DC
  Administered 2013-10-12: 325 mg via ORAL
  Filled 2013-10-11 (×2): qty 1

## 2013-10-11 MED ORDER — PROPOFOL 10 MG/ML IV BOLUS
INTRAVENOUS | Status: AC
  Filled 2013-10-11: qty 20

## 2013-10-11 MED ORDER — SODIUM CHLORIDE 0.9 % IV SOLN
INTRAVENOUS | Status: DC | PRN
Start: 2013-10-11 — End: 2013-10-11
  Administered 2013-10-11: 13:00:00 via INTRAVENOUS

## 2013-10-11 MED ORDER — FENTANYL CITRATE 0.05 MG/ML IJ SOLN
INTRAMUSCULAR | Status: AC
  Filled 2013-10-11: qty 2

## 2013-10-11 MED ORDER — METOPROLOL TARTRATE 25 MG/10 ML ORAL SUSPENSION
12.5000 mg | Freq: Two times a day (BID) | ORAL | Status: DC
  Filled 2013-10-11 (×3): qty 5

## 2013-10-11 MED ORDER — MIDAZOLAM HCL 5 MG/5ML IJ SOLN
INTRAMUSCULAR | Status: DC | PRN
  Administered 2013-10-11: 2 mg via INTRAVENOUS
  Administered 2013-10-11: 1 mg via INTRAVENOUS
  Administered 2013-10-11: 3 mg via INTRAVENOUS
  Administered 2013-10-11: 2 mg via INTRAVENOUS
  Administered 2013-10-11: 1 mg via INTRAVENOUS
  Administered 2013-10-11: 2 mg via INTRAVENOUS
  Administered 2013-10-11 (×2): 1 mg via INTRAVENOUS
  Administered 2013-10-11: 2 mg via INTRAVENOUS
  Administered 2013-10-11: 1 mg via INTRAVENOUS

## 2013-10-11 MED ORDER — BISACODYL 5 MG PO TBEC
10.0000 mg | DELAYED_RELEASE_TABLET | Freq: Every day | ORAL | Status: DC
  Administered 2013-10-12 – 2013-10-13 (×2): 10 mg via ORAL
  Filled 2013-10-11 (×3): qty 2

## 2013-10-11 MED ORDER — POTASSIUM CHLORIDE 10 MEQ/50ML IV SOLN
10.0000 meq | INTRAVENOUS | Status: AC
  Administered 2013-10-11 (×3): 10 meq via INTRAVENOUS

## 2013-10-11 MED ORDER — BUPIVACAINE 0.5 % ON-Q PUMP SINGLE CATH 400 ML
INJECTION | Status: DC | PRN
  Administered 2013-10-11: 400 mL

## 2013-10-11 MED ORDER — PROTAMINE SULFATE 10 MG/ML IV SOLN
INTRAVENOUS | Status: AC
  Filled 2013-10-11: qty 50

## 2013-10-11 MED ORDER — LIDOCAINE HCL (CARDIAC) 20 MG/ML IV SOLN
INTRAVENOUS | Status: DC | PRN
  Administered 2013-10-11: 100 mg via INTRAVENOUS

## 2013-10-11 MED ORDER — PANTOPRAZOLE SODIUM 40 MG PO TBEC
40.0000 mg | DELAYED_RELEASE_TABLET | Freq: Every day | ORAL | Status: DC
  Administered 2013-10-13 – 2013-10-15 (×3): 40 mg via ORAL
  Filled 2013-10-11 (×4): qty 1

## 2013-10-11 MED ORDER — DEXMEDETOMIDINE HCL IN NACL 200 MCG/50ML IV SOLN
INTRAVENOUS | Status: DC | PRN
  Administered 2013-10-11: 0.3 ug/kg/h via INTRAVENOUS

## 2013-10-11 MED ORDER — BUPIVACAINE 0.5 % ON-Q PUMP SINGLE CATH 400 ML
400.0000 mL | INJECTION | Status: DC
  Filled 2013-10-11: qty 400

## 2013-10-11 MED ORDER — DEXTROSE 5 % IV SOLN
1.5000 g | Freq: Two times a day (BID) | INTRAVENOUS | Status: AC
  Administered 2013-10-11 – 2013-10-13 (×4): 1.5 g via INTRAVENOUS
  Filled 2013-10-11 (×4): qty 1.5

## 2013-10-11 MED ORDER — METOPROLOL TARTRATE 12.5 MG HALF TABLET
12.5000 mg | ORAL_TABLET | Freq: Two times a day (BID) | ORAL | Status: DC
  Administered 2013-10-13 – 2013-10-15 (×5): 12.5 mg via ORAL
  Filled 2013-10-11 (×9): qty 1

## 2013-10-11 MED ORDER — FENTANYL CITRATE 0.05 MG/ML IJ SOLN
INTRAMUSCULAR | Status: DC | PRN
  Administered 2013-10-11: 50 ug via INTRAVENOUS
  Administered 2013-10-11: 100 ug via INTRAVENOUS
  Administered 2013-10-11: 50 ug via INTRAVENOUS
  Administered 2013-10-11 (×7): 100 ug via INTRAVENOUS
  Administered 2013-10-11 (×3): 150 ug via INTRAVENOUS
  Administered 2013-10-11: 350 ug via INTRAVENOUS
  Administered 2013-10-11: 150 ug via INTRAVENOUS

## 2013-10-11 MED ORDER — PHENYLEPHRINE HCL 10 MG/ML IJ SOLN
10.0000 mg | INTRAVENOUS | Status: DC | PRN
  Administered 2013-10-11: 15 ug/min via INTRAVENOUS

## 2013-10-11 MED ORDER — ONDANSETRON HCL 4 MG/2ML IJ SOLN: 4.0000 mg | Freq: Four times a day (QID) | INTRAMUSCULAR | Status: DC | PRN

## 2013-10-11 MED ORDER — METOPROLOL TARTRATE 1 MG/ML IV SOLN: 2.5000 mg | INTRAVENOUS | Status: DC | PRN

## 2013-10-11 MED ORDER — MIDAZOLAM HCL 2 MG/2ML IJ SOLN: 2.0000 mg | INTRAMUSCULAR | Status: DC | PRN

## 2013-10-11 MED ORDER — VECURONIUM BROMIDE 10 MG IV SOLR: INTRAVENOUS | Status: DC | PRN

## 2013-10-11 MED ORDER — SODIUM CHLORIDE 0.45 % IV SOLN
INTRAVENOUS | Status: DC
  Administered 2013-10-11: 20 mL/h via INTRAVENOUS

## 2013-10-11 MED ORDER — ASPIRIN 81 MG PO CHEW: 324.0000 mg | CHEWABLE_TABLET | Freq: Every day | ORAL | Status: DC

## 2013-10-11 MED ORDER — CHLORHEXIDINE GLUCONATE 0.12 % MT SOLN
OROMUCOSAL | Status: AC
  Administered 2013-10-11: 30 mL
  Filled 2013-10-11: qty 15

## 2013-10-11 MED ORDER — ACETAMINOPHEN 160 MG/5ML PO SOLN: 650.0000 mg | Freq: Once | ORAL | Status: AC

## 2013-10-11 MED ORDER — LIDOCAINE HCL (CARDIAC) 20 MG/ML IV SOLN
INTRAVENOUS | Status: AC
  Filled 2013-10-11: qty 5

## 2013-10-11 MED ORDER — INSULIN REGULAR HUMAN 100 UNIT/ML IJ SOLN
INTRAMUSCULAR | Status: DC
  Filled 2013-10-11: qty 1

## 2013-10-11 MED ORDER — VECURONIUM BROMIDE 10 MG IV SOLR
INTRAVENOUS | Status: AC
  Filled 2013-10-11: qty 20

## 2013-10-11 MED ORDER — ACETAMINOPHEN 160 MG/5ML PO SOLN: 1000.0000 mg | Freq: Four times a day (QID) | ORAL | Status: DC

## 2013-10-11 MED ORDER — DOCUSATE SODIUM 100 MG PO CAPS
200.0000 mg | ORAL_CAPSULE | Freq: Every day | ORAL | Status: DC
  Administered 2013-10-12 – 2013-10-15 (×3): 200 mg via ORAL
  Filled 2013-10-11 (×4): qty 2

## 2013-10-11 MED ORDER — VANCOMYCIN HCL IN DEXTROSE 1-5 GM/200ML-% IV SOLN
1000.0000 mg | Freq: Once | INTRAVENOUS | Status: AC
  Administered 2013-10-11: 1000 mg via INTRAVENOUS
  Filled 2013-10-11: qty 200

## 2013-10-11 MED ORDER — ALBUMIN HUMAN 5 % IV SOLN
250.0000 mL | INTRAVENOUS | Status: AC | PRN
  Administered 2013-10-12: 250 mL via INTRAVENOUS

## 2013-10-11 MED ORDER — DEXTROSE 5 % IV SOLN
0.0000 ug/min | INTRAVENOUS | Status: DC
  Filled 2013-10-11: qty 2

## 2013-10-11 MED ORDER — DEXMEDETOMIDINE HCL IN NACL 200 MCG/50ML IV SOLN
0.1000 ug/kg/h | INTRAVENOUS | Status: DC
  Administered 2013-10-11: 0.7 ug/kg/h via INTRAVENOUS

## 2013-10-11 MED ORDER — GLUTARALDEHYDE 0.625% SOAKING SOLUTION
TOPICAL | Status: DC | PRN
  Filled 2013-10-11: qty 50

## 2013-10-11 MED ORDER — INSULIN ASPART 100 UNIT/ML ~~LOC~~ SOLN
0.0000 [IU] | SUBCUTANEOUS | Status: DC
  Administered 2013-10-11 – 2013-10-12 (×4): 2 [IU] via SUBCUTANEOUS

## 2013-10-11 MED ORDER — BISACODYL 10 MG RE SUPP: 10.0000 mg | Freq: Every day | RECTAL | Status: DC

## 2013-10-11 SURGICAL SUPPLY — 108 items
ADAPTER CARDIO PERF ANTE/RETRO (ADAPTER) ×3 IMPLANT
ADH SKN CLS APL DERMABOND .7 (GAUZE/BANDAGES/DRESSINGS) ×4
ADPR PRFSN 84XANTGRD RTRGD (ADAPTER) ×2
APL SKNCLS STERI-STRIP NONHPOA (GAUZE/BANDAGES/DRESSINGS) ×2
BAG DECANTER FOR FLEXI CONT (MISCELLANEOUS) ×6 IMPLANT
BENZOIN TINCTURE PRP APPL 2/3 (GAUZE/BANDAGES/DRESSINGS) ×3 IMPLANT
BLADE SURG 11 STRL SS (BLADE) ×3 IMPLANT
CANISTER SUCTION 2500CC (MISCELLANEOUS) ×5 IMPLANT
CANNULA BIO-MED ART (CANNULA) ×1 IMPLANT
CANNULA FEM VENOUS REMOTE 22FR (CANNULA) IMPLANT
CANNULA FEMORAL ART 14 SM (MISCELLANEOUS) ×3 IMPLANT
CANNULA GUNDRY RCSP 15FR (MISCELLANEOUS) ×3 IMPLANT
CANNULA OPTISITE PERFUSION 16F (CANNULA) IMPLANT
CANNULA OPTISITE PERFUSION 18F (CANNULA) ×1 IMPLANT
CARDIAC SUCTION (MISCELLANEOUS) ×3 IMPLANT
CATH KIT ON Q 10IN SLV (PAIN MANAGEMENT) ×1 IMPLANT
CATH KIT ON Q 5IN SLV (PAIN MANAGEMENT) IMPLANT
CONN ST 1/4X3/8  BEN (MISCELLANEOUS) ×2
CONN ST 1/4X3/8 BEN (MISCELLANEOUS) ×4 IMPLANT
CONNECTOR 1/2X3/8X1/2 3 WAY (MISCELLANEOUS) ×1
CONNECTOR 1/2X3/8X1/2 3WAY (MISCELLANEOUS) IMPLANT
CONT SPEC STER OR (MISCELLANEOUS) ×3 IMPLANT
COVER BACK TABLE 24X17X13 BIG (DRAPES) ×3 IMPLANT
COVER SURGICAL LIGHT HANDLE (MISCELLANEOUS) ×3 IMPLANT
CRADLE DONUT ADULT HEAD (MISCELLANEOUS) ×3 IMPLANT
DERMABOND ADVANCED (GAUZE/BANDAGES/DRESSINGS) ×2
DERMABOND ADVANCED .7 DNX12 (GAUZE/BANDAGES/DRESSINGS) ×4 IMPLANT
DEVICE PMI PUNCTURE CLOSURE (MISCELLANEOUS) ×3 IMPLANT
DEVICE SUT CK QUICK LOAD MINI (Prosthesis & Implant Heart) ×4 IMPLANT
DEVICE TROCAR PUNCTURE CLOSURE (ENDOMECHANICALS) ×3 IMPLANT
DRAIN CHANNEL 28F RND 3/8 FF (WOUND CARE) ×6 IMPLANT
DRAPE BILATERAL SPLIT (DRAPES) ×3 IMPLANT
DRAPE C-ARM 42X72 X-RAY (DRAPES) ×2 IMPLANT
DRAPE CV SPLIT W-CLR ANES SCRN (DRAPES) ×3 IMPLANT
DRAPE INCISE IOBAN 66X45 STRL (DRAPES) ×8 IMPLANT
DRAPE SLUSH/WARMER DISC (DRAPES) ×3 IMPLANT
DRSG COVADERM 4X6 (GAUZE/BANDAGES/DRESSINGS) ×1 IMPLANT
DRSG COVADERM 4X8 (GAUZE/BANDAGES/DRESSINGS) ×3 IMPLANT
ELECT BLADE 6.5 EXT (BLADE) ×3 IMPLANT
ELECT REM PT RETURN 9FT ADLT (ELECTROSURGICAL) ×6
ELECTRODE REM PT RTRN 9FT ADLT (ELECTROSURGICAL) ×4 IMPLANT
FEMORAL VENOUS CANN RAP (CANNULA) IMPLANT
GLOVE BIO SURGEON STRL SZ 6 (GLOVE) ×3 IMPLANT
GLOVE BIOGEL PI IND STRL 6 (GLOVE) IMPLANT
GLOVE BIOGEL PI INDICATOR 6 (GLOVE) ×5
GLOVE ORTHO TXT STRL SZ7.5 (GLOVE) ×9 IMPLANT
GOWN STRL REUS W/ TWL LRG LVL3 (GOWN DISPOSABLE) ×8 IMPLANT
GOWN STRL REUS W/TWL LRG LVL3 (GOWN DISPOSABLE) ×21
GUIDEWIRE ANG ZIPWIRE 038X150 (WIRE) IMPLANT
INSERT CONFORM CROSS CLAMP 66M (MISCELLANEOUS) IMPLANT
INSERT CONFORM CROSS CLAMP 86M (MISCELLANEOUS) IMPLANT
IV NS 1000ML (IV SOLUTION) ×6
IV NS 1000ML BAXH (IV SOLUTION) ×4 IMPLANT
KIT BASIN OR (CUSTOM PROCEDURE TRAY) ×3 IMPLANT
KIT DEVICE SUT COR-KNOT MIS 5 (INSTRUMENTS) ×1 IMPLANT
KIT DILATOR VASC 18G NDL (KITS) ×3 IMPLANT
KIT DRAINAGE VACCUM ASSIST (KITS) ×1 IMPLANT
KIT ROOM TURNOVER OR (KITS) ×3 IMPLANT
KIT SUCTION CATH 14FR (SUCTIONS) ×3 IMPLANT
LEAD PACING MYOCARDI (MISCELLANEOUS) ×3 IMPLANT
LINE VENT (MISCELLANEOUS) ×1 IMPLANT
NDL AORTIC ROOT 14G 7F (CATHETERS) ×2 IMPLANT
NEEDLE AORTIC ROOT 14G 7F (CATHETERS) ×3 IMPLANT
NS IRRIG 1000ML POUR BTL (IV SOLUTION) ×16 IMPLANT
PACK OPEN HEART (CUSTOM PROCEDURE TRAY) ×3 IMPLANT
PAD ARMBOARD 7.5X6 YLW CONV (MISCELLANEOUS) ×6 IMPLANT
PAD ELECT DEFIB RADIOL ZOLL (MISCELLANEOUS) ×3 IMPLANT
PATCH CORMATRIX 4CMX7CM (Prosthesis & Implant Heart) ×1 IMPLANT
RETRACTOR PVM SOFT TISSUE M (INSTRUMENTS) ×1 IMPLANT
RETRACTOR TRL SOFT TISSUE LG (INSTRUMENTS) IMPLANT
RETRACTOR TRM SOFT TISSUE 7.5 (INSTRUMENTS) IMPLANT
RING MITRAL MEMO 3D 34MM SMD34 (Prosthesis & Implant Heart) ×1 IMPLANT
SET CANNULATION TOURNIQUET (MISCELLANEOUS) ×3 IMPLANT
SET CARDIOPLEGIA MPS 5001102 (MISCELLANEOUS) ×1 IMPLANT
SET IRRIG TUBING LAPAROSCOPIC (IRRIGATION / IRRIGATOR) ×3 IMPLANT
SOLUTION ANTI FOG 6CC (MISCELLANEOUS) ×3 IMPLANT
SPONGE GAUZE 4X4 12PLY (GAUZE/BANDAGES/DRESSINGS) ×3 IMPLANT
SUCKER WEIGHTED FLEX (MISCELLANEOUS) ×6 IMPLANT
SUT BONE WAX W31G (SUTURE) ×3 IMPLANT
SUT E-PACK MINIMALLY INVASIVE (SUTURE) ×3 IMPLANT
SUT ETHIBOND (SUTURE) ×2 IMPLANT
SUT ETHIBOND 2 0 SH (SUTURE) ×1 IMPLANT
SUT ETHIBOND 2-0 RB-1 WHT (SUTURE) ×3 IMPLANT
SUT ETHIBOND X763 2 0 SH 1 (SUTURE) ×3 IMPLANT
SUT GORETEX CV 4 TH 22 36 (SUTURE) ×4 IMPLANT
SUT GORETEX CV-5THC-13 36IN (SUTURE) ×9 IMPLANT
SUT GORETEX CV4 TH-18 (SUTURE) ×8 IMPLANT
SUT MNCRL AB 3-0 PS2 18 (SUTURE) IMPLANT
SUT PROLENE 3 0 SH1 36 (SUTURE) ×12 IMPLANT
SUT SILK 2 0 SH CR/8 (SUTURE) IMPLANT
SUT SILK 3 0 SH CR/8 (SUTURE) IMPLANT
SUT VIC AB 2-0 CTX 36 (SUTURE) IMPLANT
SUT VIC AB 3-0 SH 8-18 (SUTURE) ×1 IMPLANT
SUT VICRYL 2 TP 1 (SUTURE) IMPLANT
SYRINGE 10CC LL (SYRINGE) ×3 IMPLANT
SYSTEM SAHARA CHEST DRAIN ATS (WOUND CARE) ×3 IMPLANT
TAPE CLOTH SURG 4X10 WHT LF (GAUZE/BANDAGES/DRESSINGS) ×1 IMPLANT
TAPE STRIPS DRAPE STRL (GAUZE/BANDAGES/DRESSINGS) ×1 IMPLANT
TOWEL OR 17X24 6PK STRL BLUE (TOWEL DISPOSABLE) ×6 IMPLANT
TOWEL OR 17X26 10 PK STRL BLUE (TOWEL DISPOSABLE) ×6 IMPLANT
TRAY FOLEY IC TEMP SENS 16FR (CATHETERS) ×3 IMPLANT
TROCAR XCEL BLADELESS 5X75MML (TROCAR) ×3 IMPLANT
TROCAR XCEL NON-BLD 11X100MML (ENDOMECHANICALS) ×6 IMPLANT
TUBE SUCT INTRACARD DLP 20F (MISCELLANEOUS) ×1 IMPLANT
TUNNELER SHEATH ON-Q 11GX8 DSP (PAIN MANAGEMENT) ×1 IMPLANT
UNDERPAD 30X30 INCONTINENT (UNDERPADS AND DIAPERS) ×3 IMPLANT
WATER STERILE IRR 1000ML POUR (IV SOLUTION) ×6 IMPLANT
WIRE BENTSON .035X145CM (WIRE) ×3 IMPLANT

## 2013-10-11 NOTE — Progress Notes (Signed)
Pt is awake and can follow commands

## 2013-10-11 NOTE — Anesthesia Procedure Notes (Addendum)
Anesthesia Procedure Note PA catheter:  Routine monitors. Timeout, sterile prep, drape, FBP L neck.  Trendelenburg position.  1% [place] local, finder and trocar LIJ 1st pass with US guidance.  Unable to pass cordis sheath. #18ga AC over wire and wire removed. US confirms fresh wire placement as well. Cordis placed over J wire. PA catheter in easily.  Sterile dressing applied.  Patient tolerated well, VSS.  C Jackson, MD   06:30-06:55 Procedure Name: Intubation Performed by: JIANG, FUDAN Pre-anesthesia Checklist: Patient identified, Timeout performed, Emergency Drugs available, Suction available and Patient being monitored Patient Re-evaluated:Patient Re-evaluated prior to inductionPreoxygenation: Pre-oxygenation with 100% oxygen Intubation Type: IV induction Ventilation: Mask ventilation without difficulty Laryngoscope Size: Mac and 3 Grade View: Grade I Tube type: Oral Endobronchial tube: Double lumen EBT and Left and 39 Fr Number of attempts: 1 Airway Equipment and Method: Fiberoptic brochoscope Placement Confirmation: ETT inserted through vocal cords under direct vision,  positive ETCO2,  breath sounds checked- equal and bilateral and CO2 detector Tube secured with: Tape Dental Injury: Teeth and Oropharynx as per pre-operative assessment      

## 2013-10-11 NOTE — Progress Notes (Signed)
[facility] BRIEF SICU PROGRESS NOTE  Day of Surgery  S/P Procedure(s) (LRB): MINIMALLY INVASIVE MITRAL VALVE REPAIR (MVR) (Right) INTRAOPERATIVE TRANSESOPHAGEAL ECHOCARDIOGRAM (N/A)   Awake and alert, following commands Looks ready for extubation NSR - AAI pacing Stable hemodynamics, no drips Excellent UOP Low volume chest tube output  Plan: Continue routine post op.  Wean vent to extubate  OWEN,CLARENCE H 10/11/2013 5:50 PM

## 2013-10-11 NOTE — Progress Notes (Signed)
Utilization Review Completed.Lawrence Underwood T7/24/2015  

## 2013-10-11 NOTE — OR Nursing (Signed)
1st call to SICU charge nurse [phone].

## 2013-10-11 NOTE — Progress Notes (Signed)
Pt extubated to 4L with no complications

## 2013-10-11 NOTE — Brief Op Note (Addendum)
10/11/2013  2:05 PM  PATIENT:  Lawrence Underwood  48 y.o. 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  MV Stenosis: No mitral valve stenosis.  MV Disease Functional Class: MV Disease Functional Class: Type II.  Etiology (Choose at least one and up to five): Degenerative.  MV Lesions (Choose at least one): Leaflet prolapse, posterior. and Elongated/ruptured chords(s).   Mitral/Tricuspid/Pulmonary Valve Procedure  Mitral Valve Procedure Performed:  Repair: Annuloplasty., Leaflet Resection. Resection Type:Triangular. Mitral Leaflet Resection Location: Posterior. and [place]ochrods. Number of Neochords Inserted: 4  Implant: Annuloplasty Device: Implant model number [id number], Size 35mm, Unique Device Identifier [id number].     COMPLICATIONS: None  BASELINE WEIGHT: 88 kg  PATIENT DISPOSITION:   TO SICU IN STABLE CONDITION  Mehlani Blankenburg H 10/11/2013 2:05 PM

## 2013-10-11 NOTE — Op Note (Signed)
CARDIOTHORACIC SURGERY OPERATIVE NOTE  Date of Procedure:  10/11/2013  Preoperative Diagnosis: Severe Mitral Regurgitation  Postoperative Diagnosis: Same  Procedure:    Minimally-Invasive Mitral Valve Repair  Complex valvuloplasty including triangular resection of posterior leaflet  Artificial Gore-tex neocord placement x4  Sorin Memo 3D Ring Annuloplasty (size 34mm, catalog # [id number], serial # [id number])    Surgeon: Salvatore Decent. 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 An echocardiogram was performed demonstrating the presence of mitral valve prolapse with mitral regurgitation. The patient was seen in consultation by Dr. Excell Seltzer and subsequently referred for transesophageal echocardiogram. This confirmed the presence of mitral valve prolapse with a large flail segment of the posterior leaflet and severe mitral regurgitation. The patient was referred for surgical consultation. He was originally seen in consultation on 09/04/2013. Since then he underwent cardiac catheterization by Dr. Excell Seltzer revealing 40% stenosis of the right coronary artery and otherwise nonobstructive coronary artery disease. The patient has been seen in consultation and counseled at length regarding the indications, risks and potential benefits of surgery.  All questions have been answered, and the patient provides full informed consent for the operation as described.     DETAILS  OF THE OPERATIVE PROCEDURE  Preparation:  The patient is brought to the operating room on the above mentioned date and central monitoring was established by the anesthesia team including placement of Swan-Ganz catheter through the left internal jugular vein.  A radial arterial line is placed. The patient is placed in the supine position on the operating table.  Intravenous antibiotics are administered. General endotracheal anesthesia is induced uneventfully. The patient is initially intubated using a dual lumen endotracheal tube.  A Foley catheter is placed.  Baseline transesophageal echocardiogram was performed.  Findings were notable for mitral valve prolapse with an obvious flail segment of the middle scallop (P2) of the posterior leaflet. There was severe (4+) mitral regurgitation. Left ventricular size and systolic function appeared normal. There was trivial aortic insufficiency. The tricuspid valve appeared normal. No other abnormalities were noted.  A soft roll is placed behind the patient's left scapula and the neck gently extended and turned to the left.   The patient's right neck, chest, abdomen, both groins, and both lower extremities are prepared and draped in a sterile manner. A time out procedure is performed.  Surgical Approach:  A right miniature anterolateral thoracotomy incision is performed. The incision is placed just lateral to and superior to the right nipple. The pectoralis major muscle is retracted medially and completely preserved. The right pleural space is entered through the 3rd intercostal space. A soft tissue retractor is placed.  Two 11 mm ports are placed through separate stab incisions inferiorly. The right pleural space is insufflated continuously with carbon dioxide gas through the posterior port during the remainder of the operation.  A pledgeted sutures placed through the dome of the right hemidiaphragm and retracted inferiorly to facilitate exposure.  A longitudinal  incision is made in the pericardium 3 cm anterior to the phrenic nerve and silk traction sutures are placed on either side of the incision for exposure.   Extracorporeal Cardiopulmonary Bypass and Myocardial Protection:  A small incision is made in the right inguinal crease and the anterior surface of the right common femoral artery and right common femoral vein are identified.  The patient is placed in Trendelenburg position. The right internal jugular vein is cannulated with Seldinger technique and a guidewire advanced into the right atrium. The patient is heparinized systemically. The right internal jugular vein is cannulated with a 14 [place]French pediatric femoral venous cannula. Pursestring sutures are placed on the anterior surface of the right common femoral vein and right common femoral artery. The right common femoral vein is cannulated with the Seldinger technique and a guidewire is advanced under transesophageal echocardiogram guidance through the right atrium. The femoral vein is cannulated with a long 22 French femoral venous cannula. The right common femoral artery is cannulated with Seldinger technique and a flexible guidewire is advanced until it can be appreciated intraluminally in the descending thoracic aorta on transesophageal echocardiogram. The femoral artery is cannulated with an 18 French femoral arterial cannula.  Adequate heparinization is verified.     The entire pre-bypass portion of the operation was notable for stable hemodynamics.  Cardiopulmonary bypass was begun.  Vacuum assist venous drainage is utilized. The incision in the pericardium is extended in both directions. Venous drainage and exposure are notably excellent. A retrograde cardioplegia cannula is placed through the right atrium into the coronary sinus using transesophageal echocardiogram guidance.  An antegrade cardioplegia cannula is placed in the ascending aorta.    The patient is cooled to 28C systemic  temperature.  The aortic cross clamp is applied and cold blood cardioplegia is delivered initially in an antegrade fashion through the aortic root.   Supplemental cardioplegia is given retrograde through the coronary sinus catheter. The initial cardioplegic arrest is rapid with early diastolic arrest.  Repeat doses of cardioplegia are administered intermittently every 20 to 30 minutes throughout the entire cross clamp portion of the operation through the aortic root and through the coronary sinus catheter in order to maintain completely flat electrocardiogram.  Myocardial protection was felt to be excellent.   Mitral Valve Repair:  A left atriotomy incision was performed through the interatrial groove and extended partially across the back wall of the left atrium after opening the oblique sinus inferiorly.  The mitral valve is exposed using a self-retaining retractor.  The mitral valve was inspected and notable for forme fruste variant of Barlow's disease with a relatively large valve with billowing leaflets. There was an obvious flail segment of the middle scallop (P2) of the posterior leaflet. There were multiple ruptured primary cords to this segment. There was no significant prolapse involving the anterior leaflet nor the remainder of the posterior leaflet. There was no significant leaflet or annular calcification.  Interrupted 2-0 Ethibond horizontal mattress sutures are placed circumferentially around the entire mitral valve annulus. The sutures will ultimately be utilized for ring annuloplasty, and at this juncture there are utilized to suspend the valve symmetrically.  A pledgeted CV 4 Gore-Tex suture was placed through the head of the posterior papillary muscle in a horizontal mattress fashion and tied. Each limb of this suture will subsequently be utilized for Gore-Tex neo-cord placement. Similarly, a second pledgeted CV 4 Gore-Tex sutures placed through the head of the anterior papillary muscle  and tied in horizontal mattress  fashion.  The flail segment of the middle scallop (P2) is repaired using a triangular resection. Approximately 40% of the total surface area of P2 is resected. The intervening vertical defect in the leaflet was closed using simple interrupted everting CV 5 Gore-Tex suture.  The individual limbs of the Goretex neocords were retrieved from the LV chamber, woven into the posterior leaflet beginning at the free margin where they were placed from the ventricular surface to the atrial surface, and then woven in a diamond shaped fashion towards the posterior mitral annulus.  The Goretex sutures were then tied while the LV was distended with saline so as to adjust the length of the neocords to the appropriate length.  The valve was tested with saline and appeared competent even without ring annuloplasty complete. The valve was sized to a 34 mm annuloplasty ring, based upon the transverse distance between the left and right commissures and the height of the anterior leaflet, corresponding to a size just slightly larger than the overall surface area of the anterior leaflet.  A Sorin Memo 3D annuloplasty ring (size 45mm, catalog [id number], serial [id number]) was secured in place uneventfully. All ring sutures were secured using a Cor-knot device.  The valve was tested with saline and appeared competent.  However, the posterior leaflet looked to be slightly tall in the midline. To correct for this the posterior leaflet was plicated in the midline using several interrupted horizontal mattress 2-0 Ethibond sutures to plicate posterior aspect of P2 to the annuloplasty ring.  The valve is again tested with saline and appears to be perfectly competent with a broad symmetrical line of coaptation of the anterior and posterior leaflet. There is no residual leak. There was a broad, symmetrical line of coaptation of the anterior and posterior leaflet which was confirmed using the blue ink test.   Rewarming is begun.   Procedure Completion:  The atriotomy was closed using a 2-layer closure of running 3-0 Prolene suture after placing a sump drain across the mitral valve to serve as a left ventricular vent.  One final dose of warm retrograde "hot shot" cardioplegia was administered retrograde through the coronary sinus catheter while all air was evacuated through the aortic root.  The aortic cross clamp was removed after a total cross clamp time of 136 minutes.  Epicardial pacing wires are fixed to the inferior wall of the right ventricule and to the right atrial appendage. The patient is rewarmed to 37C temperature. The left ventricular vent is removed.  The patient is ventilated and flow volumes turndown while the mitral valve repair is inspected using transesophageal echocardiogram. The valve repair appears intact with no residual leak. The antegrade cardioplegia cannula is now removed. The patient is weaned and disconnected from cardiopulmonary bypass.  The patient's rhythm at separation from bypass was AV paced.  The patient was weaned from bypass without any inotropic support. Total cardiopulmonary bypass time for the operation was 182 minutes.  Followup transesophageal echocardiogram performed after separation from bypass revealed a well-seated annuloplasty ring in the mitral position with a normal functioning mitral valve. There was no residual leak.  Left ventricular function was unchanged from preoperatively.    The femoral arterial and venous cannulae were removed uneventfully. There was a palpable pulse in the distal right common femoral artery after removal of the cannula. Protamine was administered to reverse the anticoagulation. The right internal jugular cannula was removed and manual pressure held on the neck for 15 minutes.  Single lung ventilation was begun.  The atriotomy closure was inspected for hemostasis. The pericardial sac was drained using a 28 French Bard drain placed  through the anterior port incision.  The pericardium was closed using a patch of core matrix bovine submucosal tissue patch. The right pleural space is irrigated with saline solution and inspected for hemostasis. The right pleural space was drained using a 28 French Bard drain placed through the posterior port incision. The miniature thoracotomy incision was closed in multiple layers in routine fashion. The right groin incision was inspected for hemostasis and closed in multiple layers in routine fashion.  The post-bypass portion of the operation was notable for stable rhythm and hemodynamics.  No blood products were administered during the operation.   Disposition:  The patient tolerated the procedure well.  The patient was reintubated using a single lumen endotracheal tube and subsequently transported to the surgical intensive care unit in stable condition. There were no intraoperative complications. All sponge instrument and needle counts are verified correct at completion of the operation.     Salvatore Decent. Cornelius Moras MD 10/11/2013 2:12 PM

## 2013-10-11 NOTE — Progress Notes (Signed)
Decreased settings due to protocol

## 2013-10-11 NOTE — Procedures (Signed)
Extubation Procedure Note  Patient Details:   Name: Lawrence Underwood DOB: Jul 30, 1965 MRN: [id number]   Airway Documentation:     Evaluation  O2 sats: 94 Complications: No apparent complications Patient did tolerate procedure well. Bilateral Breath Sounds: Clear   Yes. Pt is awake and can follow commands for RT and RN. Deep oral sxn done. NIF -40, VC 900 with good effort. Pt has positive cuff leak. Extubated pt to 4L [place] with no complications. Sats 94 on 4L, BBS clr, NPC, HR 80, RR 16. Pt is in no complication. IS 750 X 5. Pt tol well  Kandis Nab 10/11/2013, 6:01 PM

## 2013-10-11 NOTE — Anesthesia Preprocedure Evaluation (Addendum)
Anesthesia Evaluation  Patient identified by MRN, date of birth, ID band Patient awake    Reviewed: Allergy & Precautions, H&P , NPO status , Patient's Chart, lab work & pertinent test results  Airway Mallampati: II  Neck ROM: full    Dental   Pulmonary neg pulmonary ROS,          Cardiovascular + CAD + Valvular Problems/Murmurs MR  Non-obstructive CAD by cath. Normal LV function.  Severe MR   Neuro/Psych    GI/Hepatic hiatal hernia, GERD-  ,  Endo/Other    Renal/GU      Musculoskeletal   Abdominal   Peds  Hematology   Anesthesia Other Findings   Reproductive/Obstetrics                           Anesthesia Physical Anesthesia Plan  ASA: III  Anesthesia Plan: General   Post-op Pain Management:    Induction: Intravenous  Airway Management Planned: Oral ETT  Additional Equipment: Arterial line, CVP, PA Cath, 3D TEE and Ultrasound Guidance Line Placement  Intra-op Plan:   Post-operative Plan: Post-operative intubation/ventilation  Informed Consent: I have reviewed the patients History and Physical, chart, labs and discussed the procedure including the risks, benefits and alternatives for the proposed anesthesia with the patient or authorized representative who has indicated his/her understanding and acceptance.   Dental advisory given  Plan Discussed with: CRNA, Anesthesiologist and Surgeon  Anesthesia Plan Comments:        Anesthesia Quick Evaluation

## 2013-10-11 NOTE — OR Nursing (Signed)
2nd call to [facility] in SICU 1400.

## 2013-10-11 NOTE — Interval H&P Note (Signed)
History and Physical Interval Note:  10/11/2013 6:58 AM  Lawrence Underwood  has presented today for surgery, with the diagnosis of MR  The various methods of treatment have been discussed with the patient and family. After consideration of risks, benefits and other options for treatment, the patient has consented to  Procedure(s): MINIMALLY INVASIVE MITRAL VALVE REPAIR (MVR) (Right) INTRAOPERATIVE TRANSESOPHAGEAL ECHOCARDIOGRAM (N/A) as a surgical intervention .  The patient's history has been reviewed, patient examined, no change in status, stable for surgery.  I have reviewed the patient's chart and labs.  Questions were answered to the patient's satisfaction.     Andalyn Heckstall H

## 2013-10-11 NOTE — Transfer of Care (Signed)
Immediate Anesthesia Transfer of Care Note  Patient: Lawrence Underwood  Procedure(s) Performed: Procedure(s): MINIMALLY INVASIVE MITRAL VALVE REPAIR (MVR) (Right) INTRAOPERATIVE TRANSESOPHAGEAL ECHOCARDIOGRAM (N/A)  Patient Location: ICU  Anesthesia Type:General  Level of Consciousness: Patient remains intubated per anesthesia plan  Airway & Oxygen Therapy: Patient remains intubated per anesthesia plan  Post-op Assessment: Report given to PACU RN and Post -op Vital signs reviewed and stable  Post vital signs: Reviewed and stable  Complications: No apparent anesthesia complications

## 2013-10-11 NOTE — Progress Notes (Signed)
  Echocardiogram Echocardiogram Transesophageal has been performed.  Lawrence Underwood M 10/11/2013, 10:40 AM

## 2013-10-12 ENCOUNTER — Inpatient Hospital Stay (HOSPITAL_COMMUNITY): Payer: BC Managed Care – PPO

## 2013-10-12 LAB — GLUCOSE, CAPILLARY
GLUCOSE-CAPILLARY: 122 mg/dL — AB (ref 70–99)
Glucose-Capillary: 155 mg/dL — ABNORMAL HIGH (ref 70–99)
Glucose-Capillary: 133 mg/dL — ABNORMAL HIGH (ref 70–99)
Glucose-Capillary: 125 mg/dL — ABNORMAL HIGH (ref 70–99)
Glucose-Capillary: 119 mg/dL — ABNORMAL HIGH (ref 70–99)
Glucose-Capillary: 130 mg/dL — ABNORMAL HIGH (ref 70–99)

## 2013-10-12 LAB — BASIC METABOLIC PANEL
ANION GAP: 12 (ref 5–15)
BUN: 12 mg/dL (ref 6–23)
CHLORIDE: 105 meq/L (ref 96–112)
CO2: 22 mEq/L (ref 19–32)
Calcium: 7.5 mg/dL — ABNORMAL LOW (ref 8.4–10.5)
Creatinine, Ser: 0.94 mg/dL (ref 0.50–1.35)
GFR calc Af Amer: >90 mL/min (ref >90)
GFR calc non Af Amer: >90 mL/min (ref >90)
GLUCOSE: 117 mg/dL — AB (ref 70–99)
POTASSIUM: 3.9 meq/L (ref 3.7–5.3)
SODIUM: 139 meq/L (ref 137–147)

## 2013-10-12 LAB — CBC
HCT: 31.9 % — ABNORMAL LOW (ref 39.0–52.0)
HEMOGLOBIN: 10.7 g/dL — AB (ref 13.0–17.0)
MCH: 27.6 pg (ref 26.0–34.0)
MCHC: 33.5 g/dL (ref 30.0–36.0)
MCV: 82.2 fL (ref 78.0–100.0)
Platelets: 172 10*3/uL (ref 150–400)
RBC: 3.88 MIL/uL — ABNORMAL LOW (ref 4.22–5.81)
RDW: 12.6 % (ref 11.5–15.5)
WBC: 11.6 10*3/uL — ABNORMAL HIGH (ref 4.0–10.5)

## 2013-10-12 LAB — POCT I-STAT 3, ART BLOOD GAS (G3+)
ACID-BASE DEFICIT: 2 mmol/L (ref 0.0–2.0)
Bicarbonate: 23.1 mEq/L (ref 20.0–24.0)
O2 SAT: 97 %
TCO2: 24 mmol/L (ref 0–100)
pCO2 arterial: 40.1 mmHg (ref 35.0–45.0)
pH, Arterial: 7.372 (ref 7.350–7.450)
pO2, Arterial: 97 mmHg (ref 80.0–100.0)

## 2013-10-12 LAB — MAGNESIUM: MAGNESIUM: 2.3 mg/dL (ref 1.5–2.5)

## 2013-10-12 MED ORDER — SODIUM CHLORIDE 0.9 % IV SOLN: 250.0000 mL | INTRAVENOUS | Status: DC | PRN

## 2013-10-12 MED ORDER — SODIUM CHLORIDE 0.9 % IJ SOLN: 3.0000 mL | INTRAMUSCULAR | Status: DC | PRN

## 2013-10-12 MED ORDER — INSULIN ASPART 100 UNIT/ML ~~LOC~~ SOLN
0.0000 [IU] | SUBCUTANEOUS | Status: DC
  Administered 2013-10-12 (×2): 2 [IU] via SUBCUTANEOUS

## 2013-10-12 MED ORDER — WARFARIN SODIUM 2.5 MG PO TABS
2.5000 mg | ORAL_TABLET | Freq: Every day | ORAL | Status: DC
  Administered 2013-10-12 – 2013-10-13 (×2): 2.5 mg via ORAL
  Filled 2013-10-12 (×3): qty 1

## 2013-10-12 MED ORDER — FUROSEMIDE 40 MG PO TABS
40.0000 mg | ORAL_TABLET | Freq: Two times a day (BID) | ORAL | Status: AC
  Administered 2013-10-13 – 2013-10-14 (×4): 40 mg via ORAL
  Filled 2013-10-12 (×4): qty 1

## 2013-10-12 MED ORDER — POTASSIUM CHLORIDE CRYS ER 20 MEQ PO TBCR
20.0000 meq | EXTENDED_RELEASE_TABLET | Freq: Two times a day (BID) | ORAL | Status: AC
  Administered 2013-10-13 – 2013-10-14 (×4): 20 meq via ORAL
  Filled 2013-10-12 (×4): qty 1

## 2013-10-12 MED ORDER — SODIUM CHLORIDE 0.9 % IJ SOLN
3.0000 mL | Freq: Two times a day (BID) | INTRAMUSCULAR | Status: DC
  Administered 2013-10-12 – 2013-10-15 (×5): 3 mL via INTRAVENOUS

## 2013-10-12 MED ORDER — MORPHINE SULFATE 2 MG/ML IJ SOLN: 2.0000 mg | INTRAMUSCULAR | Status: DC | PRN

## 2013-10-12 MED ORDER — MOVING RIGHT ALONG BOOK
Freq: Once | Status: AC
  Administered 2013-10-12: 11:00:00
  Filled 2013-10-12: qty 1

## 2013-10-12 MED ORDER — WARFARIN - PHYSICIAN DOSING INPATIENT
Freq: Every day | Status: DC
  Administered 2013-10-14: 18:00:00

## 2013-10-12 MED ORDER — TRAMADOL HCL 50 MG PO TABS: 50.0000 mg | ORAL_TABLET | ORAL | Status: DC | PRN

## 2013-10-12 NOTE — Progress Notes (Signed)
[facility] BRIEF SICU PROGRESS NOTE  1 Day Post-Op  S/P Procedure(s) (LRB): MINIMALLY INVASIVE MITRAL VALVE REPAIR (MVR) (Right) INTRAOPERATIVE TRANSESOPHAGEAL ECHOCARDIOGRAM (N/A)   Stable day Maintaining NSR  Plan: Awaiting bed on step down unit for transfer  Lawrence Underwood 10/12/2013 5:58 PM

## 2013-10-12 NOTE — Progress Notes (Signed)
[address]       [name] [address]             [phone]        CARDIOTHORACIC SURGERY PROGRESS NOTE   R1 Day Post-Op Procedure(s) (LRB): MINIMALLY INVASIVE MITRAL VALVE REPAIR (MVR) (Right) INTRAOPERATIVE TRANSESOPHAGEAL ECHOCARDIOGRAM (N/A)  Subjective: Looks very good.  Reports mild soreness in chest but he hasn't required any pain meds.  No SOB.  No nausea  Objective: Vital signs: BP Readings from Last 1 Encounters:  10/12/13 102/56   Pulse Readings from Last 1 Encounters:  10/12/13 79   Resp Readings from Last 1 Encounters:  10/12/13 21   Temp Readings from Last 1 Encounters:  10/12/13 99.5 F (37.5 C)     Hemodynamics: PAP: (22-56)/(12-24) 41/18 mmHg CO:  [5.7 L/min-8.8 L/min] 8.8 L/min CI:  [2.7 L/min/m2-4.3 L/min/m2] 4.3 L/min/m2  Physical Exam:  Rhythm:   Sinus 60 - AAI paced  Breath sounds: clear  Heart sounds:  RRR w/out murmur  Incisions:  Dressing dry, intact  Abdomen:  Soft, non-distended, non-tender  Extremities:  Warm, well-perfused    Intake/Output from previous day: 07/24 0701 - 07/25 0700 In: 6411.4 [I.V.:4461.4; Blood:400; IV Piggyback:1550] Out: 5750 [Urine:4030; Blood:1250; Chest Tube:470] Intake/Output this shift: Total I/O In: 145 [I.V.:95; IV Piggyback:50] Out: 165 [Urine:125; Chest Tube:40]  Lab Results:  CBC: Recent Labs  10/11/13 2130 10/12/13 0415  WBC 12.8* 11.6*  HGB 12.2* 10.7*  HCT 35.6* 31.9*  PLT 181 172    BMET:  Recent Labs  10/09/13 1430  10/11/13 2124 10/11/13 2130 10/12/13 0415  NA 135*  < > 138  --  139  K 3.8  < > 4.0  --  3.9  CL 100  < > 109  --  105  CO2 19  --   --   --  22  GLUCOSE 100*  < > 139*  --  117*  BUN 10  < > 10  --  12  CREATININE 0.89  < > 0.80 0.89 0.94  CALCIUM 9.0  --   --   --  7.5*  < > = values in this interval not displayed.   CBG (last 3)   Recent Labs  10/12/13 0002 10/12/13 0413 10/12/13 0755  GLUCAP 155* 122* 133*    ABG      Component Value Date/Time   PHART 7.372 10/12/2013 0403   PCO2ART 40.1 10/12/2013 0403   PO2ART 97.0 10/12/2013 0403   HCO3 23.1 10/12/2013 0403   TCO2 24 10/12/2013 0403   ACIDBASEDEF 2.0 10/12/2013 0403   O2SAT 97.0 10/12/2013 0403    CXR: PORTABLE CHEST - 1 VIEW  COMPARISON: the previous day's study  FINDINGS:  The patient has been extubated and the nasogastric tube removed.  Left IJ Swan-Ganz remains in the right pulmonary artery. Two right  chest tubes remain in place with no pneumothorax. There is some  patchy atelectasis at the right lung base, improved from previous  exam. No effusion. Left lung clear. Heart size upper limits normal  for technique.  IMPRESSION:  1. Interval extubation with improving aeration at the right lung  base.  2. Stable right chest tubes with no pneumothorax.  Electronically Signed  By: Oley Balm M.D.  On: 10/12/2013 08:57   Assessment/Plan: S/P Procedure(s) (LRB): MINIMALLY INVASIVE MITRAL VALVE REPAIR (MVR) (Right) INTRAOPERATIVE TRANSESOPHAGEAL ECHOCARDIOGRAM (N/A)  Doing very well POD1 Maintaining NSR w/ stable hemodynamics off all drips Expected post  op acute blood loss anemia, very mild Expected post op volume excess, mild Expected post op atelectasis, very mild   Mobilize  Diuresis  Keep chest tubes in place  Start coumadin  Transfer step down   Mitsuru Dault H 10/12/2013 10:27 AM

## 2013-10-13 ENCOUNTER — Inpatient Hospital Stay (HOSPITAL_COMMUNITY): Payer: BC Managed Care – PPO

## 2013-10-13 LAB — BASIC METABOLIC PANEL
Anion gap: 12 (ref 5–15)
BUN: 10 mg/dL (ref 6–23)
CO2: 24 mEq/L (ref 19–32)
Calcium: 8 mg/dL — ABNORMAL LOW (ref 8.4–10.5)
Chloride: 105 mEq/L (ref 96–112)
Creatinine, Ser: 0.8 mg/dL (ref 0.50–1.35)
GFR calc Af Amer: >90 mL/min (ref >90)
Glucose, Bld: 104 mg/dL — ABNORMAL HIGH (ref 70–99)
POTASSIUM: 3.9 meq/L (ref 3.7–5.3)
SODIUM: 141 meq/L (ref 137–147)

## 2013-10-13 LAB — GLUCOSE, CAPILLARY
GLUCOSE-CAPILLARY: 112 mg/dL — AB (ref 70–99)
Glucose-Capillary: 108 mg/dL — ABNORMAL HIGH (ref 70–99)
Glucose-Capillary: 110 mg/dL — ABNORMAL HIGH (ref 70–99)
Glucose-Capillary: 111 mg/dL — ABNORMAL HIGH (ref 70–99)

## 2013-10-13 LAB — CBC
HCT: 37.3 % — ABNORMAL LOW (ref 39.0–52.0)
HEMOGLOBIN: 12.4 g/dL — AB (ref 13.0–17.0)
MCH: 27.9 pg (ref 26.0–34.0)
MCHC: 33.2 g/dL (ref 30.0–36.0)
MCV: 83.8 fL (ref 78.0–100.0)
Platelets: 159 10*3/uL (ref 150–400)
RBC: 4.45 MIL/uL (ref 4.22–5.81)
RDW: 12.8 % (ref 11.5–15.5)
WBC: 12.1 10*3/uL — ABNORMAL HIGH (ref 4.0–10.5)

## 2013-10-13 LAB — PROTIME-INR
INR: 1.27 (ref 0.00–1.49)
PROTHROMBIN TIME: 15.9 s — AB (ref 11.6–15.2)

## 2013-10-13 MED ORDER — POTASSIUM CHLORIDE CRYS ER 20 MEQ PO TBCR
40.0000 meq | EXTENDED_RELEASE_TABLET | Freq: Once | ORAL | Status: AC
  Administered 2013-10-13: 40 meq via ORAL
  Filled 2013-10-13: qty 2

## 2013-10-13 MED ORDER — ASPIRIN EC 81 MG PO TBEC
81.0000 mg | DELAYED_RELEASE_TABLET | Freq: Every day | ORAL | Status: DC
Start: 2013-10-14 — End: 2013-10-15
  Administered 2013-10-14 – 2013-10-15 (×2): 81 mg via ORAL
  Filled 2013-10-13 (×2): qty 1

## 2013-10-13 NOTE — Progress Notes (Addendum)
      [address]       [name] [address]             [phone]        CARDIOTHORACIC SURGERY PROGRESS NOTE   R2 Days Post-Op Procedure(s) (LRB): MINIMALLY INVASIVE MITRAL VALVE REPAIR (MVR) (Right) INTRAOPERATIVE TRANSESOPHAGEAL ECHOCARDIOGRAM (N/A)  Subjective: Feels well.  Minimal soreness.  Not taking any pain meds.  Marginal appetite.  Objective: Vital signs: BP Readings from Last 1 Encounters:  10/13/13 126/67   Pulse Readings from Last 1 Encounters:  10/13/13 62   Resp Readings from Last 1 Encounters:  10/13/13 20   Temp Readings from Last 1 Encounters:  10/13/13 97.4 F (36.3 C) Oral    Hemodynamics: PAP: (50)/(24) 50/24 mmHg  Physical Exam:  Rhythm:   sinus  Breath sounds: clear  Heart sounds:  RRR w/out murmur  Incisions:  Clean and dry  Abdomen:  Soft, non-distended, non-tender  Extremities:  Warm, well-perfused    Intake/Output from previous day: 07/25 0701 - 07/26 0700 In: 886.3 [P.O.:600; I.V.:186.3; IV Piggyback:100] Out: 2430 [Urine:2000; Chest Tube:430] Intake/Output this shift: Total I/O In: 650 [P.O.:600; IV Piggyback:50] Out: 170 [Urine:150; Chest Tube:20]  Lab Results:  CBC: Recent Labs  10/12/13 0415 10/13/13 0245  WBC 11.6* 12.1*  HGB 10.7* 12.4*  HCT 31.9* 37.3*  PLT 172 159    BMET:  Recent Labs  10/12/13 0415 10/13/13 0245  NA 139 141  K 3.9 3.9  CL 105 105  CO2 22 24  GLUCOSE 117* 104*  BUN 12 10  CREATININE 0.94 0.80  CALCIUM 7.5* 8.0*     CBG (last 3)   Recent Labs  10/13/13 0001 10/13/13 0411 10/13/13 0832  GLUCAP 108* 112* 111*    ABG    Component Value Date/Time   PHART 7.372 10/12/2013 0403   PCO2ART 40.1 10/12/2013 0403   PO2ART 97.0 10/12/2013 0403   HCO3 23.1 10/12/2013 0403   TCO2 24 10/12/2013 0403   ACIDBASEDEF 2.0 10/12/2013 0403   O2SAT 97.0 10/12/2013 0403    CXR: PORTABLE CHEST - 1 VIEW  COMPARISON: October 12, 2013  FINDINGS:  The Swan-Ganz catheter has  been removed. Right chest tubes remain in  place. Temporary pacemaker wires are attached to the right heart. No  apparent pneumothorax.  There is patchy atelectasis and consolidation in the right base,  increased. Elsewhere lungs are clear. Heart is mildly enlarged with  pulmonary vascularity within normal limits. No adenopathy. Patient  is status post mitral valve replacement.  IMPRESSION:  New patchy infiltrate right base as well as patchy atelectasis in  the right base, slightly increased. Left lung clear. Heart prominent  but stable. No pneumothorax apparent.  Electronically Signed  By: Bretta Bang M.D.  On: 10/13/2013 07:24   Assessment/Plan: S/P Procedure(s) (LRB): MINIMALLY INVASIVE MITRAL VALVE REPAIR (MVR) (Right) INTRAOPERATIVE TRANSESOPHAGEAL ECHOCARDIOGRAM (N/A)  Doing very well POD2 Maintaining NSR Expected post op acute blood loss anemia, very mild Expected post op volume excess, mild Expected post op atelectasis, very mild Still waiting for bed to transfer     Mobilize  Diuresis  Keep chest tubes in place 1 more day  D/C pacing wires  Continue coumadin  Transfer 2W instead of PTCU  Anticipate d/c home Tuesday  OWEN,CLARENCE H 10/13/2013 10:30 AM

## 2013-10-14 LAB — PROTIME-INR
INR: 1.15 (ref 0.00–1.49)
Prothrombin Time: 14.7 seconds (ref 11.6–15.2)

## 2013-10-14 MED ORDER — WARFARIN SODIUM 5 MG PO TABS
5.0000 mg | ORAL_TABLET | Freq: Every day | ORAL | Status: DC
  Administered 2013-10-14: 5 mg via ORAL
  Filled 2013-10-14 (×2): qty 1

## 2013-10-14 MED ORDER — COUMADIN BOOK
Freq: Once | Status: DC
  Filled 2013-10-14 (×2): qty 1

## 2013-10-14 MED ORDER — AMIODARONE HCL 200 MG PO TABS
200.0000 mg | ORAL_TABLET | Freq: Two times a day (BID) | ORAL | Status: DC
  Filled 2013-10-14 (×2): qty 1

## 2013-10-14 MED ORDER — AMIODARONE HCL 200 MG PO TABS
200.0000 mg | ORAL_TABLET | Freq: Every day | ORAL | Status: DC
  Filled 2013-10-14: qty 1

## 2013-10-14 MED ORDER — WARFARIN VIDEO
Freq: Once | Status: AC
  Administered 2013-10-14: 17:00:00

## 2013-10-14 MED FILL — Sodium Bicarbonate IV Soln 8.4%: INTRAVENOUS | Qty: 50 | Status: AC

## 2013-10-14 MED FILL — Lidocaine HCl IV Inj 20 MG/ML: INTRAVENOUS | Qty: 5 | Status: AC

## 2013-10-14 MED FILL — Heparin Sodium (Porcine) Inj 1000 Unit/ML: INTRAMUSCULAR | Qty: 10 | Status: AC

## 2013-10-14 MED FILL — Sodium Chloride IV Soln 0.9%: INTRAVENOUS | Qty: 2000 | Status: AC

## 2013-10-14 MED FILL — Electrolyte-R (PH 7.4) Solution: INTRAVENOUS | Qty: 5000 | Status: AC

## 2013-10-14 MED FILL — Mannitol IV Soln 20%: INTRAVENOUS | Qty: 500 | Status: AC
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
## 2013-10-14 NOTE — Progress Notes (Signed)
CARDIAC REHAB PHASE I   PRE:  Rate/Rhythm: 63 SR    BP: sitting 130/70    SaO2: 97 Ra  MODE:  Ambulation: 860 ft   POST:  Rate/Rhythm: 64 SR    BP: sitting 140/70     SaO2: 98 Ra  Tolerated very well. No c/o except that he could feel HR "racing", rhythm stable, 64 SR. This was atleast his second long walk today. Will not need RW once CT out. Will f/u am, thinking about CRPII. [phone]   Elissa Lovett [place] CES, ACSM 10/14/2013 10:52 AM

## 2013-10-14 NOTE — Progress Notes (Signed)
Removed Y chest tube on right side.  Pt sutures tied and petroleum gauze applied.  Pt tolerated procedure well and will call if need arises. Pt resting with call bell within reach.  Will continue to monitor. Thomas Hoff, RN

## 2013-10-14 NOTE — Progress Notes (Addendum)
      [address]       [name] [address]             [phone]    3 Days Post-Op Procedure(s) (LRB): MINIMALLY INVASIVE MITRAL VALVE REPAIR (MVR) (Right) INTRAOPERATIVE TRANSESOPHAGEAL ECHOCARDIOGRAM (N/A)  Subjective:  Mr. Iwanicki has no complaints this morning.  + ambulation + BM  Objective: Vital signs in last 24 hours: Temp:  [97.5 F (36.4 C)-99.1 F (37.3 C)] 99.1 F (37.3 C) (07/27 0445) Pulse Rate:  [62-69] 63 (07/27 0445) Cardiac Rhythm:  [-] Normal sinus rhythm (07/26 2030) Resp:  [18-20] 18 (07/27 0445) BP: (120-145)/(59-75) 127/59 mmHg (07/27 0445) SpO2:  [95 %-97 %] 97 % (07/27 0445) Weight:  [194 lb 1.6 oz (88.043 kg)] 194 lb 1.6 oz (88.043 kg) (07/27 0445)  Intake/Output from previous day: 07/26 0701 - 07/27 0700 In: 770 [P.O.:720; IV Piggyback:50] Out: 671 [Urine:500; Stool:1; Chest Tube:170]  General appearance: alert, cooperative and no distress Heart: regular rate and rhythm Lungs: clear to auscultation bilaterally Abdomen: soft, non-tender; bowel sounds normal; no masses,  no organomegaly Extremities: extremities normal, atraumatic, no cyanosis or edema Wound: clean and dry  Lab Results:  Recent Labs  10/12/13 0415 10/13/13 0245  WBC 11.6* 12.1*  HGB 10.7* 12.4*  HCT 31.9* 37.3*  PLT 172 159   BMET:  Recent Labs  10/12/13 0415 10/13/13 0245  NA 139 141  K 3.9 3.9  CL 105 105  CO2 22 24  GLUCOSE 117* 104*  BUN 12 10  CREATININE 0.94 0.80  CALCIUM 7.5* 8.0*    PT/INR:  Recent Labs  10/14/13 0530  LABPROT 14.7  INR 1.15   ABG    Component Value Date/Time   PHART 7.372 10/12/2013 0403   HCO3 23.1 10/12/2013 0403   TCO2 24 10/12/2013 0403   ACIDBASEDEF 2.0 10/12/2013 0403   O2SAT 97.0 10/12/2013 0403   CBG (last 3)   Recent Labs  10/13/13 0411 10/13/13 0832 10/13/13 1224  GLUCAP 112* 111* 110*    Assessment/Plan: S/P Procedure(s) (LRB): MINIMALLY INVASIVE MITRAL VALVE REPAIR (MVR)  (Right) INTRAOPERATIVE TRANSESOPHAGEAL ECHOCARDIOGRAM (N/A)  1. CV- Sinus Bradycardia- tolerating Lopressor, no Amiodarone at discharge 2. Pulm- d/c chest tube today, off oxygen, continue IS 3. Renal- volume status is at baseline, no LE edema will give lasix today, however none will be needed at discharge 4. INR 1.15- will increase dose to 5 mg of Coumadin 5. Dispo- patient doing well, will plan for d/c in AM   LOS: 3 days    BARRETT, ERIN 10/14/2013  I have seen and examined the patient and agree with the assessment and plan as outlined.  Will not resume amiodarone but continue low dose beta blocker.  Continue low dose ASA until therapeutic on coumadin.  Approaching euvolemia.  Will not d/c home on lasix.  Deborahann Poteat H 10/14/2013 9:16 AM

## 2013-10-14 NOTE — Discharge Instructions (Addendum)
Mitral Valve Repair, Care After Refer to this sheet in the next few weeks. These instructions provide you with information on caring for yourself after your procedure. Your health care provider may also give you specific instructions. Your treatment has been planned according to current medical practices, but problems sometimes occur. Call your health care provider if you have any problems or questions after your procedure.  HOME CARE INSTRUCTIONS   Take medicines only as directed by your health care provider.  Take your temperature every morning for the first 7 days after surgery. Write these down.  Weigh yourself every morning for at least 7 days after surgery. Write your weight down.  Wear elastic stockings during the day for at least 2 weeks after surgery or as directed by your health care provider. Use them longer if your ankles are swollen. The stockings help blood flow and help reduce swelling in the legs.  Take frequent naps or rest often throughout the day.  Avoid lifting more than 10 lb (4.5 kg) or pushing or pulling things with your arms for 6-8 weeks or as directed by your health care provider.  Avoid driving or airplane travel for 4-6 weeks after surgery or as directed. If you are riding in a car for an extended period, stop every 1-2 hours to stretch your legs.  Avoid crossing your legs.  Avoid climbing stairs and using the handrail to pull yourself up for the first 2-3 weeks after surgery.  Do not take baths for 2-4 weeks after surgery. Take showers once your health care provider approves. Pat incisions dry. Do not rub incisions with a washcloth or towel.  Return to work as directed by your health care provider.  Drink enough fluids to keep your urine clear or pale yellow.  Do not strain to have a bowel movement. Eat high-fiber foods if you become constipated. You may also take a medicine to help you have a bowel movement (laxative) as directed by your health care  provider.  Resume sexual activity as directed by your health care provider. SEEK MEDICAL CARE IF:   You develop a skin rash.   Your weight is increasing each day over 2-3 days.  Your weight increases by 2 or more pounds (1 kg) in a single day.  You have a fever. SEEK IMMEDIATE MEDICAL CARE IF:   You develop chest pain that is not coming from your incision.  You develop shortness of breath or difficulty breathing.  You have drainage, redness, swelling, or pain at your incision site.  You have pus coming from your incision.  You develop light-headedness. MAKE SURE YOU:  Understand these directions.  Will watch your condition.  Will get help right away if you are not doing well or get worse. Document Released: 09/24/2004 Document Revised: 07/22/2013 Document Reviewed: 08/07/2012 [facility] Patient Information 2015 [employer] [place]. This information is not intended to replace advice given to you by your health care provider. Make sure you discuss any questions you have with your health care provider.  Information on my medicine - Coumadin   (Warfarin)  This medication education was reviewed with me or my healthcare representative as part of my discharge preparation.  The pharmacist that spoke with me during my hospital stay was:  Arman FilterClark, Ruth Prescott, [facility] Center[facility]PH  Why was Coumadin prescribed for you? Coumadin was prescribed for you because you have a blood clot or a medical condition that can cause an increased risk of forming blood clots. Blood clots can cause serious health  problems by blocking the flow of blood to the heart, lung, or brain. Coumadin can prevent harmful blood clots from forming. As a reminder your indication for Coumadin is:   Select from menu  What test will check on my response to Coumadin? While on Coumadin (warfarin) you will need to have an INR test regularly to ensure that your dose is keeping you in the desired range. The INR (international normalized ratio)  number is calculated from the result of the laboratory test called prothrombin time (PT).  If an INR APPOINTMENT HAS NOT ALREADY BEEN MADE FOR YOU please schedule an appointment to have this lab work done by your health care provider within 7 days. Your INR goal is usually a number between:  2 to 3 or your provider may give you a more narrow range like 2-2.5.  Ask your health care provider during an office visit what your goal INR is.  What  do you need to  know  About  COUMADIN? Take Coumadin (warfarin) exactly as prescribed by your healthcare provider about the same time each day.  DO NOT stop taking without talking to the doctor who prescribed the medication.  Stopping without other blood clot prevention medication to take the place of Coumadin may increase your risk of developing a new clot or stroke.  Get refills before you run out.  What do you do if you miss a dose? If you miss a dose, take it as soon as you remember on the same day then continue your regularly scheduled regimen the next day.  Do not take two doses of Coumadin at the same time.  Important Safety Information A possible side effect of Coumadin (Warfarin) is an increased risk of bleeding. You should call your healthcare provider right away if you experience any of the following:   Bleeding from an injury or your nose that does not stop.   Unusual colored urine (red or dark brown) or unusual colored stools (red or black).   Unusual bruising for unknown reasons.   A serious fall or if you hit your head (even if there is no bleeding).  Some foods or medicines interact with Coumadin (warfarin) and might alter your response to warfarin. To help avoid this:   Eat a balanced diet, maintaining a consistent amount of Vitamin K.   Notify your provider about major diet changes you plan to make.   Avoid alcohol or limit your intake to 1 drink for women and 2 drinks for men per day. (1 drink is 5 oz. wine, 12 oz. beer, or 1.5 oz.  liquor.)  Make sure that ANY health care provider who prescribes medication for you knows that you are taking Coumadin (warfarin).  Also make sure the healthcare provider who is monitoring your Coumadin knows when you have started a new medication including herbals and non-prescription products.  Coumadin (Warfarin)  Major Drug Interactions  Increased Warfarin Effect Decreased Warfarin Effect  Alcohol (large quantities) Antibiotics (esp. Septra/Bactrim, Flagyl, Cipro) Amiodarone (Cordarone) Aspirin (ASA) Cimetidine (Tagamet) Megestrol (Megace) NSAIDs (ibuprofen, naproxen, etc.) Piroxicam (Feldene) Propafenone (Rythmol SR) Propranolol (Inderal) Isoniazid (INH) Posaconazole (Noxafil) Barbiturates (Phenobarbital) Carbamazepine (Tegretol) Chlordiazepoxide (Librium) Cholestyramine (Questran) Griseofulvin Oral Contraceptives Rifampin Sucralfate (Carafate) Vitamin K   Coumadin (Warfarin) Major Herbal Interactions  Increased Warfarin Effect Decreased Warfarin Effect  Garlic Ginseng Ginkgo biloba Coenzyme Q10 Green tea St. Johns wort    Coumadin (Warfarin) FOOD Interactions  Eat a consistent number of servings per week of foods HIGH in Vitamin K (  1 serving =  cup)  Collards (cooked, or boiled & drained) Kale (cooked, or boiled & drained) Mustard greens (cooked, or boiled & drained) Parsley *serving size only =  cup Spinach (cooked, or boiled & drained) Swiss chard (cooked, or boiled & drained) Turnip greens (cooked, or boiled & drained)  Eat a consistent number of servings per week of foods MEDIUM-HIGH in Vitamin K (1 serving = 1 cup)  Asparagus (cooked, or boiled & drained) Broccoli (cooked, boiled & drained, or raw & chopped) Brussel sprouts (cooked, or boiled & drained) *serving size only =  cup Lettuce, raw (green leaf, endive, romaine) Spinach, raw Turnip greens, raw & chopped   These websites have more information on Coumadin (warfarin):   http://www.king-russell.com/; https://www.hines.net/;

## 2013-10-15 ENCOUNTER — Other Ambulatory Visit: Payer: Self-pay | Admitting: Thoracic Surgery (Cardiothoracic Vascular Surgery)

## 2013-10-15 ENCOUNTER — Inpatient Hospital Stay (HOSPITAL_COMMUNITY): Payer: BC Managed Care – PPO

## 2013-10-15 ENCOUNTER — Encounter (HOSPITAL_COMMUNITY): Payer: Self-pay | Admitting: Thoracic Surgery (Cardiothoracic Vascular Surgery)

## 2013-10-15 DIAGNOSIS — I059 Rheumatic mitral valve disease, unspecified: Secondary | ICD-10-CM

## 2013-10-15 LAB — PROTIME-INR
INR: 1.19 (ref 0.00–1.49)
Prothrombin Time: 15.1 seconds (ref 11.6–15.2)

## 2013-10-15 MED ORDER — TRAMADOL HCL 50 MG PO TABS: 50.0000 mg | ORAL_TABLET | ORAL | Status: DC | PRN

## 2013-10-15 MED ORDER — WARFARIN SODIUM 5 MG PO TABS: 5.0000 mg | ORAL_TABLET | Freq: Every day | ORAL | Status: DC

## 2013-10-15 MED ORDER — ASPIRIN 81 MG PO TBEC: 81.0000 mg | DELAYED_RELEASE_TABLET | Freq: Every day | ORAL | Status: DC

## 2013-10-15 MED ORDER — METOPROLOL TARTRATE 25 MG PO TABS: 12.5000 mg | ORAL_TABLET | Freq: Two times a day (BID) | ORAL | Status: DC

## 2013-10-15 MED ORDER — OXYCODONE HCL 5 MG PO TABS: 5.0000 mg | ORAL_TABLET | ORAL | Status: DC | PRN

## 2013-10-15 MED FILL — Cefuroxime Sodium For Inj 750 MG: INTRAMUSCULAR | Qty: 750 | Status: AC

## 2013-10-15 MED FILL — Dexmedetomidine HCl IV Soln 200 MCG/2ML: INTRAVENOUS | Qty: 2 | Status: AC

## 2013-10-15 MED FILL — Heparin Sodium (Porcine) Inj 1000 Unit/ML: INTRAMUSCULAR | Qty: 30 | Status: AC

## 2013-10-15 MED FILL — Potassium Chloride Inj 2 mEq/ML: INTRAVENOUS | Qty: 40 | Status: AC

## 2013-10-15 MED FILL — Magnesium Sulfate Inj 50%: INTRAMUSCULAR | Qty: 10 | Status: AC

## 2013-10-15 NOTE — Progress Notes (Signed)
Ed completed with pt. Voiced understanding. Politely declined CRPII as he will ex on his own. [phone] Ethelda Chick CES, ACSM 9:42 AM 10/15/2013

## 2013-10-15 NOTE — Progress Notes (Signed)
Discharge education, medication, after-care, follow-up appts, and prescriptions given to pt and wife, both stated understanding and that they had no questions, coumadin education provided, incision care education provided, IV and tele removed, pt dressed awaiting wheelchair for discharge [name], RN

## 2013-10-15 NOTE — Progress Notes (Addendum)
      [address]       [place] [address]             [phone]      4 Days Post-Op Procedure(s) (LRB): MINIMALLY INVASIVE MITRAL VALVE REPAIR (MVR) (Right) INTRAOPERATIVE TRANSESOPHAGEAL ECHOCARDIOGRAM (N/A)  Subjective:  Patient is without complaints this morning.  Feels good ready to be discharged.  + ambulation  + BM  Objective: Vital signs in last 24 hours: Temp:  [97.9 F (36.6 C)-99.1 F (37.3 C)] 98.1 F (36.7 C) (07/28 0528) Pulse Rate:  [59-94] 59 (07/28 0528) Cardiac Rhythm:  [-] Normal sinus rhythm (07/27 2033) Resp:  [18] 18 (07/28 0528) BP: (108-126)/(56-66) 109/66 mmHg (07/28 0528) SpO2:  [94 %-100 %] 94 % (07/28 0528) Weight:  [189 lb 13.1 oz (86.1 kg)] 189 lb 13.1 oz (86.1 kg) (07/28 0500)  Intake/Output from previous day: 07/27 0701 - 07/28 0700 In: 360 [P.O.:360] Out: 1 [Stool:1]  General appearance: alert, cooperative and no distress Heart: regular rate and rhythm Lungs: clear to auscultation bilaterally Abdomen: soft, non-tender; bowel sounds normal; no masses,  no organomegaly Extremities: extremities normal, atraumatic, no cyanosis or edema Wound: clean and dry  Lab Results:  Recent Labs  10/13/13 0245  WBC 12.1*  HGB 12.4*  HCT 37.3*  PLT 159   BMET:  Recent Labs  10/13/13 0245  NA 141  K 3.9  CL 105  CO2 24  GLUCOSE 104*  BUN 10  CREATININE 0.80  CALCIUM 8.0*    PT/INR:  Recent Labs  10/15/13 0530  LABPROT 15.1  INR 1.19   ABG    Component Value Date/Time   PHART 7.372 10/12/2013 0403   HCO3 23.1 10/12/2013 0403   TCO2 24 10/12/2013 0403   ACIDBASEDEF 2.0 10/12/2013 0403   O2SAT 97.0 10/12/2013 0403   CBG (last 3)   Recent Labs  10/13/13 0411 10/13/13 0832 10/13/13 1224  GLUCAP 112* 111* 110*    Assessment/Plan: S/P Procedure(s) (LRB): MINIMALLY INVASIVE MITRAL VALVE REPAIR (MVR) (Right) INTRAOPERATIVE TRANSESOPHAGEAL ECHOCARDIOGRAM (N/A)  1. CV- maintaining NSR, continue  Lopressor 2. Pulm- no acute issues, continue IS at discharge 3. INR 1.19, will continue Coumadin at 5 mg daily, plan for PT/INR check Thursday 4. Dispo- patient doing very well, will plan to d/c home today   LOS: 4 days    BARRETT, ERIN 10/15/2013  I have seen and examined the patient and agree with the assessment and plan as outlined.  OWEN,CLARENCE H 10/15/2013 7:49 AM

## 2013-10-15 NOTE — Care Management Note (Signed)
    Page 1 of 1   10/15/2013     4:10:36 PM CARE MANAGEMENT NOTE 10/15/2013  Patient:  TAUREAN, BABICZ   Account Number:  [id number]  Date Initiated:  10/15/2013  Documentation initiated by:  Naila Elizondo  Subjective/Objective Assessment:   Pt adm s/p MVR on 10/11/13.  PTA, pt independent, lives with spouse.     Action/Plan:   Wife to provide care at dc.  No home needs identified.   Anticipated DC Date:  10/15/2013   Anticipated DC Plan:  HOME/SELF CARE      DC Planning Services  CM consult      Choice offered to / List presented to:             Status of service:  Completed, signed off Medicare Important Message given?   (If response is "NO", the following Medicare IM given date fields will be blank) Date Medicare IM given:   Medicare IM given by:   Date Additional Medicare IM given:   Additional Medicare IM given by:    Discharge Disposition:  HOME/SELF CARE  Per UR Regulation:  Reviewed for med. necessity/level of care/duration of stay  If discussed at Long Length of Stay Meetings, dates discussed:    Comments:

## 2013-10-15 NOTE — Progress Notes (Signed)
Chest tube sutures removed per order and protocol, steri strips applied, pt educated [name], RN

## 2013-10-17 ENCOUNTER — Ambulatory Visit (INDEPENDENT_AMBULATORY_CARE_PROVIDER_SITE_OTHER): Payer: BC Managed Care – PPO | Admitting: Pharmacist

## 2013-10-17 ENCOUNTER — Ambulatory Visit: Payer: BC Managed Care – PPO | Admitting: Cardiovascular Disease

## 2013-10-17 DIAGNOSIS — Z9889 Other specified postprocedural states: Secondary | ICD-10-CM

## 2013-10-17 LAB — POCT INR: INR: 2.3

## 2013-10-17 NOTE — Anesthesia Postprocedure Evaluation (Signed)
  Anesthesia Post-op Note  Patient: Lawrence Underwood  Procedure(s) Performed: Procedure(s): MINIMALLY INVASIVE MITRAL VALVE REPAIR (MVR) (Right) INTRAOPERATIVE TRANSESOPHAGEAL ECHOCARDIOGRAM (N/A)  Patient Location: ICU  Anesthesia Type:General  Level of Consciousness: sedated  Airway and Oxygen Therapy: Patient remains intubated per anesthesia plan  Post-op Pain: none  Post-op Assessment: Post-op Vital signs reviewed, Patient's Cardiovascular Status Stable and Respiratory Function Stable  Post-op Vital Signs: Reviewed and stable  Last Vitals:  Filed Vitals:   10/15/13 0828  BP: 130/63  Pulse: 63  Temp:   Resp:     Complications: No apparent anesthesia complications

## 2013-10-18 ENCOUNTER — Telehealth: Payer: Self-pay | Admitting: Cardiology

## 2013-10-18 NOTE — Telephone Encounter (Signed)
Pt called with complaints of gout, Rt great toe.  Red and swollen.  He has had before.  He has indocin but he is on coumadin so I instructed not to take.  I called in colchicine 0.6 mg one every hour up to 3 tabs tonight or until diarrhea then 1 twice a day for 5 days then one daily.  Usually he takes tart cherry juice and it resolves but it did not this time.

## 2013-10-21 ENCOUNTER — Ambulatory Visit (INDEPENDENT_AMBULATORY_CARE_PROVIDER_SITE_OTHER): Payer: BC Managed Care – PPO

## 2013-10-21 ENCOUNTER — Ambulatory Visit (INDEPENDENT_AMBULATORY_CARE_PROVIDER_SITE_OTHER): Payer: Self-pay | Admitting: Thoracic Surgery (Cardiothoracic Vascular Surgery)

## 2013-10-21 ENCOUNTER — Encounter: Payer: Self-pay | Admitting: Thoracic Surgery (Cardiothoracic Vascular Surgery)

## 2013-10-21 ENCOUNTER — Ambulatory Visit
Admission: RE | Admit: 2013-10-21 | Discharge: 2013-10-21 | Disposition: A | Discharge status: Home / Self Care | Payer: BC Managed Care – PPO | Source: Ambulatory Visit | Attending: Thoracic Surgery (Cardiothoracic Vascular Surgery) | Admitting: Thoracic Surgery (Cardiothoracic Vascular Surgery)

## 2013-10-21 VITALS — BP 140/86 | HR 80 | Resp 20 | Ht 70.0 in | Wt 189.0 lb

## 2013-10-21 DIAGNOSIS — R011 Cardiac murmur, unspecified: Secondary | ICD-10-CM

## 2013-10-21 DIAGNOSIS — I059 Rheumatic mitral valve disease, unspecified: Secondary | ICD-10-CM

## 2013-10-21 DIAGNOSIS — Z9889 Other specified postprocedural states: Secondary | ICD-10-CM

## 2013-10-21 DIAGNOSIS — I341 Nonrheumatic mitral (valve) prolapse: Secondary | ICD-10-CM

## 2013-10-21 DIAGNOSIS — I340 Nonrheumatic mitral (valve) insufficiency: Secondary | ICD-10-CM

## 2013-10-21 LAB — POCT INR: INR: 1.6

## 2013-10-21 NOTE — Patient Instructions (Addendum)
The patient may continue to gradually increase their physical activity as tolerated.  They should refrain from any heavy lifting or strenuous use of their arms and shoulders until at least 8 weeks from the time of their surgery, and they should avoid activities that cause increased pain in their chest on the side of their surgical incision.  Otherwise they may continue to increase their activities without any particular limitations.  The patient may return to driving an automobile as long as they are no longer requiring oral narcotic pain relievers during the daytime.  It would be wise to start driving only short distances during the daylight and gradually increase from there as they feel comfortable.  Stop taking aspirin

## 2013-10-21 NOTE — Progress Notes (Signed)
[address]       [place] [id number]             [phone]     CARDIOTHORACIC SURGERY OFFICE NOTE  Referring Provider is Tonny Bollmanooper, Michael, MD PCP is Kerby NoraAmy Bedsole, MD   HPI:  Patient returns for routine followup status post minimally invasive mitral valve repair on 10/11/2013. His postoperative recovery in the hospital was uncomplicated and he was discharged home on the fourth postoperative day. Since then he has continued to do well, although several days ago he developed an acute flare of gout in the MTP joint of his right foot. He is now taking colchicine and symptoms are improving. Otherwise he has done exceptionally well. He reports only mild residual soreness in his chest. He has not taken any pain relievers other than Tylenol since hospital discharge, and he states that he doesn't even use Tylenol presently as he said really minimal discomfort. His appetite is good. He was sleeping fine until he developed gout flare. He has no shortness of breath. He has been walking a fair amount until he developed his gout flare. Overall he feels quite well and is delighted with his progress. He hopes to go back to work next week.  His prothrombin time and Coumadin dose has been monitored through the Coumadin clinic. His last INR was 2.3.   Current Outpatient Prescriptions  Medication Sig Dispense Refill  . colchicine 0.6 MG tablet Take 0.6 mg by mouth 2 (two) times daily.      . metoprolol tartrate (LOPRESSOR) 25 MG tablet Take 0.5 tablets (12.5 mg total) by mouth 2 (two) times daily.  30 tablet  3  . oxyCODONE (OXY IR/ROXICODONE) 5 MG immediate release tablet Take 1-2 tablets (5-10 mg total) by mouth every 4 (four) hours as needed for moderate pain.  30 tablet  0  . traMADol (ULTRAM) 50 MG tablet Take 1-2 tablets (50-100 mg total) by mouth every 4 (four) hours as needed for moderate pain.  30 tablet  0  . warfarin (COUMADIN) 5 MG tablet Take 1 tablet (5 mg total) by mouth  daily at 6 PM.  30 tablet  3   No current facility-administered medications for this visit.      Physical Exam:   BP 140/86  Pulse 80  Resp 20  Ht 5\' 10"  (1.778 m)  Wt 189 lb (85.73 kg)  BMI 27.12 kg/m2  SpO2 97%  General:  Well-appearing  Chest:   Clear to auscultation  CV:   Regular rate and rhythm without murmur  Incisions:  Healing nicely  Abdomen:  Soft and nontender  Extremities:  Warm and well-perfused  Diagnostic Tests:  CHEST 2 VIEW  COMPARISON: 10/15/2013 and 10/13/2013 radiographs.  FINDINGS:  The heart size and mediastinal contours are stable following  minimally invasive mitral valve repair. There is improved aeration  of the lung bases. There is some residual atelectasis and pleural  thickening on the right. There is no significant pleural effusion or  pneumothorax. The left lung is clear. The osseous structures appear  unchanged.  IMPRESSION:  Improving postoperative aeration of the right lung base.  Electronically Signed  By: Roxy HorsemanBill Veazey M.D.  On: 10/21/2013 11:45     Impression:  Patient is doing very well just 10 days status post minimally invasive mitral valve repair.  Plan:  I've encouraged patient to continue to gradually increase his physical activity over the next few weeks. He may resume driving an  automobile.  I think he could go back to work part-time beginning next week and gradually increase from there. We have discussed his physical limitations and all of his questions been addressed. I've instructed him to stop taking aspirin and that he is therapeutic on Coumadin. He understands that Coumadin is short-term and could likely be discontinued after 3 months.  The patient will return for routine followup in 4 weeks.    Salvatore Decent. Cornelius Moras, MD 10/21/2013 12:54 PM

## 2013-10-28 ENCOUNTER — Encounter: Payer: BC Managed Care – PPO | Admitting: Physician Assistant

## 2013-10-30 ENCOUNTER — Ambulatory Visit (INDEPENDENT_AMBULATORY_CARE_PROVIDER_SITE_OTHER): Payer: BC Managed Care – PPO | Admitting: Pharmacist

## 2013-10-30 DIAGNOSIS — Z9889 Other specified postprocedural states: Secondary | ICD-10-CM

## 2013-10-30 LAB — POCT INR: INR: 1.7

## 2013-11-04 ENCOUNTER — Ambulatory Visit: Payer: BC Managed Care – PPO | Admitting: Thoracic Surgery (Cardiothoracic Vascular Surgery)

## 2013-11-05 ENCOUNTER — Ambulatory Visit (INDEPENDENT_AMBULATORY_CARE_PROVIDER_SITE_OTHER): Payer: BC Managed Care – PPO | Admitting: Nurse Practitioner

## 2013-11-05 ENCOUNTER — Ambulatory Visit (INDEPENDENT_AMBULATORY_CARE_PROVIDER_SITE_OTHER): Payer: BC Managed Care – PPO | Admitting: Pharmacist Clinician (PhC)/ Clinical Pharmacy Specialist

## 2013-11-05 ENCOUNTER — Telehealth: Payer: Self-pay | Admitting: Nurse Practitioner

## 2013-11-05 ENCOUNTER — Encounter: Payer: Self-pay | Admitting: Nurse Practitioner

## 2013-11-05 VITALS — BP 110/80 | HR 86 | Ht 70.0 in | Wt 190.0 lb

## 2013-11-05 DIAGNOSIS — E785 Hyperlipidemia, unspecified: Secondary | ICD-10-CM

## 2013-11-05 DIAGNOSIS — Z9889 Other specified postprocedural states: Secondary | ICD-10-CM

## 2013-11-05 LAB — CBC
HCT: 41.1 % (ref 39.0–52.0)
Hemoglobin: 13.8 g/dL (ref 13.0–17.0)
MCHC: 33.5 g/dL (ref 30.0–36.0)
MCV: 81.1 fl (ref 78.0–100.0)
Platelets: 380 10*3/uL (ref 150.0–400.0)
RBC: 5.07 Mil/uL (ref 4.22–5.81)
RDW: 12.6 % (ref 11.5–15.5)
WBC: 8.2 10*3/uL (ref 4.0–10.5)

## 2013-11-05 LAB — BASIC METABOLIC PANEL
BUN: 14 mg/dL (ref 6–23)
CO2: 29 mEq/L (ref 19–32)
Calcium: 9.3 mg/dL (ref 8.4–10.5)
Chloride: 103 mEq/L (ref 96–112)
Creatinine, Ser: 1 mg/dL (ref 0.4–1.5)
GFR: 82.69 mL/min (ref >60.00)
Glucose, Bld: 84 mg/dL (ref 70–99)
Potassium: 4.1 mEq/L (ref 3.5–5.1)
Sodium: 139 mEq/L (ref 135–145)

## 2013-11-05 LAB — POCT INR: INR: 2.7

## 2013-11-05 MED ORDER — COLCHICINE 0.6 MG PO TABS: 0.6000 mg | ORAL_TABLET | Freq: Two times a day (BID) | ORAL | Status: DC

## 2013-11-05 NOTE — Telephone Encounter (Signed)
New problem ° ° °Pt returning a call from nurse. Please call pt. °

## 2013-11-05 NOTE — Progress Notes (Signed)
Lawrence Underwood Date of Birth: 01/07/1966 Medical Record #[id number]  History of Present Illness: Mr. Levester FreshGeletko is seen back today for a 2 month check. Seen for Dr. Excell Seltzerooper. He has gout and HLD.  Most recently found to have asymptomatic, severe MR. Has had minimally invasive MVR per Dr. Cornelius Moraswen. His course was uncomplicated. He is to be on coumadin for 3 months. 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   Comes back today. Here alone. Feels like he is doing well. No chest pain. Not short of breath. Minimal soreness of his chest. Has noted some heaviness of both arms - has happened 5 to 8 times and will last for 5 to 10 minutes. Had an acute spell of vertigo last Friday - felt disoriented and the room was spinning. Lasted 5 minutes and resolved. Has not recurred. INR not therapeutic. Back to work. Has not started his Lipitor. Has had some gout flare up and took colchicine - feels like it may be coming back - right great toe.  Current Outpatient Prescriptions  Medication Sig Dispense Refill  . metoprolol tartrate (LOPRESSOR) 25 MG tablet Take 0.5 tablets (12.5 mg total) by mouth 2 (two) times daily.  30 tablet  3  . warfarin (COUMADIN) 5 MG tablet Take 1 tablet (5 mg total) by mouth daily at 6 PM.  30 tablet  3  . atorvastatin (LIPITOR) 10 MG tablet Take 10 mg by mouth daily.      . colchicine 0.6 MG tablet Take 1 tablet (0.6 mg total) by mouth 2 (two) times daily. Gout flare up  30 tablet  3   No current facility-administered medications for this visit.    No Known Allergies  Past Medical History  Diagnosis Date  . Gout     but doesn't take any meds for this  . Mitral regurgitation due to cusp prolapse 08/26/2013  . Coronary artery disease 09/18/2013    40% stenosis RCA and otherwise non-obstructive CAD   . History of bronchitis 2 yrs ago  . Dizziness     r/t mitral valve  . Hyperlipidemia   . H/O hiatal hernia   . GERD (gastroesophageal reflux disease)     no meds  . S/P  minimally invasive mitral valve repair 10/11/2013    Complex valvuloplasty including triangular resection of flail posterior leaflet, artificial Gore-tex neocord placement x4, Sorin Memo 3D ring annuloplasty (size 34 mm) via right mini thoracotomy approach    Past Surgical History  Procedure Laterality Date  . Hand surgery      R hand  . Knee surgery Left     arthroscopy  . Tee without cardioversion N/A 08/26/2013    Procedure: TRANSESOPHAGEAL ECHOCARDIOGRAM (TEE);  Surgeon: Lars MassonKatarina H Nelson, MD;  [facility];  Service: Cardiovascular;  Laterality: N/A;  . Wisdom tooth extraction    . Cardiac catheterization  09/18/13  . Mitral valve repair Right 10/11/2013    Procedure: MINIMALLY INVASIVE MITRAL VALVE REPAIR (MVR);  Surgeon: Purcell Nailslarence H Owen, MD;  [facility];  Service: Open Heart Surgery;  Laterality: Right;  . Intraoperative transesophageal echocardiogram N/A 10/11/2013    Procedure: INTRAOPERATIVE TRANSESOPHAGEAL ECHOCARDIOGRAM;  Surgeon: Purcell Nailslarence H Owen, MD;  [facility];  Service: Open Heart Surgery;  Laterality: N/A;    History  Smoking status  . Never Smoker   Smokeless tobacco  . Never Used    History  Alcohol Use  . Yes    Comment: weekends    No  family history on file.  Review of Systems: The review of systems is per the HPI.  All other systems were reviewed and are negative.  Physical Exam: BP 110/80  Pulse 86  Ht 5\' 10"  (1.778 m)  Wt 190 lb (86.183 kg)  BMI 27.26 kg/m2 Patient is very pleasant and in no acute distress. Skin is warm and dry. Color is normal.  HEENT is unremarkable. Normocephalic/atraumatic. PERRL. Sclera are nonicteric. Neck is supple. No masses. No JVD. Lungs are clear. Cardiac exam shows a regular rate and rhythm. No murmur. Incisions look great. Abdomen is soft. Extremities are without edema. Gait and ROM are intact. No gross neurologic deficits noted.  Wt Readings from Last 3 Encounters:  11/05/13 190 lb (86.183 kg)    10/21/13 189 lb (85.73 kg)  10/15/13 189 lb 13.1 oz (86.1 kg)    LABORATORY DATA/PROCEDURES: EKg with NSR.   Labs pending.  Lab Results  Component Value Date   WBC 12.1* 10/13/2013   HGB 12.4* 10/13/2013   HCT 37.3* 10/13/2013   PLT 159 10/13/2013   GLUCOSE 104* 10/13/2013   CHOL 261 02/23/2007   TRIG 280 02/23/2007   HDL 38 02/23/2007   LDLCALC 167 02/23/2007   ALT 18 10/09/2013   AST 23 10/09/2013   NA 141 10/13/2013   K 3.9 10/13/2013   CL 105 10/13/2013   CREATININE 0.80 10/13/2013   BUN 10 10/13/2013   CO2 24 10/13/2013   INR 1.7 10/30/2013   HGBA1C 6.0* 10/09/2013    BNP (last 3 results) No results found for this basename: PROBNP,  in the last 8760 hours  Lab Results  Component Value Date   INR 1.7 10/30/2013   INR 1.6 10/21/2013   INR 2.3 10/17/2013   CARDIAC CATH Final Conclusions:  1. Nonobstructive coronary artery disease, primarily affecting the mid right coronary artery with a mild to moderate lesion in segment. Otherwise the patient has angiographically normal coronary arteries.  2. Normal LV systolic function with severe mitral regurgitation  3. Normal resting intracardiac hemodynamics  Tonny Bollman  09/18/2013, 4:02 PM    Assessment / Plan: 1. S/P minimally invasive MVR -  Doing well. Not sure what to make of this acute vertigo and his arms. He was subsequently seen by Dr. Excell Seltzer as well. Will add baby aspirin. Check echo and baseline labs today. He is to call if he has recurrence.   2. HLD -  To start statin therapy  3. Gout -  Recent flare - improved but feels like he may be having a relapse - colchicine refilled. To start his statin a few days after finishing.   4. CAD - nonobstructive - manage medically.  Needs follow up labs. Arrange for echo. See Dr. Excell Seltzer in 2 months.   Patient is agreeable to this plan and will call if any problems develop in the interim.   Rosalio Macadamia, RN, ANP-C [facility] [address] [place], [place]  [address] [phone]

## 2013-11-05 NOTE — Patient Instructions (Addendum)
We will check follow up labs today  Stay on your current medicines - I have refilled the colchicine today  See Dr. Excell Seltzer in 2 months  We will arrange for an echocardiogram  Add back baby aspirin 81 mg each day  Ok to start the Lipitor after taking your colchicine- will recheck fasting labs on return visit with Dr. Excell Seltzer  Call the [facility] office at [phone] if you have any questions, problems or concerns.

## 2013-11-06 ENCOUNTER — Telehealth: Payer: Self-pay | Admitting: Nurse Practitioner

## 2013-11-06 NOTE — Telephone Encounter (Signed)
New message ° ° ° ° °Pt returning Kim's call ° °

## 2013-11-06 NOTE — Telephone Encounter (Signed)
Spoke with patient he is aware of results.

## 2013-11-08 ENCOUNTER — Ambulatory Visit (HOSPITAL_COMMUNITY): Payer: BC Managed Care – PPO | Attending: Cardiovascular Disease | Admitting: Radiology

## 2013-11-08 DIAGNOSIS — I059 Rheumatic mitral valve disease, unspecified: Secondary | ICD-10-CM | POA: Diagnosis not present

## 2013-11-08 DIAGNOSIS — Z9889 Other specified postprocedural states: Secondary | ICD-10-CM

## 2013-11-08 DIAGNOSIS — I519 Heart disease, unspecified: Secondary | ICD-10-CM | POA: Diagnosis not present

## 2013-11-08 NOTE — Progress Notes (Signed)
Echocardiogram performed.  

## 2013-11-15 ENCOUNTER — Encounter (HOSPITAL_COMMUNITY): Payer: Self-pay | Admitting: Cardiovascular Disease

## 2013-11-15 ENCOUNTER — Ambulatory Visit: Payer: BC Managed Care – PPO | Admitting: Family Medicine

## 2013-11-15 ENCOUNTER — Other Ambulatory Visit: Payer: Self-pay | Admitting: *Deleted

## 2013-11-15 MED ORDER — LISINOPRIL 2.5 MG PO TABS: 2.5000 mg | ORAL_TABLET | Freq: Every day | ORAL | Status: DC

## 2013-11-15 NOTE — Telephone Encounter (Signed)
Patient is returning your call. He would like a call back today.

## 2013-11-18 ENCOUNTER — Ambulatory Visit (INDEPENDENT_AMBULATORY_CARE_PROVIDER_SITE_OTHER): Payer: Self-pay | Admitting: Thoracic Surgery (Cardiothoracic Vascular Surgery)

## 2013-11-18 ENCOUNTER — Ambulatory Visit (INDEPENDENT_AMBULATORY_CARE_PROVIDER_SITE_OTHER): Payer: BC Managed Care – PPO | Admitting: *Deleted

## 2013-11-18 ENCOUNTER — Encounter: Payer: Self-pay | Admitting: Thoracic Surgery (Cardiothoracic Vascular Surgery)

## 2013-11-18 VITALS — BP 124/74 | HR 90 | Resp 20 | Ht 70.0 in | Wt 190.0 lb

## 2013-11-18 DIAGNOSIS — Z9889 Other specified postprocedural states: Secondary | ICD-10-CM

## 2013-11-18 DIAGNOSIS — I059 Rheumatic mitral valve disease, unspecified: Secondary | ICD-10-CM

## 2013-11-18 DIAGNOSIS — I340 Nonrheumatic mitral (valve) insufficiency: Secondary | ICD-10-CM

## 2013-11-18 LAB — POCT INR: INR: 2.5

## 2013-11-18 NOTE — Patient Instructions (Signed)
The patient may return to work and resume normal physical activity without any particular limitations at this time.  Call and return sooner should you develop increasing symptoms of dizzy spells or transient numbness or weakness

## 2013-11-18 NOTE — Progress Notes (Signed)
[address]       [name] [address]             [phone]     CARDIOTHORACIC SURGERY OFFICE NOTE  Referring Provider is Tonny Bollman, MD PCP is [name], MD   HPI:  Patient returns for routine followup status post minimally invasive mitral valve repair on 10/11/2013. His postoperative recovery in the hospital was uncomplicated and he was discharged home on the fourth postoperative day. Since then he has continued to do well.  He did develop an acute flare of gout which resolved with colchicine therapy. He has been followed carefully by Dr. Excell Seltzer and Norma Fredrickson. He underwent a followup echocardiogram last week that revealed intact mitral valve repair with no residual mitral regurgitation. There was a drop in left ventricular ejection fraction to 40-45% which has prompted the addition of an ACE inhibitor for medical therapy. The patient states that he feels remarkably well particularly given the fact that it is less than 5 weeks since his surgery. He has no significant residual soreness in his chest. His exercise tolerance is quite good and he does not have any exertional shortness of breath. He has experienced several brief episodes of transient numbness or "heaviness" in his arms. He states that this has occurred on several occasions and has not been related to exertion. He can be either the right arm or left arm but it does not occur both arms simultaneously. He has not had any transient visual disturbances. He had one transient dizzy spell that occurred fairly early after hospital discharge, but none since. He otherwise feels remarkably good.  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    Current Outpatient Prescriptions  Medication Sig Dispense Refill  . atorvastatin (LIPITOR) 10 MG tablet Take 10 mg by mouth daily.      . colchicine 0.6 MG tablet Take 1 tablet (0.6 mg total) by mouth 2 (two) times daily. Gout  flare up  30 tablet  3  . lisinopril (PRINIVIL,ZESTRIL) 2.5 MG tablet Take 1 tablet (2.5 mg total) by mouth daily.  30 tablet  3  . metoprolol tartrate (LOPRESSOR) 25 MG tablet Take 0.5 tablets (12.5 mg total) by mouth 2 (two) times daily.  30 tablet  3  . warfarin (COUMADIN) 5 MG tablet Take 1 tablet (5 mg total) by mouth daily at 6 PM.  30 tablet  3   No current facility-administered medications for this visit.      Physical Exam:   BP 124/74  Pulse 90  Resp 20  Ht  (1.778 m)  Wt 190 lb (86.183 kg)  BMI 27.26 kg/m2  SpO2 98%  General:  Well-appearing  Chest:   Clear to auscultation  CV:   Regular rate and rhythm without murmur  Incisions:  Healing nicely  Abdomen:  Soft and nontender  Extremities:  Warm and well-perfused  Diagnostic Tests:  Transthoracic Echocardiography  Patient: Crit, Obremski MR #: [id number] Study Date: 11/08/2013 Gender: M Age: 48 Height: 177.8 cm Weight: 85.7 kg BSA: 2.07 m^2 Pt. Status: Room:  ATTENDING Default, Provider [phone] REFERRING Tonny Bollman SONOGRAPHER Aida Raider, RDCS Janalee Dane PERFORMING [facility], Outpatient  cc:  ------------------------------------------------------------------- LV EF: 40% - 45%  ------------------------------------------------------------------- Indications: 424.0 Mitral valve disease.  ------------------------------------------------------------------- History: PMH: Acquired from the patient and from the patient&'s chart. PMH: CAD. Murmur. History of Mitral Valve Cusp Prolapse. Risk  factors: Dyslipidemia.  ------------------------------------------------------------------- Study Conclusions  - Left ventricle: The cavity size was normal. Wall thickness was increased in a pattern of mild LVH. Systolic function was mildly to moderately reduced. The estimated ejection fraction was in the range of 40% to 45%. Diffuse hypokinesis. Doppler parameters are consistent with high  ventricular filling pressure. - Mitral valve: Prior procedures included surgical repair. The findings are consistent with mild stenosis.  Impressions:  - Mild to moderate LV dysfunction (EF 40-45); s/p MV repair; mild MS by mean gradient; no MR. Compared to 08/19/13, LV function is worse and patient is now s/p mitral valve repair.  ------------------------------------------------------------------- Labs, prior tests, procedures, and surgery: Status post Mitral Valve Repair-Valvuloplasty( Sorin Memo 3D Ring Annuloplasty 63mm via Right Mini Thoracotomy) Transthoracic echocardiography. M-mode, complete 2D, spectral Doppler, and color Doppler. Birthdate: Patient birthdate: 1965/10/15. Age: Patient is 48 yr old. Sex: Gender: male. BMI: 27.1 kg/m^2. Blood pressure: 110/80 Patient status: Outpatient. Study date: Study date: 11/08/2013. Study time: 04:20 PM. Location: [facility]  -------------------------------------------------------------------  ------------------------------------------------------------------- Left ventricle: The cavity size was normal. Wall thickness was increased in a pattern of mild LVH. Systolic function was mildly to moderately reduced. The estimated ejection fraction was in the range of 40% to 45%. Diffuse hypokinesis. Doppler parameters are consistent with high ventricular filling pressure.  ------------------------------------------------------------------- Aortic valve: Trileaflet; normal thickness leaflets. Mobility was not restricted. Doppler: Transvalvular velocity was within the normal range. There was no stenosis. There was no regurgitation.  ------------------------------------------------------------------- Aorta: Aortic root: The aortic root was normal in size.  ------------------------------------------------------------------- Mitral valve: Prior procedures included surgical repair. Mobility was not restricted. Doppler: The findings are  consistent with mild stenosis. There was no regurgitation. Valve area by pressure half-time: 2.86 cm^2. Indexed valve area by pressure half-time: 1.38 cm^2/m^2. Valve area by continuity equation (using LVOT flow): 1.8 cm^2. Indexed valve area by continuity equation (using LVOT flow): 0.87 cm^2/m^2. Mean gradient (D): 5 mm Hg. Peak gradient (D): 5 mm Hg.  ------------------------------------------------------------------- Left atrium: The atrium was normal in size.  ------------------------------------------------------------------- Right ventricle: The cavity size was normal. Systolic function was normal.  ------------------------------------------------------------------- Pulmonic valve: Doppler: Transvalvular velocity was within the normal range. There was no evidence for stenosis. There was trivial regurgitation.  ------------------------------------------------------------------- Tricuspid valve: Structurally normal valve. Doppler: Transvalvular velocity was within the normal range. There was trivial regurgitation.  ------------------------------------------------------------------- Right atrium: The atrium was normal in size. 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  ------------------------------------------------------------------- Pericardium: There was no pericardial effusion.  ------------------------------------------------------------------- Systemic veins: Inferior vena cava: The vessel was normal in size.  ------------------------------------------------------------------- Measurements  Left ventricle Value Reference LV ID, ED, PLAX chordal 47.51 mm 43 - 52 LV ID, ES, PLAX chordal 29.8 mm 23 - 38 LV fx shortening, PLAX chordal 37 % >=29 LV PW thickness, ED 12.48 mm --------- IVS/LV PW ratio, ED 0.96 <=1.3 Stroke volume, 2D 62 ml --------- Stroke volume/bsa, 2D 30 ml/m^2 --------- LV e&', lateral 7.35 cm/s --------- LV E/e&', lateral 15.37  --------- LV e&', medial 7.46 cm/s --------- LV E/e&', medial 15.15 --------- LV e&', average 7.41 cm/s --------- LV E/e&', average 15.26 ---------  Ventricular septum Value Reference IVS thickness, ED 12 mm ---------  LVOT Value Reference LVOT ID, S 22 mm --------- LVOT area 3.8 cm^2 --------- LVOT peak velocity, S 99.4 cm/s --------- LVOT mean velocity, S 76.1 cm/s --------- LVOT VTI, S 16.3 cm ---------  Aorta Value Reference Aortic root ID, ED 34 mm ---------  Left atrium Value  Reference LA ID, A-P, ES 35 mm --------- LA ID/bsa, A-P 1.69 cm/m^2 <=2.2  Mitral valve Value Reference Mitral E-wave peak velocity 113 cm/s --------- Mitral A-wave peak velocity 131 cm/s --------- Mitral mean velocity, D 107 cm/s --------- Mitral deceleration time (H) 303 ms 150 - 230 Mitral pressure half-time 78 ms --------- Mitral mean gradient, D 5 mm Hg --------- Mitral peak gradient, D 5 mm Hg --------- Mitral E/A ratio, peak 0.9 --------- Mitral valve area, PHT, DP 2.86 cm^2 --------- Mitral valve area/bsa, PHT, DP 1.38 cm^2/m^2 --------- Mitral valve area, LVOT 1.8 cm^2 --------- continuity Mitral valve area/bsa, LVOT 0.87 cm^2/m^2 --------- continuity Mitral annulus VTI, D 33.4 cm ---------  Right ventricle Value Reference RV s&', lateral, S 11.5 cm/s ---------  Legend: (L) and (H) mark values outside specified reference range.  ------------------------------------------------------------------- Prepared and Electronically Authenticated by  Olga Millers 2015-08-21T17:37:57    Impression:  The patient appears to be doing very well less than 5 weeks status post minimally invasive mitral valve repair. Late followup echocardiogram looks quite good with intact repair. There has been a slight drop in left ventricular ejection fraction, which is not unusual under the circumstances. Clinically the patient is doing remarkably well although he does report some occasional atypical  symptoms of transient numbness or weakness in either upper extremity. I am not sure what to make of these reported symptoms. They do not sound like TIAs and the patient is currently therapeutic on Coumadin.   Plan:  I've encouraged the patient to continue to gradually increase his physical activity without any particular limitations. I have suggested that he should continue to monitor for any potential transient neurologic symptoms, and call and return for further evaluation should the symptoms persist or progress. Otherwise he will plan to return for routine followup in 2 months. All of his questions been addressed.   Salvatore Decent. Cornelius Moras, MD 11/18/2013 3:08 PM

## 2013-11-21 ENCOUNTER — Encounter: Payer: Self-pay | Admitting: Cardiovascular Disease

## 2013-11-21 ENCOUNTER — Encounter (HOSPITAL_COMMUNITY): Payer: Self-pay | Admitting: Cardiovascular Disease

## 2013-11-21 NOTE — Telephone Encounter (Signed)
°  Patient forget to take medication this morning, he would like to know what to do? Please call and advise.

## 2013-11-21 NOTE — Telephone Encounter (Signed)
This encounter was created in error - please disregard.

## 2013-12-02 ENCOUNTER — Telehealth: Payer: Self-pay

## 2013-12-02 ENCOUNTER — Ambulatory Visit (INDEPENDENT_AMBULATORY_CARE_PROVIDER_SITE_OTHER): Payer: BC Managed Care – PPO

## 2013-12-02 ENCOUNTER — Encounter: Payer: Self-pay | Admitting: *Deleted

## 2013-12-02 ENCOUNTER — Telehealth: Payer: Self-pay | Admitting: Family Medicine

## 2013-12-02 DIAGNOSIS — Z9889 Other specified postprocedural states: Secondary | ICD-10-CM

## 2013-12-02 LAB — POCT INR: INR: 2.7

## 2013-12-02 MED ORDER — COLCHICINE 0.6 MG PO TABS: ORAL_TABLET | ORAL | Status: DC

## 2013-12-02 NOTE — Telephone Encounter (Signed)
Pt reports onset of gout flare in R great toe.  Third occurrence since surgery, has had hx of episodic gout flares in the past.  Pt has most recently been treated with Colchicine end of July, off x 2 weeks, then flared again took another course of Colchicine off x approx 2-3 weeks and is flaring again.  Pt is flying out to [place] this am on business.  Requesting rx of Colchicine be called to pharmacy in [place] please call and advise.  He will call with pharmacy name once arrives in [facility].

## 2013-12-02 NOTE — Telephone Encounter (Signed)
Noted  

## 2013-12-02 NOTE — Telephone Encounter (Signed)
Left message for patient to return my call.

## 2013-12-02 NOTE — Telephone Encounter (Signed)
[facility] number  [facility] [place]   [phone]

## 2013-12-02 NOTE — Addendum Note (Signed)
Addended by: Damita Lack on: 12/02/2013 02:28 PM   Modules accepted: Orders

## 2013-12-02 NOTE — Telephone Encounter (Signed)
Will send rx for colchicine. When he can come in we should check a uric acid. If this is elevated (as it likely is) we will need to start a gout prophylactic medicine once he is over this flare. For now he can start colchicine... continue until flare free, and he then needs to call me to let me know he is flare free before stopping colchicine.

## 2013-12-02 NOTE — Telephone Encounter (Signed)
Mr. Campus notified as instructed by telephone. MyChart message sent per patient's request.  He will call me with pharmacy once he arrives in [place] and I will then send in Rx for Colchicine.  [facility] [place] [facility] the Rx for colchicine that Dr. Ermalene Searing sent in earlier.

## 2013-12-12 ENCOUNTER — Encounter (HOSPITAL_COMMUNITY): Payer: Self-pay | Admitting: Cardiovascular Disease

## 2013-12-23 ENCOUNTER — Ambulatory Visit (INDEPENDENT_AMBULATORY_CARE_PROVIDER_SITE_OTHER): Payer: BC Managed Care – PPO | Admitting: *Deleted

## 2013-12-23 DIAGNOSIS — Z9889 Other specified postprocedural states: Secondary | ICD-10-CM

## 2013-12-23 LAB — POCT INR: INR: 2.5

## 2013-12-24 ENCOUNTER — Other Ambulatory Visit: Payer: Self-pay

## 2013-12-24 DIAGNOSIS — E785 Hyperlipidemia, unspecified: Secondary | ICD-10-CM

## 2014-01-01 ENCOUNTER — Telehealth: Payer: Self-pay | Admitting: Cardiovascular Disease

## 2014-01-01 ENCOUNTER — Other Ambulatory Visit: Payer: Self-pay

## 2014-01-01 DIAGNOSIS — Z9889 Other specified postprocedural states: Secondary | ICD-10-CM

## 2014-01-01 DIAGNOSIS — I341 Nonrheumatic mitral (valve) prolapse: Secondary | ICD-10-CM

## 2014-01-01 DIAGNOSIS — I340 Nonrheumatic mitral (valve) insufficiency: Secondary | ICD-10-CM

## 2014-01-01 NOTE — Telephone Encounter (Signed)
Walk in pt Form " Letter Dropped Off" gave to Lauren   10.14.15/km

## 2014-01-02 ENCOUNTER — Telehealth: Payer: Self-pay | Admitting: Cardiovascular Disease

## 2014-01-02 NOTE — Telephone Encounter (Signed)
New message    Patient calling did not take last medication - please advise on next what should he do

## 2014-01-02 NOTE — Telephone Encounter (Signed)
Pt st that he forgot to take his medications (Lipitor, Coumadin, and metoprolol) last night. Patient st that he took his lipitor and metoprolol this morning and will take his coumadin at 6. Instructed patient not to change anything and to try to be as compliant as possible (especially with his coumadin).  Confirmed with patient appointment on 10/26.   Talked with Tiffany, RN in [facility] who called patient and instructed him to take an extra half dose of coumadin tonight.

## 2014-01-03 ENCOUNTER — Other Ambulatory Visit: Payer: Self-pay

## 2014-01-10 ENCOUNTER — Other Ambulatory Visit: Payer: BC Managed Care – PPO

## 2014-01-13 ENCOUNTER — Ambulatory Visit (INDEPENDENT_AMBULATORY_CARE_PROVIDER_SITE_OTHER): Payer: BC Managed Care – PPO

## 2014-01-13 ENCOUNTER — Ambulatory Visit (INDEPENDENT_AMBULATORY_CARE_PROVIDER_SITE_OTHER): Payer: BC Managed Care – PPO | Admitting: Cardiovascular Disease

## 2014-01-13 ENCOUNTER — Encounter: Payer: Self-pay | Admitting: Cardiovascular Disease

## 2014-01-13 VITALS — BP 112/76 | HR 90 | Ht 70.0 in | Wt 198.8 lb

## 2014-01-13 DIAGNOSIS — Z9889 Other specified postprocedural states: Secondary | ICD-10-CM

## 2014-01-13 DIAGNOSIS — I340 Nonrheumatic mitral (valve) insufficiency: Secondary | ICD-10-CM

## 2014-01-13 LAB — POCT INR: INR: 2.4

## 2014-01-13 MED ORDER — ASPIRIN EC 81 MG PO TBEC: 81.0000 mg | DELAYED_RELEASE_TABLET | Freq: Every day | ORAL | Status: DC

## 2014-01-13 NOTE — Progress Notes (Signed)
Background: The patient is followed for mitral valve disease status post minimally invasive mitral valve repair in July 2015. The mechanism of his mitral valve disease was mitral valve prolapse with a flail segment of the posterior leaflet. 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  HPI:  48 year old gentleman presenting for follow-up evaluation. He is now 3 months out from cardiac surgery. He is doing well from a cardiac perspective. He continues to complain of very mild shortness of breath with climbing stairs. He is able to walk 3 miles without symptoms. He denies heart palpitations, chest pain, or leg swelling. He has had some problems with gout, most recently affecting the right great toe.  Studies:  2-D echocardiogram 11/08/2013 (postoperative study) Study Conclusions  - Left ventricle: The cavity size was normal. Wall thickness was increased in a pattern of mild LVH. Systolic function was mildly to moderately reduced. The estimated ejection fraction was in the range of 40% to 45%. Diffuse hypokinesis. Doppler parameters are consistent with high ventricular filling pressure. - Mitral valve: Prior procedures included surgical repair. The findings are consistent with mild stenosis.  Impressions:  - Mild to moderate LV dysfunction (EF 40-45); s/p MV repair; mild MS by mean gradient; no MR. Compared to 08/19/13, LV function is worse and patient is now s/p mitral valve repair.  Outpatient Encounter Prescriptions as of 01/13/2014  Medication Sig  . atorvastatin (LIPITOR) 10 MG tablet Take 10 mg by mouth daily.  Marland Kitchen. lisinopril (PRINIVIL,ZESTRIL) 2.5 MG tablet Take 1 tablet (2.5 mg total) by mouth daily.  . metoprolol tartrate (LOPRESSOR) 25 MG tablet Take 0.5 tablets (12.5 mg total) by mouth 2 (two) times daily.  . [DISCONTINUED] warfarin (COUMADIN) 5 MG tablet Take 1 tablet (5 mg total) by  mouth daily at 6 PM.  . aspirin EC 81 MG tablet Take 1 tablet (81 mg total) by mouth daily.  . [DISCONTINUED] colchicine 0.6 MG tablet 1.2 mg at the first sign of flare, followed in 1 hour with a single dose of 0.6 mg, then on day 2 change to 1 tab twice daily    No Known Allergies  Past Medical History  Diagnosis Date  . Gout     but doesn't take any meds for this  . Mitral regurgitation due to cusp prolapse 08/26/2013  . Coronary artery disease 09/18/2013    40% stenosis RCA and otherwise non-obstructive CAD   . History of bronchitis 2 yrs ago  . Dizziness     r/t mitral valve  . Hyperlipidemia   . H/O hiatal hernia   . GERD (gastroesophageal reflux disease)     no meds  . 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    Complex valvuloplasty including triangular resection of flail posterior leaflet, artificial Gore-tex neocord placement x4, Sorin Memo 3D ring annuloplasty (size 34 mm) via right mini thoracotomy approach    family history is not on file.    BP 112/76  Pulse 90  Ht 5\' 10"  (1.778 m)  Wt 198 lb 12.8 oz (90.175 kg)  BMI 28.52 kg/m2  PHYSICAL EXAM: Pt is alert and oriented, NAD HEENT: normal Neck: JVP - normal, carotids 2+= without bruits Lungs: CTA bilaterally CV: RRR without murmur or gallop Abd: soft, NT, Positive BS, no hepatomegaly Ext: no C/C/E, distal pulses intact and equal Skin: warm/dry no rash  ASSESSMENT AND PLAN: 1. Mitral valve prolapse/regurgitation now 3 months out from minimally invasive mitral  valve repair. 2. 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 Hyperlipidemia 4. Nonobstructive coronary artery disease 5. Gout  The patient is clinically stable. He is on appropriate medical therapy with low doses of metoprolol and lisinopril. I do not think his blood pressure will allow for aggressive medicine titration at this point. I would like to repeat an echocardiogram 6 months  out from his last study. Will arrange for a follow-up office visit after his echo is completed. He brings in outside labs today with an excellent reduction in his cholesterol. Total cholesterol is 156, tragus right 95, HDL 45, and LDL 92. His baseline cholesterol before atorvastatin was in the range of 270. The patient was advised that he can stop warfarin now that he is 3 months out from mitral valve repair. I discussed this with Dr. Cornelius Moras. The patient will start on aspirin 81 mg daily. I will see him back in 6 months after his echocardiogram. Overall he seems to be doing very well.  Tonny Bollman, MD 01/13/2014 5:17 PM

## 2014-01-13 NOTE — Patient Instructions (Signed)
Your physician has recommended you make the following change in your medication:  1. STOP WARFARIN 2. START Aspirin 81mg  once a day in 3-4 days  Your physician has requested that you have an echocardiogram in [facility] 2016. Echocardiography is a painless test that uses sound waves to create images of your heart. It provides your doctor with information about the size and shape of your heart and how well your heart's chambers and valves are working. This procedure takes approximately one hour. There are no restrictions for this procedure.  Your physician wants you to follow-up in: MARCH 2016 with Dr Excell Seltzer.  You will receive a reminder letter in the mail two months in advance. If you don't receive a letter, please call our office to schedule the follow-up appointment.

## 2014-01-14 ENCOUNTER — Ambulatory Visit: Payer: Self-pay | Admitting: Cardiovascular Disease

## 2014-01-14 DIAGNOSIS — Z9889 Other specified postprocedural states: Secondary | ICD-10-CM

## 2014-01-17 ENCOUNTER — Ambulatory Visit: Payer: BC Managed Care – PPO | Admitting: Cardiovascular Disease

## 2014-01-17 ENCOUNTER — Encounter: Payer: Self-pay | Admitting: *Deleted

## 2014-01-17 ENCOUNTER — Telehealth: Payer: Self-pay | Admitting: Family Medicine

## 2014-01-17 MED ORDER — COLCHICINE 0.6 MG PO TABS: 0.6000 mg | ORAL_TABLET | Freq: Two times a day (BID) | ORAL | Status: DC

## 2014-01-17 NOTE — Telephone Encounter (Signed)
Limited beer okay with meds, of course it can increase likelyhood of gout flare.  But 1 here and there can be okay.

## 2014-01-17 NOTE — Telephone Encounter (Signed)
Don notified as instructed by telephone.  Lawrence Underwood request a prescription for colchicine be sent to [facility].  Lawrence Underwood also states while being on coumadin Lawrence Underwood avoided alcohol and now that Lawrence Underwood has stopped coumadin, Lawrence Underwood wants to know if it would be okay to drink an occassional beer.  Lawrence Underwood just wants to make sure alcohol will not interact with any of the other medications Lawrence Underwood is on.  Please advise.

## 2014-01-17 NOTE — Telephone Encounter (Signed)
Pt is feeling a gout flare up coming up. Pt is off of coumadin and stopped taking that as of Monday 10/26 as advised by his Cardiologist. Pt is requesting to see if he can start taking another medicine instead of the colchicine because that medication is quite expensive. Pt is requesting a call back regarding this and to go over his options.

## 2014-01-17 NOTE — Telephone Encounter (Signed)
Okay to refill colchicine

## 2014-01-17 NOTE — Telephone Encounter (Signed)
Colchicine is the best safest med for gout in this pt. We occ use NSAIDs or prednisone but I would not recommend this with his recent heart history.

## 2014-01-17 NOTE — Telephone Encounter (Signed)
Prescription refill for colchicine sent to [facility].  MyChart message sent to patient in regard to alcohol intake per his request.

## 2014-01-20 ENCOUNTER — Encounter: Payer: Self-pay | Admitting: Thoracic Surgery (Cardiothoracic Vascular Surgery)

## 2014-01-20 ENCOUNTER — Ambulatory Visit (INDEPENDENT_AMBULATORY_CARE_PROVIDER_SITE_OTHER): Payer: BC Managed Care – PPO | Admitting: Thoracic Surgery (Cardiothoracic Vascular Surgery)

## 2014-01-20 VITALS — BP 113/76 | HR 91 | Ht 70.0 in | Wt 198.0 lb

## 2014-01-20 DIAGNOSIS — Z9889 Other specified postprocedural states: Secondary | ICD-10-CM

## 2014-01-20 NOTE — Patient Instructions (Signed)

## 2014-01-20 NOTE — Progress Notes (Signed)
      [address]       [name] [address]             [phone]     CARDIOTHORACIC SURGERY OFFICE NOTE  Referring Provider is Tonny Bollman, MD PCP is [name], MD   HPI:  Patient returns for routine followup status post minimally invasive mitral valve repair on 10/11/2013. His postoperative recovery in the hospital was uncomplicated and he was discharged home on the fourth postoperative day.  He has been followed carefully by Dr. Excell Seltzer. Echocardiogram performed 11/08/2013 revealed intact mitral valve repair with no residual mitral regurgitation. Left ventricular ejection fraction was 40-45%.  He was last seen here in our office on 11/18/2013.  Since then he has continued to do very well. He reports gradual improvement in his exercise tolerance. He still gets short of breath if he pushes himself, such as going up a flight of stairs. He has not resumed jogging yet but he walks on a regular basis. Overall he feels well and he has no complaints. He was seen recently by Dr. Excell Seltzer who stopped his Coumadin. He is now taking aspirin daily.   Current Outpatient Prescriptions  Medication Sig Dispense Refill  . aspirin EC 81 MG tablet Take 1 tablet (81 mg total) by mouth daily.    Marland Kitchen atorvastatin (LIPITOR) 10 MG tablet Take 10 mg by mouth daily.    . colchicine 0.6 MG tablet Take 1 tablet (0.6 mg total) by mouth 2 (two) times daily. 60 tablet 2  . lisinopril (PRINIVIL,ZESTRIL) 2.5 MG tablet Take 1 tablet (2.5 mg total) by mouth daily. 30 tablet 3  . metoprolol tartrate (LOPRESSOR) 25 MG tablet Take 0.5 tablets (12.5 mg total) by mouth 2 (two) times daily. 30 tablet 3   No current facility-administered medications for this visit.      Physical Exam:   BP 113/76 mmHg  Pulse 91  Ht 5\' 10"  (1.778 m)  Wt 198 lb (89.812 kg)  BMI 28.41 kg/m2  SpO2 96%  General:  Well-appearing  Chest:   Clear  CV:   Regular rate and rhythm without murmur  Incisions:  Completely  healed  Abdomen:  Soft and nontender  Extremities:  Warm and well-perfused  Diagnostic Tests:  n/a   Impression:  Patient is doing well 3 months status post minimally invasive mitral valve repair.  Plan:  The patient will continue to follow-up with Dr. Excell Seltzer for long-term attention to his medical therapy. He has been reminded regarding the lifelong need for antibiotic prophylaxis for all dental cleaning and related procedures. We will plan to see the patient back next summer approximately 1 year following his original surgery to make sure that he continues to do well.  I spent in excess of 15 minutes during the conduct of this office consultation and >50% of this time involved direct face-to-face encounter with the patient for counseling and/or coordination of their care.   Salvatore Decent. Cornelius Moras, MD 01/20/2014 12:51 PM

## 2014-02-06 ENCOUNTER — Other Ambulatory Visit: Payer: Self-pay | Admitting: Physician Assistant

## 2014-02-08 ENCOUNTER — Encounter: Payer: Self-pay | Admitting: Nurse Practitioner

## 2014-02-08 ENCOUNTER — Other Ambulatory Visit: Payer: Self-pay | Admitting: Physician Assistant

## 2014-02-09 ENCOUNTER — Encounter: Payer: Self-pay | Admitting: Nurse Practitioner

## 2014-02-10 ENCOUNTER — Other Ambulatory Visit: Payer: Self-pay | Admitting: *Deleted

## 2014-02-10 ENCOUNTER — Other Ambulatory Visit: Payer: Self-pay

## 2014-02-10 MED ORDER — METOPROLOL TARTRATE 25 MG PO TABS: 12.5000 mg | ORAL_TABLET | Freq: Two times a day (BID) | ORAL | Status: DC

## 2014-02-10 MED ORDER — ATORVASTATIN CALCIUM 10 MG PO TABS: 10.0000 mg | ORAL_TABLET | Freq: Every day | ORAL | Status: DC

## 2014-02-17 ENCOUNTER — Encounter: Payer: Self-pay | Admitting: Family Medicine

## 2014-02-17 NOTE — Telephone Encounter (Signed)
Pt comes in today to request maybe starting allopurinol as a maintaince drug for his gout and using the colcrys or colchicine for when he has a flair up. Pt is requesting a call back.

## 2014-02-19 ENCOUNTER — Encounter: Payer: Self-pay | Admitting: Family Medicine

## 2014-02-19 ENCOUNTER — Other Ambulatory Visit: Payer: Self-pay | Admitting: Family Medicine

## 2014-02-19 MED ORDER — ALLOPURINOL 100 MG PO TABS: 100.0000 mg | ORAL_TABLET | Freq: Every day | ORAL | Status: DC

## 2014-02-27 ENCOUNTER — Encounter (HOSPITAL_COMMUNITY): Payer: Self-pay | Admitting: Cardiovascular Disease

## 2014-03-03 ENCOUNTER — Other Ambulatory Visit: Payer: Self-pay | Admitting: Family Medicine

## 2014-03-03 ENCOUNTER — Encounter: Payer: Self-pay | Admitting: Family Medicine

## 2014-03-03 MED ORDER — COLCHICINE 0.6 MG PO TABS: ORAL_TABLET | ORAL | Status: DC

## 2014-03-06 ENCOUNTER — Other Ambulatory Visit: Payer: Self-pay | Admitting: Cardiovascular Disease

## 2014-03-08 ENCOUNTER — Encounter: Payer: Self-pay | Admitting: Nurse Practitioner

## 2014-03-10 MED ORDER — METOPROLOL TARTRATE 25 MG PO TABS: 12.5000 mg | ORAL_TABLET | Freq: Two times a day (BID) | ORAL | Status: DC

## 2014-03-20 ENCOUNTER — Encounter: Payer: Self-pay | Admitting: Family Medicine

## 2014-03-20 MED ORDER — ALLOPURINOL 100 MG PO TABS: 100.0000 mg | ORAL_TABLET | Freq: Every day | ORAL | Status: DC

## 2014-04-09 ENCOUNTER — Encounter: Payer: Self-pay | Admitting: Family Medicine

## 2014-05-18 ENCOUNTER — Other Ambulatory Visit: Payer: Self-pay | Admitting: Family Medicine

## 2014-06-02 ENCOUNTER — Ambulatory Visit (HOSPITAL_COMMUNITY): Payer: BLUE CROSS/BLUE SHIELD | Attending: Cardiovascular Disease | Admitting: Radiology

## 2014-06-02 DIAGNOSIS — I340 Nonrheumatic mitral (valve) insufficiency: Secondary | ICD-10-CM | POA: Diagnosis present

## 2014-06-02 DIAGNOSIS — I509 Heart failure, unspecified: Secondary | ICD-10-CM

## 2014-06-02 NOTE — Progress Notes (Signed)
Echocardiogram performed.  

## 2014-06-05 ENCOUNTER — Encounter: Payer: Self-pay | Admitting: Cardiovascular Disease

## 2014-06-05 ENCOUNTER — Ambulatory Visit (INDEPENDENT_AMBULATORY_CARE_PROVIDER_SITE_OTHER): Payer: BLUE CROSS/BLUE SHIELD | Admitting: Cardiovascular Disease

## 2014-06-05 VITALS — BP 114/78 | HR 76 | Ht 70.0 in | Wt 202.8 lb

## 2014-06-05 DIAGNOSIS — I341 Nonrheumatic mitral (valve) prolapse: Secondary | ICD-10-CM

## 2014-06-05 DIAGNOSIS — I340 Nonrheumatic mitral (valve) insufficiency: Secondary | ICD-10-CM

## 2014-06-05 DIAGNOSIS — Z9889 Other specified postprocedural states: Secondary | ICD-10-CM

## 2014-06-05 MED ORDER — LOSARTAN POTASSIUM 25 MG PO TABS: 25.0000 mg | ORAL_TABLET | Freq: Every day | ORAL | Status: DC

## 2014-06-05 NOTE — Patient Instructions (Addendum)
Your physician has recommended you make the following change in your medication:  1. STOP Lisinopril 2. START Losartan 25mg  take one by mouth daily  Your physician wants you to follow-up in: 1 YEAR with Dr Excell Seltzer.  You will receive a reminder letter in the mail two months in advance. If you don't receive a letter, please call our office to schedule the follow-up appointment.  Your physician discussed the importance of taking an antibiotic prior to any dental, gastrointestinal, genitourinary procedures to prevent damage to the heart valves from infection. You were given a prescription for an antibiotic based on current SBE prophylaxis guidelines.

## 2014-06-05 NOTE — Progress Notes (Signed)
Cardiology Office Note   Date:  06/05/2014   ID:  Lawrence Underwood, DOB 10/23/1965, MRN [id number]  PCP:  [name], MD  Cardiologist:  Tonny Bollman, MD    Chief Complaint  Patient presents with  . Here for follow-up of mitral valve disease    History of Present Illness: Lawrence Underwood is a 49 y.o. male who presents for follow-up of mitral valve disease status post minimally invasive mitral valve repair in July 2015. The mechanism of his mitral valve disease was mitral valve prolapse with a flail segment of the posterior leaflet. 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 On the patient's initial postoperative echo, he had mild LV dysfunction with normal mitral valve function post repair. He had a follow-up echocardiogram March 14 demonstrating normalization of LV function and continued normal function of his mitral repair. He presents today for follow-up evaluation.  The patient is doing very well. He denies chest pain, shortness of breath, or heart palpitations. He has no cardiac concerns today. He has developed chronic nonproductive cough. He denies fevers, chills, or other complaints.  Past Medical History  Diagnosis Date  . Gout     but doesn't take any meds for this  . Mitral regurgitation due to cusp prolapse 08/26/2013  . Coronary artery disease 09/18/2013    40% stenosis RCA and otherwise non-obstructive CAD   . History of bronchitis 2 yrs ago  . Dizziness     r/t mitral valve  . Hyperlipidemia   . H/O hiatal hernia   . GERD (gastroesophageal reflux disease)     no meds  . 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    Complex valvuloplasty including triangular resection of flail posterior leaflet, artificial Gore-tex neocord placement x4, Sorin Memo 3D ring annuloplasty (size 34 mm) via right mini thoracotomy approach    Past Surgical History  Procedure Laterality  Date  . Hand surgery      R hand  . Knee surgery Left     arthroscopy  . Tee without cardioversion N/A 08/26/2013    Procedure: TRANSESOPHAGEAL ECHOCARDIOGRAM (TEE);  Surgeon: [name], MD;  Location: [facility] ENDOSCOPY;  Service: Cardiovascular;  Laterality: N/A;  . Wisdom tooth extraction    . Cardiac catheterization  09/18/13  . Mitral valve repair Right 10/11/2013    Procedure: MINIMALLY INVASIVE MITRAL VALVE REPAIR (MVR);  Surgeon: Purcell Nails, MD;  Location: [facility] OR;  Service: Open Heart Surgery;  Laterality: Right;  . Intraoperative transesophageal echocardiogram N/A 10/11/2013    Procedure: INTRAOPERATIVE TRANSESOPHAGEAL ECHOCARDIOGRAM;  Surgeon: Purcell Nails, MD;  Location: [facility] OR;  Service: Open Heart Surgery;  Laterality: N/A;  . Left and right heart catheterization with coronary angiogram N/A 09/18/2013    Procedure: LEFT AND RIGHT HEART CATHETERIZATION WITH CORONARY ANGIOGRAM;  Surgeon: Micheline Chapman, MD;  Location: [facility] CATH LAB;  Service: Cardiovascular;  Laterality: N/A;    Current Outpatient Prescriptions  Medication Sig Dispense Refill  . allopurinol (ZYLOPRIM) 100 MG tablet TAKE 1 TABLET (100 MG TOTAL) BY MOUTH DAILY. 30 tablet 2  . aspirin EC 81 MG tablet Take 1 tablet (81 mg total) by mouth daily.    Marland Kitchen atorvastatin (LIPITOR) 10 MG tablet Take 1 tablet (10 mg total) by mouth daily. 30 tablet 3  . metoprolol tartrate (LOPRESSOR) 25 MG tablet Take 0.5 tablets (12.5 mg total) by mouth 2 (two) times daily. 90 tablet 3  . losartan (  COZAAR) 25 MG tablet Take 1 tablet (25 mg total) by mouth daily. 90 tablet 3   No current facility-administered medications for this visit.    Allergies:   Lisinopril   Social History:  The patient  reports that he has never smoked. He has never used smokeless tobacco. He reports that he drinks alcohol. He reports that he does not use illicit drugs.   Family History:  The patient's family history includes CVA (age of onset: 73) in his father;  Gout in his father, maternal grandfather, maternal grandmother, paternal grandfather, and paternal grandmother; Heart attack (age of onset: 45) in his father; Hyperlipidemia in his mother.    ROS:  Please see the history of present illness.  All other systems are reviewed and negative.    PHYSICAL EXAM: VS:  BP 114/78 mmHg  Pulse 76  Ht  (1.778 m)  Wt 202 lb 12.8 oz (91.989 kg)  BMI 29.10 kg/m2 , BMI Body mass index is 29.1 kg/(m^2). GEN: Well nourished, well developed, in no acute distress HEENT: normal Neck: no JVD, no masses. No carotid bruits Cardiac: RRR without murmur or gallop                Respiratory:  clear to auscultation bilaterally, normal work of breathing GI: soft, nontender, nondistended, + BS MS: no deformity or atrophy Ext: no pretibial edema, pedal pulses 2+= bilaterally Skin: warm and dry, no rash Neuro:  Strength and sensation are intact Psych: euthymic mood, full affect  EKG:  EKG is ordered today. The ekg ordered today shows normal sinus rhythm 76 bpm, within normal limits.  Recent Labs: 10/09/2013: ALT 18 10/12/2013: Magnesium 2.3 11/05/2013: BUN 14; Creatinine 1.0; Hemoglobin 13.8; Platelets 380.0; Potassium 4.1; Sodium 139   Lipid Panel     Component Value Date/Time   CHOL 261 02/23/2007 0908   TRIG 280 02/23/2007 0908   HDL 38 02/23/2007 0908   LDLCALC 167 02/23/2007 0908      Wt Readings from Last 3 Encounters:  06/05/14 202 lb 12.8 oz (91.989 kg)  01/20/14 198 lb (89.812 kg)  01/13/14 198 lb 12.8 oz (90.175 kg)     Cardiac Studies Reviewed: 2D Echo: Study Conclusions  - Left ventricle: The cavity size was normal. Wall thickness was increased in a pattern of mild LVH. Systolic function was normal. The estimated ejection fraction was in the range of 50% to 55%. Wall motion was normal; there were no regional wall motion abnormalities. Doppler parameters are consistent with abnormal left ventricular relaxation (grade 1  diastolic dysfunction). Doppler parameters are consistent with high ventricular filling pressure. - Mitral valve: Prior procedures included surgical repair. The findings are consistent with mild stenosis.  Impressions:  - Normal LV function; grade 1 diastolic dysfunction; s/p MV repair with mild MS; no MR.  ASSESSMENT AND PLAN: 1.  Mitral valve disease status post mitral valve repair: The patient's echocardiogram was reviewed with him. His LV function has normalized. His mitral repair site is intact. He follows SBE prophylaxis.  2. Coronary artery disease, nonobstructive: No anginal symptoms. He is going to increase his exercise program. The patient remains on antiplatelet therapy with aspirin and a low-dose statin drug.  3. Hyperlipidemia: The patient is on atorvastatin. I reviewed lab work from October which is done through his employer. He will continue to send lab work when it is checked.  4. Nonischemic cardiomyopathy: The patient had a mild cardiomyopathy, likely related to his mitral regurgitation. LV function has improved and  is now in the normal range. I think he should remain on metoprolol. I'm going to change lisinopril to losartan 25 mg daily because I suspect his cough is related to the ACE inhibitor.   Current medicines are reviewed with the patient today.  The patient does not have concerns regarding medicines.  The following changes have been made:  See above  Labs/ tests ordered today include:   Orders Placed This Encounter  Procedures  . EKG 12-Lead   Disposition:   FU one year  Signed, Tonny Bollman, MD  06/05/2014 5:28 PM    [facility] [address] [place], [place], [place]  [address] Phone: [phone]; Fax: [phone]

## 2014-06-06 ENCOUNTER — Other Ambulatory Visit: Payer: Self-pay | Admitting: Cardiovascular Disease

## 2014-06-26 ENCOUNTER — Telehealth: Payer: Self-pay | Admitting: Cardiovascular Disease

## 2014-06-26 NOTE — Telephone Encounter (Signed)
I spoke with the pt and he forgot to take his medications while traveling.  The pt last took his medications on Tuesday and will be back home this evening.  The pt said he does not feel great and is a little lightheaded but he felt like this was due to not having his medications.  The pt did not get the opportunity to check his BP.  I advised the pt that at this time he needs to take his medications when he gets home.  The pt will call back if he decides that he wants to pick up Losartan and Metoprolol, then I will call this into a local pharmacy.

## 2014-06-26 NOTE — Telephone Encounter (Signed)
New message     Pt c/o medication issue:  1. Name of Medication:  Losartan, metoprolol, aspirin, atorvastatin, allopurinol  2. How are you currently taking this medication (dosage and times per day)?   3. Are you having a reaction (difficulty breathing--STAT)?   4. What is your medication issue? Pt is traveling.  Forgot to pack his medications.  Last day he took meds was Tuesday.  He will be back home later today, but he is feeling lightheaded. Should he just wait until he gets home or should he have a pill called in to a pharmacy.  When you call him back, he will hopefully have a number of a pharmacy and hopefully have his bp checked at a [facility] or somewhere.

## 2014-06-26 NOTE — Telephone Encounter (Signed)
Left message on machine for pt to contact the office.   

## 2014-06-30 ENCOUNTER — Telehealth: Payer: Self-pay | Admitting: Cardiovascular Disease

## 2014-06-30 ENCOUNTER — Encounter: Payer: Self-pay | Admitting: Nurse Practitioner

## 2014-06-30 MED ORDER — LISINOPRIL 2.5 MG PO TABS: 2.5000 mg | ORAL_TABLET | Freq: Every day | ORAL | Status: DC

## 2014-06-30 NOTE — Telephone Encounter (Signed)
New message    Patient  calling     Pt C/O medication issue:  1. Name of Medication: new medication in middle of march - losartan 25 mg   2. How are you currently taking this medication (dosage and times per day)? One pill in am.   3. Are you having a reaction (difficulty breathing--STAT)? Side effect -  Sob. H/O MVP repair in July.    4. What is your medication issue? Would like to discuss .

## 2014-06-30 NOTE — Telephone Encounter (Signed)
I spoke with the pt and he is currently taking losartan 25mg  daily.  This was changed at his most recent office visit due to an "annoying cough" with lisinopril 2.5mg  daily. Since the pt made this change he has noticed SOB with during conversations but his cough resolved.  The pt feels like his SOB is coming from losartan.  At this time the pt will go back to taking lisinopril to see if his SOB resolves. Rx sent to the pharmacy.

## 2014-06-30 NOTE — Telephone Encounter (Signed)
Dr Excell Seltzer aware and agreed with plan.

## 2014-07-16 ENCOUNTER — Encounter: Payer: Self-pay | Admitting: Nurse Practitioner

## 2014-09-08 ENCOUNTER — Other Ambulatory Visit: Payer: Self-pay | Admitting: Cardiovascular Disease

## 2014-10-27 ENCOUNTER — Ambulatory Visit (INDEPENDENT_AMBULATORY_CARE_PROVIDER_SITE_OTHER): Payer: BLUE CROSS/BLUE SHIELD | Admitting: Thoracic Surgery (Cardiothoracic Vascular Surgery)

## 2014-10-27 ENCOUNTER — Encounter: Payer: Self-pay | Admitting: Thoracic Surgery (Cardiothoracic Vascular Surgery)

## 2014-10-27 VITALS — BP 131/79 | HR 79 | Resp 20 | Ht 70.0 in | Wt 195.0 lb

## 2014-10-27 DIAGNOSIS — Z9889 Other specified postprocedural states: Secondary | ICD-10-CM

## 2014-10-27 DIAGNOSIS — I340 Nonrheumatic mitral (valve) insufficiency: Secondary | ICD-10-CM

## 2014-10-27 DIAGNOSIS — I341 Nonrheumatic mitral (valve) prolapse: Secondary | ICD-10-CM

## 2014-10-27 NOTE — Progress Notes (Signed)
[address]       [name] [address]             [phone]     CARDIOTHORACIC SURGERY OFFICE NOTE  Referring Provider is Tonny Bollman, MD PCP is [name], MD   HPI:  Patient returns for routine follow-up approximately one year status post minimally invasive mitral valve repair on 10/11/2013. He was last seen here in our office on 01/20/2014. Since then he has continued to do very well. He was seen in follow-up by Dr. Excell Seltzer on 06/05/2014. Routine follow-up echocardiogram performed at that time demonstrated intact mitral valve repair with no residual mitral regurgitation. Left ventricular function had improved with ejection fraction estimated 50-55%. Mean transvalvular gradient across the mitral valve was estimated 6 mmHg.  At that time the patient was reportedly complaining about a chronic dry nonproductive cough. He was switched from lisinopril to losartan at that time. However, the patient states that shortly after that he developed increased exertional shortness of breath. Losartan was discontinued and lisinopril resumed. The patient returns to our office today feeling quite well. He denies any symptoms of exertional shortness of breath, chest tightness, palpitations, or dizzy spells. He does note that his peak exercise capacity is not where he would hope it to be. Specifically, when he goes jogging he seems to "hit a wall" when he reaches somewhere between 1/2 mile and a mile. He otherwise feels quite well and reports no physical limitations whatsoever.   Current Outpatient Prescriptions  Medication Sig Dispense Refill  . allopurinol (ZYLOPRIM) 100 MG tablet TAKE 1 TABLET (100 MG TOTAL) BY MOUTH DAILY. 30 tablet 2  . aspirin EC 81 MG tablet Take 1 tablet (81 mg total) by mouth daily.    Marland Kitchen atorvastatin (LIPITOR) 10 MG tablet TAKE 1 TABLET (10 MG TOTAL) BY MOUTH DAILY. 30 tablet 9  . lisinopril (PRINIVIL,ZESTRIL) 2.5 MG tablet Take 1 tablet (2.5 mg total) by  mouth daily. 30 tablet 3  . metoprolol tartrate (LOPRESSOR) 25 MG tablet Take 0.5 tablets (12.5 mg total) by mouth 2 (two) times daily. 90 tablet 3   No current facility-administered medications for this visit.      Physical Exam:   BP 131/79 mmHg  Pulse 79  Resp 20  Ht  (1.778 m)  Wt 195 lb (88.451 kg)  BMI 27.98 kg/m2  SpO2 98%  General:  Well-appearing  Chest:   clear  CV:   Regular rate and rhythm without murmur  Incisions:  Completely healed  Abdomen:  Soft and nontender  Extremities:  Warm and well-perfused  Diagnostic Tests:  Transthoracic Echocardiography  Patient:  Atharva, Mirsky MR #:    [id number] Study Date: 06/02/2014 Gender:   M Age:    48 Height:   177.8 cm Weight:   90.7 kg BSA:    2.14 m^2 Pt. Status: Room:  ATTENDING  Tonny Bollman ORDERING   Tonny Bollman REFERRING  Tonny Bollman SONOGRAPHER Aida Raider, RDCS PERFORMING  [facility], Outpatient  cc:  ------------------------------------------------------------------- LV EF: 50% -  55%  ------------------------------------------------------------------- Indications:   I50.9 Mitral Valve Disorder.  ------------------------------------------------------------------- History:  PMH: Acquired from the patient and from the patient&'s chart. PMH: Murmur. MVP. CAD. Risk factors: Hypercholesterolemia.  ------------------------------------------------------------------- Study Conclusions  - Left ventricle: The cavity size was normal. Wall thickness was increased in a pattern of mild LVH. Systolic function was normal. The estimated ejection fraction was in the range of 50% to 55%. Wall motion  was normal; there were no regional wall motion abnormalities. Doppler parameters are consistent with abnormal left ventricular relaxation (grade 1 diastolic dysfunction). Doppler parameters are consistent with high ventricular  filling pressure. - Mitral valve: Prior procedures included surgical repair. The findings are consistent with mild stenosis.  Impressions:  - Normal LV function; grade 1 diastolic dysfunction; s/p MV repair with mild MS; no MR.  ------------------------------------------------------------------- Labs, prior tests, procedures, and surgery: Status post Mitral Valve Repair.     Transthoracic echocardiography. M-mode, complete 2D, spectral Doppler, and color Doppler. Birthdate: Patient birthdate: May 02, 1965. Age: Patient is 49 yr old. Sex: Gender: male.  BMI: 28.7 kg/m^2. Patient status: Outpatient. Study date: Study date: 06/02/2014. Study time: 04:01 PM. Location: [facility]  -------------------------------------------------------------------  ------------------------------------------------------------------- Left ventricle: The cavity size was normal. Wall thickness was increased in a pattern of mild LVH. Systolic function was normal. The estimated ejection fraction was in the range of 50% to 55%. Wall motion was normal; there were no regional wall motion abnormalities. Doppler parameters are consistent with abnormal left ventricular relaxation (grade 1 diastolic dysfunction). Doppler parameters are consistent with high ventricular filling pressure.  ------------------------------------------------------------------- Aortic valve:  Trileaflet; mildly thickened leaflets. Mobility was not restricted. Doppler: Transvalvular velocity was within the normal range. There was no stenosis. There was no regurgitation.  ------------------------------------------------------------------- Aorta: Aortic root: The aortic root was normal in size.  ------------------------------------------------------------------- Mitral valve: Prior procedures included surgical repair. Mobility was not restricted. Doppler:  The findings are consistent with mild  stenosis.  There was no regurgitation.  Valve area by pressure half-time: 1.75 cm^2. Indexed valve area by pressure half-time: 0.82 cm^2/m^2. Valve area by continuity equation (using LVOT flow): 1.39 cm^2. Indexed valve area by continuity equation (using LVOT flow): 0.65 cm^2/m^2.  Mean gradient (D): 6 mm Hg. Peak gradient (D): 8 mm Hg.  ------------------------------------------------------------------- Left atrium: The atrium was normal in size.  ------------------------------------------------------------------- Right ventricle: The cavity size was normal. Systolic function was normal.  ------------------------------------------------------------------- Pulmonic valve:  Doppler: Transvalvular velocity was within the normal range. There was no evidence for stenosis.  ------------------------------------------------------------------- Tricuspid valve:  Structurally normal valve.  Doppler: Transvalvular velocity was within the normal range. There was trivial regurgitation.  ------------------------------------------------------------------- Right atrium: The atrium was normal in size.  ------------------------------------------------------------------- Pericardium: There was no pericardial effusion.  ------------------------------------------------------------------- Systemic veins: Inferior vena cava: The vessel was normal in size.  ------------------------------------------------------------------- Measurements  Left ventricle              Value     Reference LV ID, ED, PLAX chordal     (L)    38.1 mm    43 - 52 LV ID, ES, PLAX chordal     (L)    22.7 mm    23 - 38 LV fx shortening, PLAX chordal      40  %    >=29 LV PW thickness, ED            13.4 mm    --------- IVS/LV PW ratio, ED            0.8      <=1.3 Stroke volume, 2D             66  ml     --------- Stroke volume/bsa, 2D           31  ml/m^2  --------- LV e&', lateral              7.52 cm/s   --------- LV  E/e&', lateral             18.62     --------- LV e&', medial               7.13 cm/s   --------- LV E/e&', medial              19.64     --------- LV e&', average              7.33 cm/s   --------- LV E/e&', average             19.11     ---------  Ventricular septum            Value     Reference IVS thickness, ED             10.7 mm    ---------  LVOT                   Value     Reference LVOT ID, S                21  mm    --------- LVOT area                 3.46 cm^2   --------- LVOT peak velocity, S           112  cm/s   --------- LVOT mean velocity, S           83.7 cm/s   --------- LVOT VTI, S                19.1 cm    --------- LVOT peak gradient, S           5   mm Hg  ---------  Aorta                   Value     Reference Aortic root ID, ED            30  mm    ---------  Left atrium                Value     Reference LA ID, A-P, ES              34  mm    --------- LA ID/bsa, A-P              1.59 cm/m^2  <=2.2 LA volume, S               41  ml    --------- LA volume/bsa, S             19.2 ml/m^2  --------- LA volume, ES, 1-p A4C          34  ml    --------- LA volume/bsa, ES, 1-p A4C        15.9 ml/m^2  --------- LA volume, ES, 1-p A2C          49  ml    --------- LA volume/bsa, ES, 1-p A2C        22.9 ml/m^2  ---------  Mitral valve               Value      Reference Mitral E-wave peak velocity        140  cm/s   --------- Mitral A-wave peak velocity        144  cm/s   --------- Mitral mean velocity, D  110  cm/s   --------- Mitral deceleration time    (H)    342  ms    150 - 230 Mitral pressure half-time         120  ms    --------- Mitral mean gradient, D          6   mm Hg  --------- Mitral peak gradient, D          8   mm Hg  --------- Mitral E/A ratio, peak          1       --------- Mitral valve area, PHT, DP        1.75 cm^2   --------- Mitral valve area/bsa, PHT, DP      0.82 cm^2/m^2 --------- Mitral valve area, LVOT          1.39 cm^2   --------- continuity Mitral valve area/bsa, LVOT        0.65 cm^2/m^2 --------- continuity Mitral annulus VTI, D           47.5 cm    ---------  Right ventricle              Value     Reference RV s&', lateral, S             10.4 cm/s   ---------  Legend: (L) and (H) mark values outside specified reference range.  ------------------------------------------------------------------- Prepared and Electronically Authenticated by  Olga Millers 2016-03-14T17:40:06        Impression:  Patient is doing very well approximately one year following minimally invasive mitral valve repair. Late follow-up echocardiogram looks good with intact repair and low normal left ventricular systolic function.    Plan:  In the future patient will call and return to see [place] as needed. He has been reminded regarding the lifelong need for anti-biotic prophylaxis from a dental cleaning and related procedures.  We have not recommended any changes to the patient's current medications at this time. All questions have been answered.  I spent in excess of 15 minutes during the conduct of this office  consultation and >50% of this time involved direct face-to-face encounter with the patient for counseling and/or coordination of their care.    Salvatore Decent. Cornelius Moras, MD 10/27/2014 12:24 PM

## 2014-10-27 NOTE — Patient Instructions (Signed)

## 2014-11-01 ENCOUNTER — Other Ambulatory Visit: Payer: Self-pay | Admitting: Cardiovascular Disease

## 2015-01-19 ENCOUNTER — Other Ambulatory Visit: Payer: Self-pay

## 2015-01-19 ENCOUNTER — Other Ambulatory Visit: Payer: Self-pay | Admitting: Physician Assistant

## 2015-01-19 ENCOUNTER — Encounter: Payer: Self-pay | Admitting: Cardiovascular Disease

## 2015-01-19 MED ORDER — METOPROLOL TARTRATE 25 MG PO TABS: 12.5000 mg | ORAL_TABLET | Freq: Two times a day (BID) | ORAL | Status: DC

## 2015-01-20 ENCOUNTER — Encounter: Payer: Self-pay | Admitting: Cardiovascular Disease

## 2015-01-23 ENCOUNTER — Ambulatory Visit (INDEPENDENT_AMBULATORY_CARE_PROVIDER_SITE_OTHER): Payer: BLUE CROSS/BLUE SHIELD | Admitting: Family Medicine

## 2015-01-23 ENCOUNTER — Encounter: Payer: Self-pay | Admitting: Family Medicine

## 2015-01-23 VITALS — BP 114/84 | HR 77 | Temp 97.8°F | Ht 70.0 in | Wt 205.8 lb

## 2015-01-23 DIAGNOSIS — Z23 Encounter for immunization: Secondary | ICD-10-CM | POA: Diagnosis not present

## 2015-01-23 DIAGNOSIS — Z0000 Encounter for general adult medical examination without abnormal findings: Secondary | ICD-10-CM | POA: Diagnosis not present

## 2015-01-23 NOTE — Progress Notes (Signed)
49 year old male presents for annual wellness exam and preventative care.    In 2015 Underwent mitral valve repair for mitral valve prolapse with a large flail segment of the posterior leaflet and stage C chronic primary mitral regurgitation. Last follow up with Dr. Cornelius Moras was on 10/27/2014 Seeing Dr. Excell Seltzer  Cardiology. [facility] 05/2104 ECHO: Normal LV function; grade 1 diastolic dysfunction; s/p MV repair with mild MS; no MR.  Nonischemic cardiomyopathy: On lisinopril, metoprolol. BP Readings from Last 3 Encounters:  01/23/15 114/84  10/27/14 131/79  06/05/14 114/78   Working on healthy eating and regular exercise. He has lost weight but has gained some back. Wt Readings from Last 3 Encounters:  01/23/15 205 lb 12 oz (93.328 kg)  10/27/14 195 lb (88.451 kg)  06/05/14 202 lb 12.8 oz (91.989 kg)   Elevated Cholesterol:  On atorvastatin. Chol 162, tri 155, hdl 42, ldl 89 Walking a lot, occ skips lunch,  moderate diet  Other complaints: No CP, no SOB.   LFT: nml,  Glucose 98  Gout: uric acid: 6.3  On allopurinol 100 mg daily No attacks in last year. No clear triggers. Limited alcohol.   GERD: Well controlled on no med  Prediabetes: improved from 12/2014   Review of Systems  Constitutional: Negative for fever, fatigue and unexpected weight change.  HENT: Negative for ear pain, congestion, sore throat, rhinorrhea, trouble swallowing and postnasal drip.  Eyes: Negative for pain.  Respiratory: Negative for cough, shortness of breath and wheezing.  Cardiovascular: Negative for chest pain, palpitations and leg swelling.  Gastrointestinal: Negative for nausea, abdominal pain, diarrhea, constipation and blood in stool.  Genitourinary: Negative for dysuria, urgency, hematuria, discharge, penile swelling, scrotal swelling, difficulty urinating, penile pain and testicular pain.  Skin: Negative for rash.  Neurological: Negative for syncope, weakness, light-headedness, numbness and  headaches.  Psychiatric/Behavioral: Negative for behavioral problems and dysphoric mood. The patient is not nervous/anxious.  Objective:   Physical Exam  Constitutional: He appears well-developed and well-nourished. Non-toxic appearance. He does not appear ill. No distress.  HENT:  Head: Normocephalic and atraumatic.  Right Ear: Hearing, tympanic membrane, external ear and ear canal normal.  Left Ear: Hearing, tympanic membrane, external ear and ear canal normal.  Nose: Nose normal.  Mouth/Throat: Uvula is midline, oropharynx is clear and moist and mucous membranes are normal.  Eyes: Conjunctivae, EOM and lids are normal. Pupils are equal, round, and reactive to light. No foreign bodies found.  Neck: Trachea normal, normal range of motion and phonation normal. Neck supple. Carotid bruit is not present. No mass and no thyromegaly present.  Cardiovascular: Normal rate, regular rhythm, S1 normal, S2 normal, intact distal pulses and normal pulses. Exam reveals no gallop.  No murmur heard.  Pulmonary/Chest: Breath sounds normal. He has no wheezes. He has no rhonchi. He has no rales.  Abdominal: Soft. Normal appearance and bowel sounds are normal. There is no hepatosplenomegaly. There is no tenderness. There is no rebound, no guarding and no CVA tenderness. No hernia. Hernia confirmed negative in the right inguinal area and confirmed negative in the left inguinal area.  Genitourinary: Testes normal and penis normal. Right testis shows no mass and no tenderness. Left testis shows no mass and no tenderness. No paraphimosis or penile tenderness.  Lymphadenopathy:  He has no cervical adenopathy.  Right: No inguinal adenopathy present.  Left: No inguinal adenopathy present.  Neurological: He is alert. He has normal strength and normal reflexes. No cranial nerve deficit or sensory deficit.  Gait normal.  Skin: Skin is warm, dry and intact. No rash noted.  Psychiatric: He has a normal mood  and affect. His speech is normal and behavior is normal. Judgment normal.  Assessment & Plan:   Complet physcial Exam: The patient's preventative maintenance and recommended screening tests for an annual wellness exam were reviewed in full today.  Brought up to date unless services declined.  Counselled on the importance of diet, exercise, and its role in overall health and mortality.  The patient's FH and SH was reviewed, including their home life, tobacco status, and drug and alcohol status.   Tdap and Flu vaccine  given Nonsmoker. No early family history of prostate or colon cancer.      Refused HIV screen.

## 2015-01-23 NOTE — Progress Notes (Signed)
Pre visit review using our clinic review tool, if applicable. No additional management support is needed unless otherwise documented below in the visit note. 

## 2015-01-23 NOTE — Patient Instructions (Signed)
Keep working on healthy eating, regular exercise and weight loss.   

## 2015-01-23 NOTE — Addendum Note (Signed)
Addended by: Damita Lack on: 01/23/2015 09:35 AM   Modules accepted: Orders

## 2015-02-28 ENCOUNTER — Other Ambulatory Visit: Payer: Self-pay | Admitting: Cardiovascular Disease

## 2015-03-04 ENCOUNTER — Encounter: Payer: Self-pay | Admitting: Cardiovascular Disease

## 2015-03-05 ENCOUNTER — Other Ambulatory Visit: Payer: Self-pay

## 2015-03-05 MED ORDER — LISINOPRIL 2.5 MG PO TABS: ORAL_TABLET | ORAL | Status: DC

## 2015-04-30 ENCOUNTER — Other Ambulatory Visit: Payer: Self-pay | Admitting: Cardiovascular Disease

## 2015-05-04 ENCOUNTER — Other Ambulatory Visit: Payer: Self-pay | Admitting: Cardiovascular Disease

## 2015-05-26 ENCOUNTER — Encounter: Payer: Self-pay | Admitting: Cardiovascular Disease

## 2015-05-26 ENCOUNTER — Telehealth: Payer: Self-pay | Admitting: Cardiovascular Disease

## 2015-05-26 NOTE — Telephone Encounter (Signed)
Message sent to the pt through My Chart.  

## 2015-05-26 NOTE — Telephone Encounter (Signed)
New Message  Pt sent message to scheduling for follow up appt- sched w/ Scott 06/08/15. Pt also stated to speak w/ RN concerning his medication- pt stated: I took my medicine yesterday am, however not last night and will not be able to take my medication until tomorrow evening.  Would it be alright if I miss taking the medication today?   Please advise.

## 2015-05-30 ENCOUNTER — Other Ambulatory Visit: Payer: Self-pay | Admitting: Family Medicine

## 2015-06-04 ENCOUNTER — Encounter: Payer: Self-pay | Admitting: Family Medicine

## 2015-06-04 ENCOUNTER — Ambulatory Visit (INDEPENDENT_AMBULATORY_CARE_PROVIDER_SITE_OTHER): Payer: BLUE CROSS/BLUE SHIELD | Admitting: Family Medicine

## 2015-06-04 ENCOUNTER — Telehealth: Payer: Self-pay | Admitting: Family Medicine

## 2015-06-04 ENCOUNTER — Ambulatory Visit (HOSPITAL_BASED_OUTPATIENT_CLINIC_OR_DEPARTMENT_OTHER)
Admission: RE | Admit: 2015-06-04 | Discharge: 2015-06-04 | Disposition: A | Discharge status: Home / Self Care | Payer: BLUE CROSS/BLUE SHIELD | Source: Ambulatory Visit | Attending: Family Medicine | Admitting: Family Medicine

## 2015-06-04 VITALS — BP 136/88 | HR 86 | Ht 70.0 in | Wt 199.0 lb

## 2015-06-04 VITALS — BP 124/80 | HR 87 | Temp 98.2°F | Resp 16 | Wt 199.0 lb

## 2015-06-04 DIAGNOSIS — S6992XA Unspecified injury of left wrist, hand and finger(s), initial encounter: Secondary | ICD-10-CM | POA: Diagnosis present

## 2015-06-04 DIAGNOSIS — S62303A Unspecified fracture of third metacarpal bone, left hand, initial encounter for closed fracture: Secondary | ICD-10-CM | POA: Diagnosis not present

## 2015-06-04 DIAGNOSIS — X58XXXA Exposure to other specified factors, initial encounter: Secondary | ICD-10-CM | POA: Insufficient documentation

## 2015-06-04 MED ORDER — MELOXICAM 15 MG PO TABS: 15.0000 mg | ORAL_TABLET | Freq: Every day | ORAL | Status: DC

## 2015-06-04 NOTE — Telephone Encounter (Signed)
Patient Name: Lawrence Underwood DOB: 12/26/1955 Initial Comment caller states he broke his finger last night - it is very swollen Nurse Assessment Guidelines Guideline Title Affirmed Question Affirmed Notes Final Disposition User FINAL ATTEMPT MADE - no message left Yetta Barre, Charity fundraiser, [employer]

## 2015-06-04 NOTE — Telephone Encounter (Signed)
Pt has appt scheduled with Dr. Beverely Low today at 2 pm.

## 2015-06-04 NOTE — Patient Instructions (Signed)
Follow up as needed Go downstairs and get your hand xrays Start the mobic once daily- take w/ food.  Avoid other anti-inflammatories (Advil, Aleve, Excedrin) while taking the Mobic ICE!! We'll notify you of your results and determine the next steps Call with any questions or concerns Hang in there!!!

## 2015-06-04 NOTE — Progress Notes (Signed)
Pre visit review using our clinic review tool, if applicable. No additional management support is needed unless otherwise documented below in the visit note. 

## 2015-06-04 NOTE — Assessment & Plan Note (Signed)
New.  Suspect fracture of L middle finger or metacarpal but the question will be whether it is angulated or involves the joint- which may require a hand surgeon.  Will get xray and if able, will tx fx here in office.  If not, will send pt upstairs to sports med (if possible) to stabilize fx and decide whether hand surgeon is required.  Pt expressed understanding and is in agreement w/ plan.

## 2015-06-04 NOTE — Telephone Encounter (Signed)
Reasonable disposition. Unfortunately, [facility] has multiple MD's sick and on vacation today. Appreciate help.
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
## 2015-06-05 ENCOUNTER — Telehealth: Payer: Self-pay | Admitting: Family Medicine

## 2015-06-05 NOTE — Telephone Encounter (Signed)
i faxed the written results over, I cannot fax the images.

## 2015-06-05 NOTE — Telephone Encounter (Signed)
Pt requesting imaging results from yesterday be sent to Dr. Betha Loa. Pt has appt today at 10:15am.

## 2015-06-08 ENCOUNTER — Ambulatory Visit: Payer: BLUE CROSS/BLUE SHIELD | Admitting: Physician Assistant

## 2015-06-10 NOTE — Assessment & Plan Note (Signed)
Left 3rd proximal phalanx fracture -  Independently reviewed radiographs - fracture is intraarticular and comminuted with shortening.  Placed in radial gutter splint to stabilize.  Appointment made tomorrow with hand surgery to discuss probable ORIF.  Tylenol, ibuprofen if needed.
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
## 2015-06-22 DIAGNOSIS — S62613D Displaced fracture of proximal phalanx of left middle finger, subsequent encounter for fracture with routine healing: Secondary | ICD-10-CM | POA: Diagnosis not present

## 2015-07-07 DIAGNOSIS — S62613D Displaced fracture of proximal phalanx of left middle finger, subsequent encounter for fracture with routine healing: Secondary | ICD-10-CM | POA: Diagnosis not present

## 2015-07-10 ENCOUNTER — Encounter: Payer: Self-pay | Admitting: Physician Assistant

## 2015-07-10 ENCOUNTER — Ambulatory Visit (INDEPENDENT_AMBULATORY_CARE_PROVIDER_SITE_OTHER): Payer: BLUE CROSS/BLUE SHIELD | Admitting: Physician Assistant

## 2015-07-10 VITALS — BP 102/70 | HR 74 | Ht 70.0 in | Wt 197.8 lb

## 2015-07-10 DIAGNOSIS — Z9889 Other specified postprocedural states: Secondary | ICD-10-CM | POA: Diagnosis not present

## 2015-07-10 DIAGNOSIS — I429 Cardiomyopathy, unspecified: Secondary | ICD-10-CM

## 2015-07-10 DIAGNOSIS — E782 Mixed hyperlipidemia: Secondary | ICD-10-CM

## 2015-07-10 DIAGNOSIS — I2510 Atherosclerotic heart disease of native coronary artery without angina pectoris: Secondary | ICD-10-CM

## 2015-07-10 DIAGNOSIS — I428 Other cardiomyopathies: Secondary | ICD-10-CM

## 2015-07-10 NOTE — Patient Instructions (Addendum)
Medication Instructions:  No changes.  See your medication list. Labwork: None today.  Send [place] a copy of your labs from work when you can. Testing/Procedures: None  Follow-Up: Dr. Tonny Bollman in 1 year.  You will be contacted a few months before to schedule this appointment.  Any Other Special Instructions Will Be Listed Below (If Applicable). If you need a refill on your cardiac medications before your next appointment, please call your pharmacy.

## 2015-07-10 NOTE — Progress Notes (Signed)
Cardiology Office Note:    Date:  07/10/2015   ID:  Lawrence Underwood, DOB May 31, 1965, MRN [id number]  PCP:  Lawrence Nora, MD  Cardiologist:  Dr. Tonny Bollman   Electrophysiologist:  n/a  Referring MD: Lawrence Seltzer, MD   Chief Complaint  Patient presents with  . Mitral valve disease s/p MV  repair    Follow-up    History of Present Illness:     Lawrence Underwood is a 50 y.o. male with a hx of Mitral valve disease with mitral valve prolapse and mitral regurgitation status post minimally invasive mitral valve repair in 7/15, HL, nonobstructive CAD. Initial postoperative echocardiogram demonstrated mild LV dysfunction. Follow-up echocardiogram demonstrated normal LV function and stable mitral valve repair. Last seen by Dr. Excell Underwood 3/16.  Returns for FU.  The patient denies chest pain, shortness of breath, syncope, orthopnea, PND or significant pedal edema.    Past Medical History  Diagnosis Date  . Gout     but doesn't take any meds for this  . Mitral regurgitation due to cusp prolapse 08/26/2013  . Coronary artery disease 09/18/2013    40% stenosis RCA and otherwise non-obstructive CAD   . History of bronchitis 2 yrs ago  . Dizziness     r/t mitral valve  . Hyperlipidemia   . H/O hiatal hernia   . GERD (gastroesophageal reflux disease)     no meds  . 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    Complex valvuloplasty including triangular resection of flail posterior leaflet, artificial Gore-tex neocord placement x4, Sorin Memo 3D ring annuloplasty (size 34 mm) via right mini thoracotomy approach    Past Surgical History  Procedure Laterality Date  . Hand surgery      R hand  . Knee surgery Left     arthroscopy  . Tee without cardioversion N/A 08/26/2013    Procedure: TRANSESOPHAGEAL ECHOCARDIOGRAM (TEE);  Surgeon: Lawrence Masson, MD;  Location: [facility] ENDOSCOPY;  Service: Cardiovascular;  Laterality: N/A;  . Wisdom tooth extraction    . Cardiac catheterization  09/18/13    . Mitral valve repair Right 10/11/2013    Procedure: MINIMALLY INVASIVE MITRAL VALVE REPAIR (MVR);  Surgeon: Lawrence Nails, MD;  Location: [facility] OR;  Service: Open Heart Surgery;  Laterality: Right;  . Intraoperative transesophageal echocardiogram N/A 10/11/2013    Procedure: INTRAOPERATIVE TRANSESOPHAGEAL ECHOCARDIOGRAM;  Surgeon: Lawrence Nails, MD;  [facility];  Service: Open Heart Surgery;  Laterality: N/A;  . Left and right heart catheterization with coronary angiogram N/A 09/18/2013    Procedure: LEFT AND RIGHT HEART CATHETERIZATION WITH CORONARY ANGIOGRAM;  Surgeon: Lawrence Chapman, MD;  Location: [facility] CATH LAB;  Service: Cardiovascular;  Laterality: N/A;    Current Medications: Outpatient Prescriptions Prior to Visit  Medication Sig Dispense Refill  . allopurinol (ZYLOPRIM) 100 MG tablet TAKE 1 TABLET (100 MG TOTAL) BY MOUTH DAILY. 30 tablet 2  . aspirin EC 81 MG tablet Take 1 tablet (81 mg total) by mouth daily.    Marland Kitchen atorvastatin (LIPITOR) 10 MG tablet TAKE 1 TABLET (10 MG TOTAL) BY MOUTH DAILY. 30 tablet 2  . lisinopril (PRINIVIL,ZESTRIL) 2.5 MG tablet TAKE 1 TABLET (2.5 MG TOTAL) BY MOUTH DAILY. 30 tablet 11  . metoprolol tartrate (LOPRESSOR) 25 MG tablet Take 0.5 tablets (12.5 mg total) by mouth 2 (two) times daily. 90 tablet 3  . meloxicam (MOBIC) 15 MG tablet Take 1 tablet (15 mg total) by mouth daily. (Patient not taking: Reported on 07/10/2015) 30  tablet 1   No facility-administered medications prior to visit.     Allergies:   Lisinopril   Social History   Social History  . Marital Status: Married    Spouse Name: N/A  . Number of Children: N/A  . Years of Education: N/A   Occupational History  . [employer]    Social History Main Topics  . Smoking status: Never Smoker   . Smokeless tobacco: Never Used  . Alcohol Use: 0.0 oz/week    0 Standard drinks or equivalent per week     Comment: weekends  . Drug Use: No  . Sexual Activity: Yes   Other Topics Concern  .  None   Social History Narrative     Family History:  The patient's family history includes CVA (age of onset: 94) in his father; Gout in his father, maternal grandfather, maternal grandmother, paternal grandfather, and paternal grandmother; Heart attack (age of onset: 42) in his father; Hyperlipidemia in his mother.   ROS:   Please see the history of present illness.    ROS All other systems reviewed and are negative.   Physical Exam:    VS:  BP 102/70 mmHg  Pulse 74  Ht  (1.778 m)  Wt 197 lb 12.8 oz (89.721 kg)  BMI 28.38 kg/m2   GEN: Well nourished, well developed, in no acute distress HEENT: normal Neck: no JVD, no masses Cardiac: Normal S1/S2, RRR; no murmurs, rubs, or gallops, no edema;     Respiratory:  clear to auscultation bilaterally; no wheezing, rhonchi or rales GI: soft, nontender, nondistended MS: no deformity or atrophy Skin: warm and dry Neuro: No focal deficits  Psych: Alert and oriented x 3, normal affect  Wt Readings from Last 3 Encounters:  07/10/15 197 lb 12.8 oz (89.721 kg)  06/04/15 199 lb (90.266 kg)  06/04/15 199 lb (90.266 kg)      Studies/Labs Reviewed:     EKG:  EKG is  ordered today.  The ekg ordered today demonstrates NSR, HR 71, LAD, QTc 436 ms, no changes.   Recent Labs: No results found for requested labs within last 365 days.   Recent Lipid Panel    Component Value Date/Time   CHOL 261 02/23/2007 0908   TRIG 280 02/23/2007 0908   HDL 38 02/23/2007 0908   LDLCALC 167 02/23/2007 0908    Additional studies/ records that were reviewed today include:    Echo 3/16 Mild LVH, EF 50-55%, normal wall motion, grade 1 diastolic dysfunction, MV repair with mild MS (mean 6 mmHg) and no MR  Carotid US 7/15 Bilateral ICA 1-39%  LHC 7/15 LM patent LAD patent LCx patent RCA mid 40% Vigorous LVEF, EF 65%, severe mitral regurgitation   ASSESSMENT:     1. S/P minimally invasive mitral valve repair   2. Coronary artery disease  involving native coronary artery of native heart without angina pectoris   3. HYPERLIPIDEMIA   4. NICM (nonischemic cardiomyopathy) (HCC)     PLAN:     In order of problems listed above:  1. S/p MV Repair -  Echo in 3/16 with normal EF and stable MV repair.  He is doing well without symptoms to suggest changes.   -  Continue SBE prophylaxis  2. CAD - No obs CAD at [facility] prior to MV surgery.  No angina.   -  Continue ASA, statin.  3. HL - Continue statin. Will request recent labs.   4. NICM - EF recovered  to normal post MV repair.  Continue ACE inhibitor, beta-blocker.    Medication Adjustments/Labs and Tests Ordered: Current medicines are reviewed at length with the patient today.  Concerns regarding medicines are outlined above.  Medication changes, Labs and Tests ordered today are outlined in the Patient Instructions noted below. Patient Instructions  Medication Instructions:  No changes.  See your medication list. Labwork: None today.  Send [place] a copy of your labs from work when you can. Testing/Procedures: None  Follow-Up: Dr. Tonny Bollman in 1 year.  You will be contacted a few months before to schedule this appointment.  Any Other Special Instructions Will Be Listed Below (If Applicable). If you need a refill on your cardiac medications before your next appointment, please call your pharmacy.    Signed, Tereso Newcomer, PA-C  07/10/2015 9:27 AM    [facility] [address] [place], [place], [place]  [address] Phone: [phone]; Fax: [phone]

## 2015-07-24 DIAGNOSIS — S62613D Displaced fracture of proximal phalanx of left middle finger, subsequent encounter for fracture with routine healing: Secondary | ICD-10-CM | POA: Diagnosis not present

## 2015-07-24 DIAGNOSIS — M79642 Pain in left hand: Secondary | ICD-10-CM | POA: Diagnosis not present

## 2015-07-29 DIAGNOSIS — S62613D Displaced fracture of proximal phalanx of left middle finger, subsequent encounter for fracture with routine healing: Secondary | ICD-10-CM | POA: Diagnosis not present

## 2015-07-30 ENCOUNTER — Other Ambulatory Visit: Payer: Self-pay | Admitting: Cardiovascular Disease

## 2015-08-06 DIAGNOSIS — S62613D Displaced fracture of proximal phalanx of left middle finger, subsequent encounter for fracture with routine healing: Secondary | ICD-10-CM | POA: Diagnosis not present

## 2015-09-05 ENCOUNTER — Other Ambulatory Visit: Payer: Self-pay | Admitting: Family Medicine

## 2015-09-28 ENCOUNTER — Ambulatory Visit (INDEPENDENT_AMBULATORY_CARE_PROVIDER_SITE_OTHER): Payer: BLUE CROSS/BLUE SHIELD | Admitting: Family Medicine

## 2015-09-28 ENCOUNTER — Encounter: Payer: Self-pay | Admitting: Family Medicine

## 2015-09-28 VITALS — BP 112/70 | HR 84 | Temp 98.7°F | Ht 70.0 in | Wt 200.0 lb

## 2015-09-28 DIAGNOSIS — R51 Headache: Secondary | ICD-10-CM

## 2015-09-28 DIAGNOSIS — R519 Headache, unspecified: Secondary | ICD-10-CM

## 2015-09-28 DIAGNOSIS — R42 Dizziness and giddiness: Secondary | ICD-10-CM | POA: Diagnosis not present

## 2015-09-28 NOTE — Progress Notes (Signed)
Pre visit review using our clinic review tool, if applicable. No additional management support is needed unless otherwise documented below in the visit note. 

## 2015-09-28 NOTE — Patient Instructions (Signed)
Send me a message if you would like to have an MRI- we can arrange this for you if needed Take care!

## 2015-09-28 NOTE — Progress Notes (Signed)
Healthcare at [facility] [address] [place], [place] [address] [phone]  Date:  09/28/2015   Name:  Lawrence Underwood   DOB:  1965/12/21   MRN:  [id number]  PCP:  [name], MD    Chief Complaint: Headache   History of Present Illness:  Lawrence Underwood is a 50 y.o. very pleasant male patient who presents with the following:  Here today with concern of headaches and some unusual sensations over the last 6 months.   He is generally in good health and "normally I don't go to the doctor."   He has noted an occasional "weakness in my muscles," this is most noticeable in his right arm. It is not a numbness or lack of ability to move the arm but more of a sensation of it being not as strong. He has noted this for about 6 months, and it may occur 1-2x a month.   The sx may last 1-2 minutes and then resolve totally.   In April he was driving and had a sudden dizzy spell- he had to pull over.  This occurred again over the weekend.  He felt like the room was spinning He has noted some "cloudy feeling and nausea" also from time to time.   No episodes where he cannot control his body, no slurred speech No vomiting.   He will notice mild headaches   Never had this is the past   He has mild CAD seen on a cath prior to having his mitral valve repair in 2015- he stated on lipitor but no other tx needed No chest pain.   He enjoys walking for exercise, he does not have sx with exercise.    He gets labs drawn at work annually- he has a thorough annual lab panel in the fall   He will bring in a copy for [place] to review  BP Readings from Last 3 Encounters:  09/28/15 112/70  07/10/15 102/70  06/04/15 124/80     Patient Active Problem List   Diagnosis Date Noted  . Injury of left hand including fingers 06/04/2015  . 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  . Coronary artery disease 09/18/2013  . Internal hemorrhoid, bleeding  08/16/2013  . Prediabetes 04/09/2013  . GOUT, UNSPECIFIED 11/22/2008  . HYPERLIPIDEMIA 02/23/2007  . GERD 02/23/2007    Past Medical History  Diagnosis Date  . Gout     but doesn't take any meds for this  . Mitral regurgitation due to cusp prolapse 08/26/2013  . Coronary artery disease 09/18/2013    40% stenosis RCA and otherwise non-obstructive CAD   . History of bronchitis 2 yrs ago  . Dizziness     r/t mitral valve  . Hyperlipidemia   . H/O hiatal hernia   . GERD (gastroesophageal reflux disease)     no meds  . 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    Complex valvuloplasty including triangular resection of flail posterior leaflet, artificial Gore-tex neocord placement x4, Sorin Memo 3D ring annuloplasty (size 34 mm) via right mini thoracotomy approach    Past Surgical History  Procedure Laterality Date  . Hand surgery      R hand  . Knee surgery Left     arthroscopy  . Tee without cardioversion N/A 08/26/2013    Procedure: TRANSESOPHAGEAL ECHOCARDIOGRAM (TEE);  Surgeon: [name], MD;  Location: [facility] ENDOSCOPY;  Service: Cardiovascular;  Laterality: N/A;  . Wisdom  tooth extraction    . Cardiac catheterization  09/18/13  . Mitral valve repair Right 10/11/2013    Procedure: MINIMALLY INVASIVE MITRAL VALVE REPAIR (MVR);  Surgeon: Purcell Nails, MD;  Location: [facility] OR;  Service: Open Heart Surgery;  Laterality: Right;  . Intraoperative transesophageal echocardiogram N/A 10/11/2013    Procedure: INTRAOPERATIVE TRANSESOPHAGEAL ECHOCARDIOGRAM;  Surgeon: Purcell Nails, MD;  Location: [facility] OR;  Service: Open Heart Surgery;  Laterality: N/A;  . Left and right heart catheterization with coronary angiogram N/A 09/18/2013    Procedure: LEFT AND RIGHT HEART CATHETERIZATION WITH CORONARY ANGIOGRAM;  Surgeon: Micheline Chapman, MD;  Location: [facility] CATH LAB;  Service: Cardiovascular;  Laterality: N/A;    Social History  Substance Use Topics  . Smoking status: Never Smoker   .  Smokeless tobacco: Never Used  . Alcohol Use: 0.0 oz/week    0 Standard drinks or equivalent per week     Comment: weekends    Family History  Problem Relation Age of Onset  . Hyperlipidemia Mother   . Heart attack Father 43  . CVA Father 24  . Gout Father   . Gout Maternal Grandmother   . Gout Maternal Grandfather   . Gout Paternal Grandmother   . Gout Paternal Grandfather     Allergies  Allergen Reactions  . Lisinopril Cough    Medication list has been reviewed and updated.  Current Outpatient Prescriptions on File Prior to Visit  Medication Sig Dispense Refill  . allopurinol (ZYLOPRIM) 100 MG tablet TAKE 1 TABLET (100 MG TOTAL) BY MOUTH DAILY. 30 tablet 2  . aspirin EC 81 MG tablet Take 1 tablet (81 mg total) by mouth daily.    Marland Kitchen atorvastatin (LIPITOR) 10 MG tablet TAKE 1 TABLET (10 MG TOTAL) BY MOUTH DAILY. 30 tablet 10  . lisinopril (PRINIVIL,ZESTRIL) 2.5 MG tablet TAKE 1 TABLET (2.5 MG TOTAL) BY MOUTH DAILY. 30 tablet 11  . metoprolol tartrate (LOPRESSOR) 25 MG tablet Take 0.5 tablets (12.5 mg total) by mouth 2 (two) times daily. 90 tablet 3   No current facility-administered medications on file prior to visit.    Review of Systems:  As per HPI- otherwise negative.   Physical Examination: Filed Vitals:   09/28/15 1608  BP: 112/70  Pulse: 84  Temp: 98.7 F (37.1 C)   Filed Vitals:   09/28/15 1608  Height: 5\' 10"  (1.778 m)  Weight: 200 lb (90.719 kg)   Body mass index is 28.7 kg/(m^2). Ideal Body Weight: Weight in (lb) to have BMI = 25: 173.9  GEN: WDWN, NAD, Non-toxic, A & O x 3, looks well HEENT: Atraumatic, Normocephalic. Neck supple. No masses, No LAD.  Bilateral TM wnl, oropharynx normal.  PEERL,EOMI.   Ears and Nose: No external deformity. CV: RRR, No M/G/R. No JVD. No thrill. No extra heart sounds. PULM: CTA B, no wheezes, crackles, rhonchi. No retractions. No resp. distress. No accessory muscle use. ABD: S, NT, ND, +BS. No rebound. No  HSM. EXTR: No c/c/e. Normal strength and DTR of all extremities, normal romberg  NEURO Normal gait.  PSYCH: Normally interactive. Conversant. Not depressed or anxious appearing.  Calm demeanor.   BP Readings from Last 3 Encounters:  09/28/15 112/70  07/10/15 102/70  06/04/15 124/80    Assessment and Plan: Dizzy spells  Frequent headaches  Here today with symptoms as above.  Counseled him that these sx may be significant but most likely we will not find anything amiss on work up.  However I  am happy to do an MRI of his head and/ or refer to neurology. He will also bring me a copy of his recent labs.  For the time being he wishes to consider these options and will let me know if he would like to look further.  He will observe for persistent or worsening symptoms   Signed Abbe Amsterdam, MD

## 2015-10-08 ENCOUNTER — Telehealth: Payer: Self-pay | Admitting: Emergency Medicine

## 2015-10-08 ENCOUNTER — Encounter: Payer: Self-pay | Admitting: Family Medicine

## 2015-10-08 DIAGNOSIS — R2990 Unspecified symptoms and signs involving the nervous system: Secondary | ICD-10-CM

## 2015-10-08 DIAGNOSIS — R51 Headache: Principal | ICD-10-CM

## 2015-10-08 DIAGNOSIS — R519 Headache, unspecified: Secondary | ICD-10-CM

## 2015-10-08 DIAGNOSIS — R42 Dizziness and giddiness: Secondary | ICD-10-CM

## 2015-10-09 ENCOUNTER — Telehealth: Payer: Self-pay | Admitting: Cardiovascular Disease

## 2015-10-09 ENCOUNTER — Encounter: Payer: Self-pay | Admitting: Physician Assistant

## 2015-10-09 NOTE — Telephone Encounter (Signed)
Darius Bump RN at [facility] contacted Dr Cornelius Moras in regards to this pt needing an MRI of Brain and he said it was okay for pt to have this test performed. I contacted the pt and made him aware of this information.

## 2015-10-09 NOTE — Telephone Encounter (Signed)
New message    Request for surgical clearance:  What type of surgery is being performed? MRI 1. When is this surgery scheduled? 10-11-15---this sunday  Are there any medications that need to be held prior to surgery and how long? Pt want Dr Excell Seltzer to say it is ok to have an MRI.  The facility says they do not need clearance, however, the pt insist they call and ask Dr Excell Seltzer Name of physician performing surgery? Not sure 2. What is your office phone and fax number? Fax [phone] or send msg to Hanley Seamen in [place]

## 2015-10-11 ENCOUNTER — Ambulatory Visit (HOSPITAL_BASED_OUTPATIENT_CLINIC_OR_DEPARTMENT_OTHER)
Admission: RE | Admit: 2015-10-11 | Discharge: 2015-10-11 | Disposition: A | Discharge status: Home / Self Care | Payer: BLUE CROSS/BLUE SHIELD | Source: Ambulatory Visit | Attending: Family Medicine | Admitting: Family Medicine

## 2015-10-11 ENCOUNTER — Encounter: Payer: Self-pay | Admitting: Family Medicine

## 2015-10-11 DIAGNOSIS — R51 Headache: Secondary | ICD-10-CM | POA: Insufficient documentation

## 2015-10-11 DIAGNOSIS — R2990 Unspecified symptoms and signs involving the nervous system: Secondary | ICD-10-CM | POA: Diagnosis not present

## 2015-10-11 DIAGNOSIS — R42 Dizziness and giddiness: Secondary | ICD-10-CM | POA: Diagnosis not present

## 2015-10-11 DIAGNOSIS — R519 Headache, unspecified: Secondary | ICD-10-CM

## 2015-10-12 ENCOUNTER — Telehealth: Payer: Self-pay | Admitting: Emergency Medicine

## 2015-10-20 DIAGNOSIS — R0982 Postnasal drip: Secondary | ICD-10-CM | POA: Diagnosis not present

## 2015-10-20 DIAGNOSIS — R05 Cough: Secondary | ICD-10-CM | POA: Diagnosis not present

## 2015-10-20 NOTE — Telephone Encounter (Signed)
Routed to provider

## 2015-11-22 ENCOUNTER — Encounter: Payer: Self-pay | Admitting: Physician Assistant

## 2015-11-24 ENCOUNTER — Telehealth: Payer: Self-pay | Admitting: Physician Assistant

## 2015-11-24 DIAGNOSIS — E782 Mixed hyperlipidemia: Secondary | ICD-10-CM

## 2015-11-24 NOTE — Telephone Encounter (Signed)
Left message to call back  

## 2015-11-24 NOTE — Telephone Encounter (Signed)
Please call patient to schedule fasting lipid panel. Order placed in system. Tereso Newcomer, PA-C   11/24/2015 12:28 PM

## 2015-11-24 NOTE — Telephone Encounter (Signed)
Pt returned call.  Scheduled labs for 9/15 as this was earliest he could come in.  Pt verbalized understanding and was in agreement with this plan.

## 2015-12-04 ENCOUNTER — Telehealth: Payer: Self-pay | Admitting: *Deleted

## 2015-12-04 ENCOUNTER — Other Ambulatory Visit: Payer: BLUE CROSS/BLUE SHIELD | Admitting: *Deleted

## 2015-12-04 DIAGNOSIS — E782 Mixed hyperlipidemia: Secondary | ICD-10-CM

## 2015-12-04 LAB — LIPID PANEL
CHOLESTEROL: 198 mg/dL (ref 125–200)
HDL: 54 mg/dL (ref >=40)
LDL Cholesterol: 120 mg/dL (ref <130)
TRIGLYCERIDES: 121 mg/dL (ref <150)
Total CHOL/HDL Ratio: 3.7 Ratio (ref <=5.0)
VLDL: 24 mg/dL (ref <30)

## 2015-12-04 NOTE — Telephone Encounter (Signed)
Pt has been notified of lab results and findings today.  When I stated about increasing Lipitor pt states he has not been on Lipitor since about end of July per Dr. Excell Seltzer due to myalgias. Pt states he is not willing to go back on Lipitor and would rather try another medication of possible. I advised pt let me d/w Bing Neighbors. PA for further recommendations due to new information that he has not been on Lipitor about 6-7 weeks.

## 2015-12-08 ENCOUNTER — Telehealth: Payer: Self-pay | Admitting: *Deleted

## 2015-12-08 DIAGNOSIS — I2510 Atherosclerotic heart disease of native coronary artery without angina pectoris: Secondary | ICD-10-CM

## 2015-12-08 DIAGNOSIS — E782 Mixed hyperlipidemia: Secondary | ICD-10-CM

## 2015-12-08 MED ORDER — ROSUVASTATIN CALCIUM 5 MG PO TABS: 5.0000 mg | ORAL_TABLET | ORAL | 11 refills | Status: DC

## 2015-12-08 NOTE — Telephone Encounter (Signed)
I s/w pt today in reference to recommendations as to start Crestor 5 mg , take every Monday and Friday. Pt scheduled FLP/LFT to be done 03/07/16. He did also state that he will be having lab work the end of October at his job and will have results faxed to [place]. He states if the lab work in October is ok, then maybe he may not have to have the lab work in 03/07/16.

## 2016-01-07 ENCOUNTER — Other Ambulatory Visit: Payer: Self-pay | Admitting: Cardiovascular Disease

## 2016-01-10 DIAGNOSIS — Z23 Encounter for immunization: Secondary | ICD-10-CM | POA: Diagnosis not present

## 2016-01-11 ENCOUNTER — Other Ambulatory Visit: Payer: Self-pay | Admitting: Family Medicine

## 2016-01-13 ENCOUNTER — Encounter: Payer: Self-pay | Admitting: Physician Assistant

## 2016-01-13 ENCOUNTER — Other Ambulatory Visit: Payer: Self-pay | Admitting: *Deleted

## 2016-01-13 ENCOUNTER — Telehealth: Payer: Self-pay | Admitting: Cardiovascular Disease

## 2016-01-13 MED ORDER — LISINOPRIL 2.5 MG PO TABS: ORAL_TABLET | ORAL | 0 refills | Status: DC

## 2016-01-13 MED ORDER — METOPROLOL TARTRATE 25 MG PO TABS: 12.5000 mg | ORAL_TABLET | Freq: Two times a day (BID) | ORAL | 0 refills | Status: DC

## 2016-01-13 NOTE — Telephone Encounter (Signed)
Called pt and left message asking pt to call back to give the exact pharmacy in [place] to sent Rx to.

## 2016-01-13 NOTE — Telephone Encounter (Signed)
New Message   *STAT* If patient is at the pharmacy, call can be transferred to refill team.   1. Which medications need to be refilled? (please list name of each medication and dose if known) Metoprolol...... Lisinopril..... Pt stated he does not know, informing me I should know.  2. Which pharmacy/location (including street and city if local pharmacy) is medication to be sent to? Pt was impatient and I was unable to retrieve that information.  3. Do they need a 30 day or 90 day supply? Pt stated for the next few days   Comment: Pt call requesting to get medication filled. Pt states he is out of town and needs his medication fill. I ask pt name of medication and dosage as protocol, pt give me the names of meds and told me I should know the mg. I asked pt for a good call back number and pt asked why I needed a call back number. Pt stated he needed his medication and did not need a call back from me. I informed pt the nurse would be returning his call to make sure the meds would be available to him. Pt did not give location to where medication needed to be sent. Please call back to discuss

## 2016-01-13 NOTE — Telephone Encounter (Signed)
New message       *STAT* If patient is at the pharmacy, call can be transferred to refill team.   1. Which medications need to be refilled? (please list name of each medication and dose if known) metoprolol 25mg   2. Which pharmacy/location (including street and city if local pharmacy) is medication to be sent to? [facility] in [place] 3. Do they need a 30 day or 90 day supply? 30------pt is out of town and forgot his medication.

## 2016-01-23 ENCOUNTER — Other Ambulatory Visit: Payer: Self-pay | Admitting: Family Medicine

## 2016-01-29 IMAGING — CT CT CTA ABD/PEL W/CM AND/OR W/O CM
3 of 7 series · 12 of 36 positions shown, 18 images · IV contrast (80CC OMNI 350)
Comparison: None.

CLINICAL DATA: Mitral valve surgery. Evaluate aorto iliac vascular
anatomy.

EXAM:
CT ANGIOGRAPHY ABDOMEN AND PELVIS WITH CONTRAST AND WITHOUT CONTRAST
TECHNIQUE: Multidetector CT imaging of the abdomen and pelvis was performed
using the standard protocol during bolus administration of
intravenous contrast. Multiplanar reconstructed images and MIPs were
obtained and reviewed to evaluate the vascular anatomy.
CONTRAST:  80mL OMNIPAQUE IOHEXOL 350 MG/ML SOLN

[Series 4: angio · axial · 0.76mm/px · z∈[-367,+0]mm · 7 of 197 slices shown, 12 images]
[im 25/197  soft-tissue]
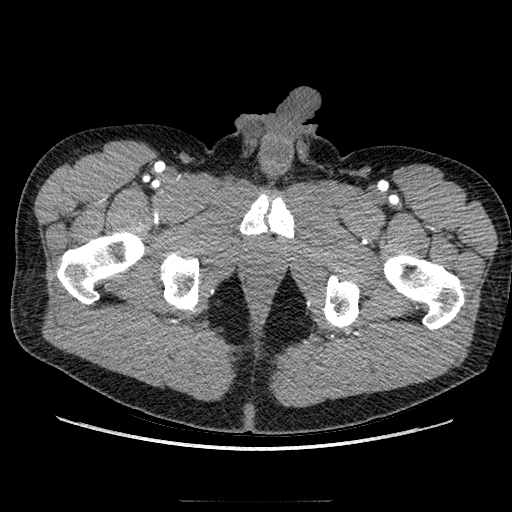
[im 25/197  bone]
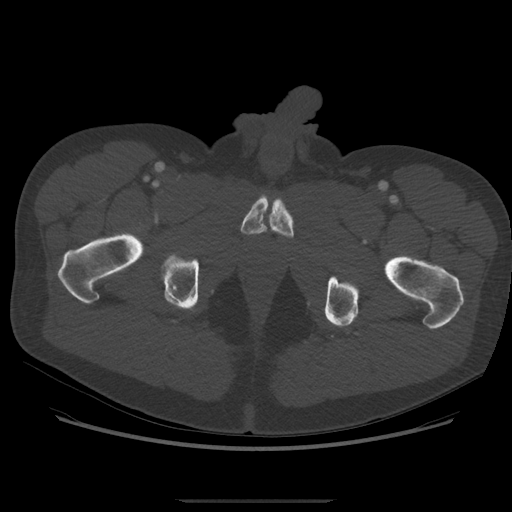
[im 50/197  soft-tissue]
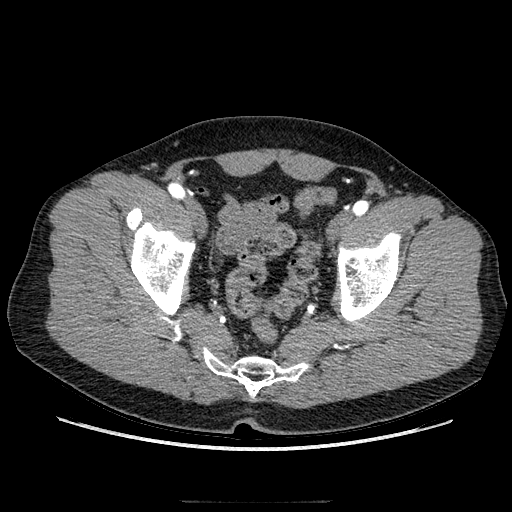
[im 74/197  soft-tissue]
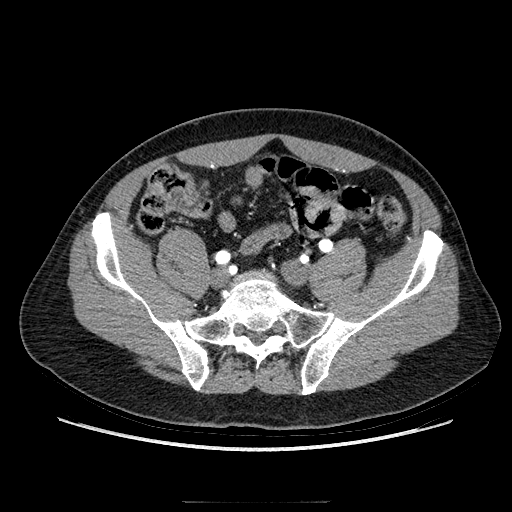
[im 99/197  soft-tissue]
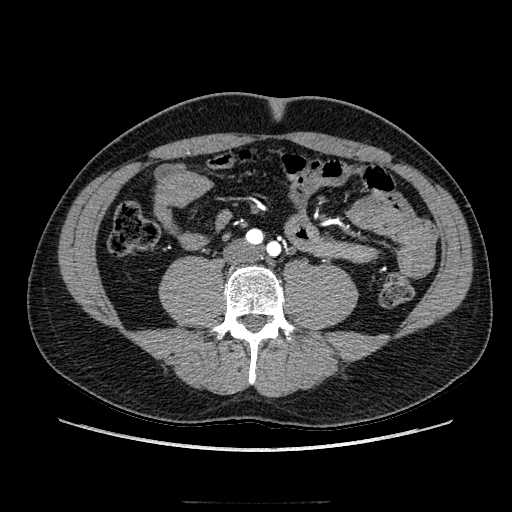
[im 99/197  lung]
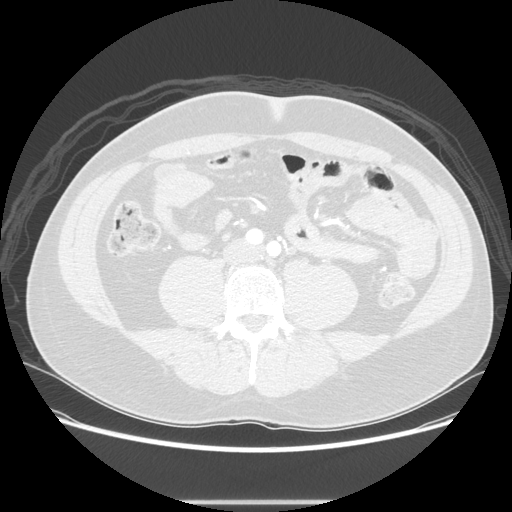
[im 123/197  soft-tissue]
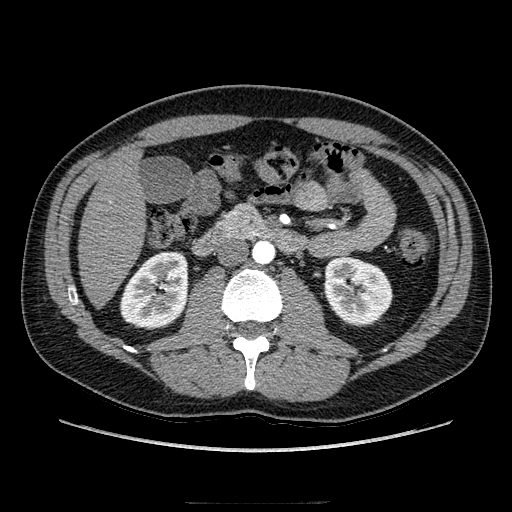
[im 123/197  lung]
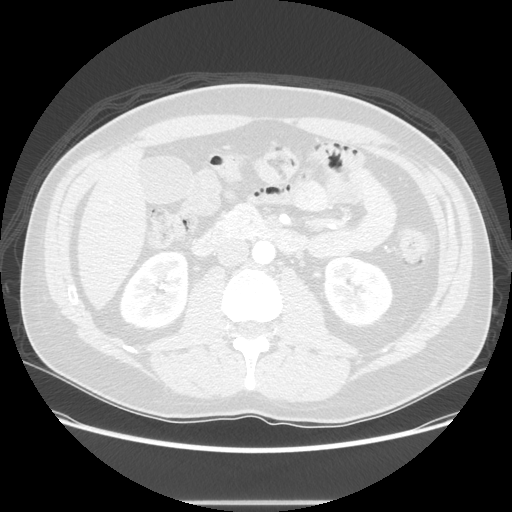
[im 148/197  soft-tissue]
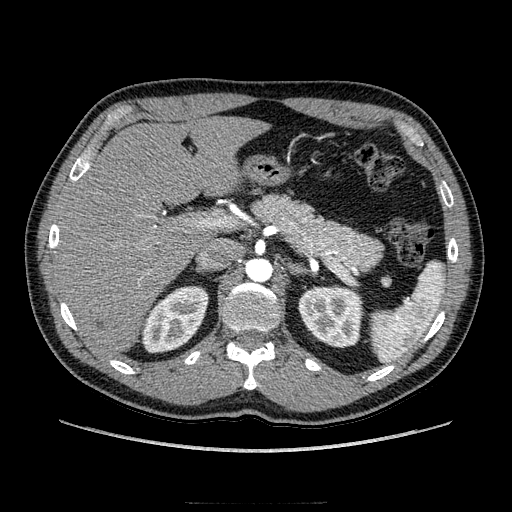
[im 148/197  lung]
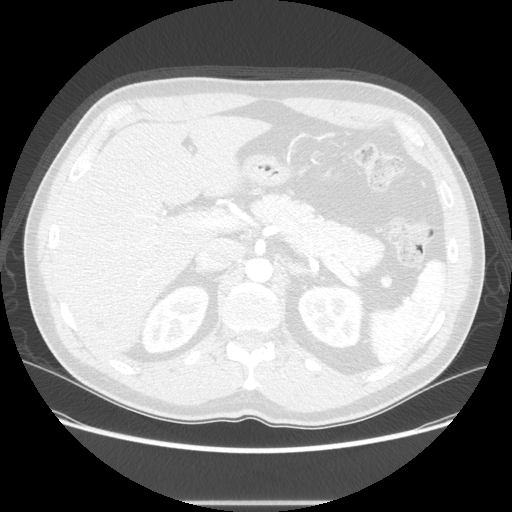
[im 172/197  soft-tissue]
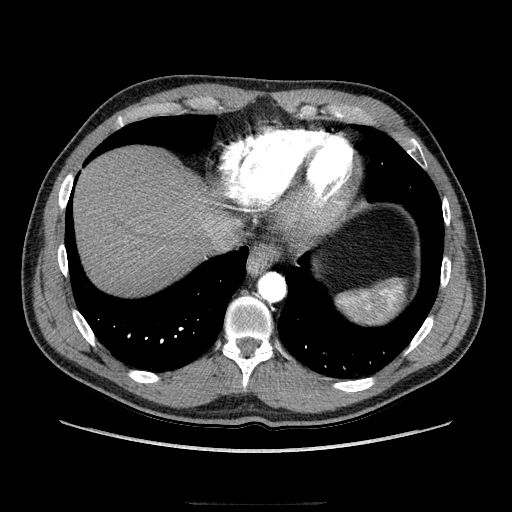
[im 172/197  lung]
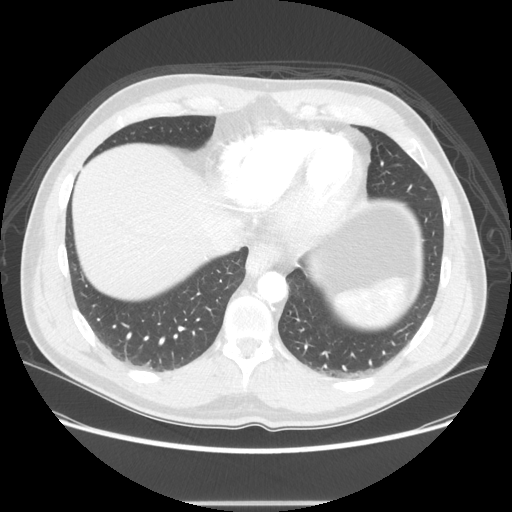

[Series 601: coronal body · coronal · 0.96mm/px · 1 of 113 slices shown, 2 images]
[im 38/113  soft-tissue]
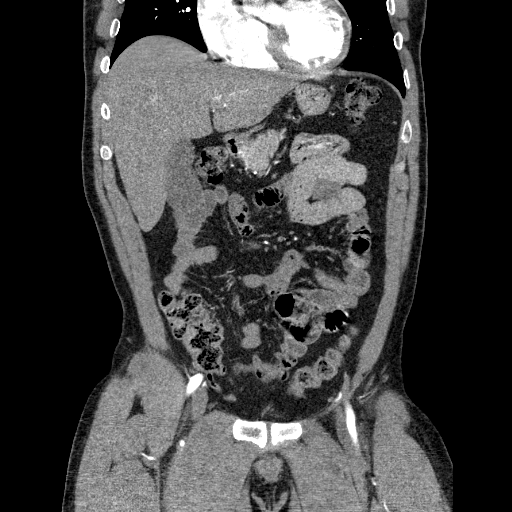
[im 38/113  bone]
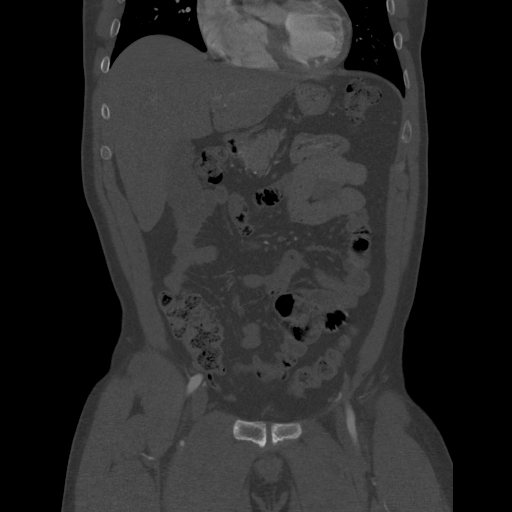

[Series 602: sagittal body · sagittal · 0.96mm/px · 4 of 157 slices shown]
[im 27/157  soft-tissue]
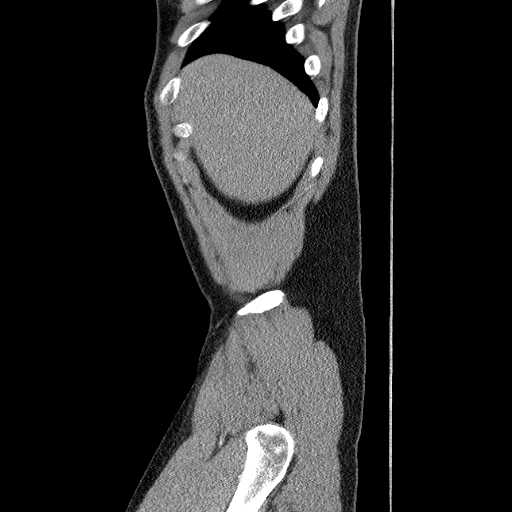
[im 53/157  soft-tissue]
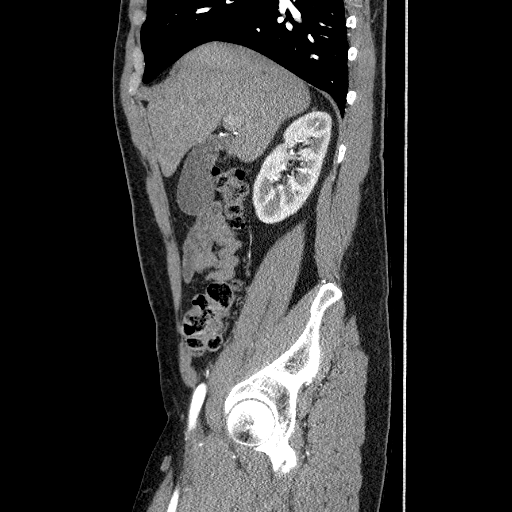
[im 79/157  soft-tissue]
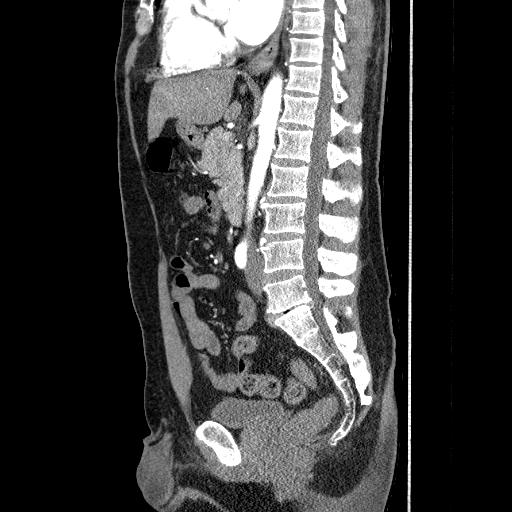
[im 105/157  soft-tissue]
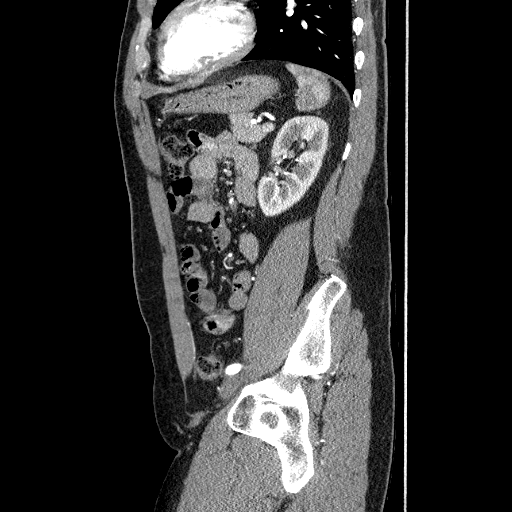

[12 of 36 positions shown; findings below may reference images not displayed]

FINDINGS: The aorta is non aneurysmal and patent.

Celiac axis is widely patent.  Branch vessels are patent.

SMA is widely patent. Branch vessels are patent. Replaced right
hepatic artery anatomy.

IMA is patent.  Branch vessels are patent.

Right renal artery is patent.  Left renal artery is patent.

Bilateral common, internal, and external iliac arteries are all
widely patent. There is mild tortuosity of the iliac arteries.

Common femoral arteries are widely patent. Upper superficial femoral
arteries are widely patent.

There is mild enlargement of the left atrium. No significant mitral
valve calcification.

Multiple sub cm liver hypodensities. The largest is 7 mm in the
right lobe on image 36 of series 4.

Spleen, pancreas, adrenal glands, and kidneys are unremarkable.

Umbilical hernia contains only adipose tissue and is small.

Normal appendix, bladder, and prostate.

Advanced degenerative disc disease at L5-S1 with severe disc space
narrowing and vacuum. Bilateral lateral recess narrowing right
greater than left. This could result in right S1 nerve roots
symptomatology.

Review of the MIP images confirms the above findings.
IMPRESSION: No significant arterial occlusive disease in the abdomen or pelvis.
Specifically, aorta and iliac arteries are widely patent.

L5-S1 degenerative disc disease.

Subcentimeter hypodensites in the liver which are likely benign. If
there is a history of malignancy or high risk for liver malignancy,
follow-up MRI at 6 months is recommended

## 2016-02-05 ENCOUNTER — Other Ambulatory Visit: Payer: Self-pay | Admitting: Cardiovascular Disease

## 2016-02-10 ENCOUNTER — Telehealth: Payer: Self-pay | Admitting: Cardiovascular Disease

## 2016-02-10 IMAGING — CR DG CHEST 2V
2 series · 2 of 2 positions shown · non-contrast
Comparison: 05/24/2010

CLINICAL DATA: 47-year-old male -preoperative respiratory
examination for mitral valve surgery.

EXAM:
CHEST  2 VIEW

[w chest pa]
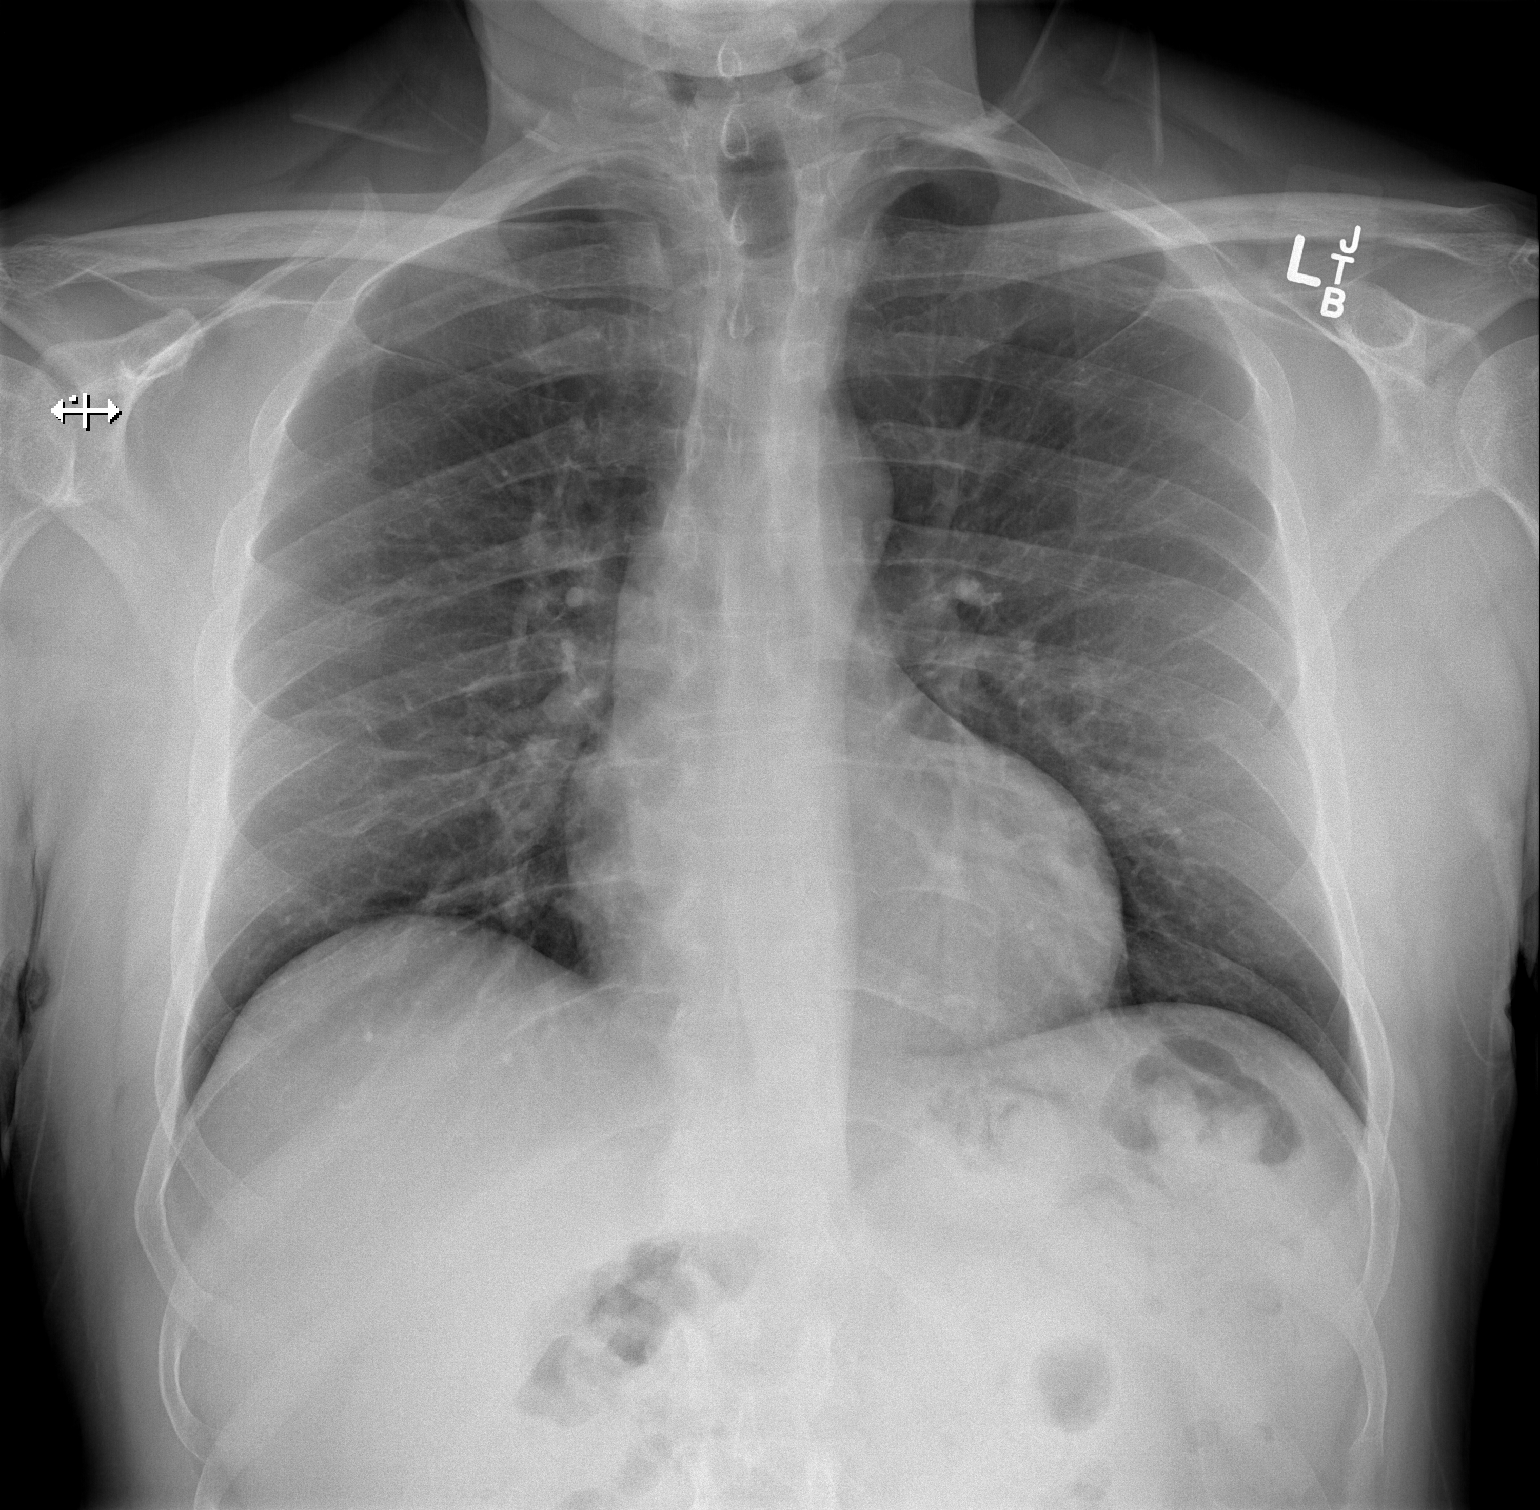

[w chest lat]
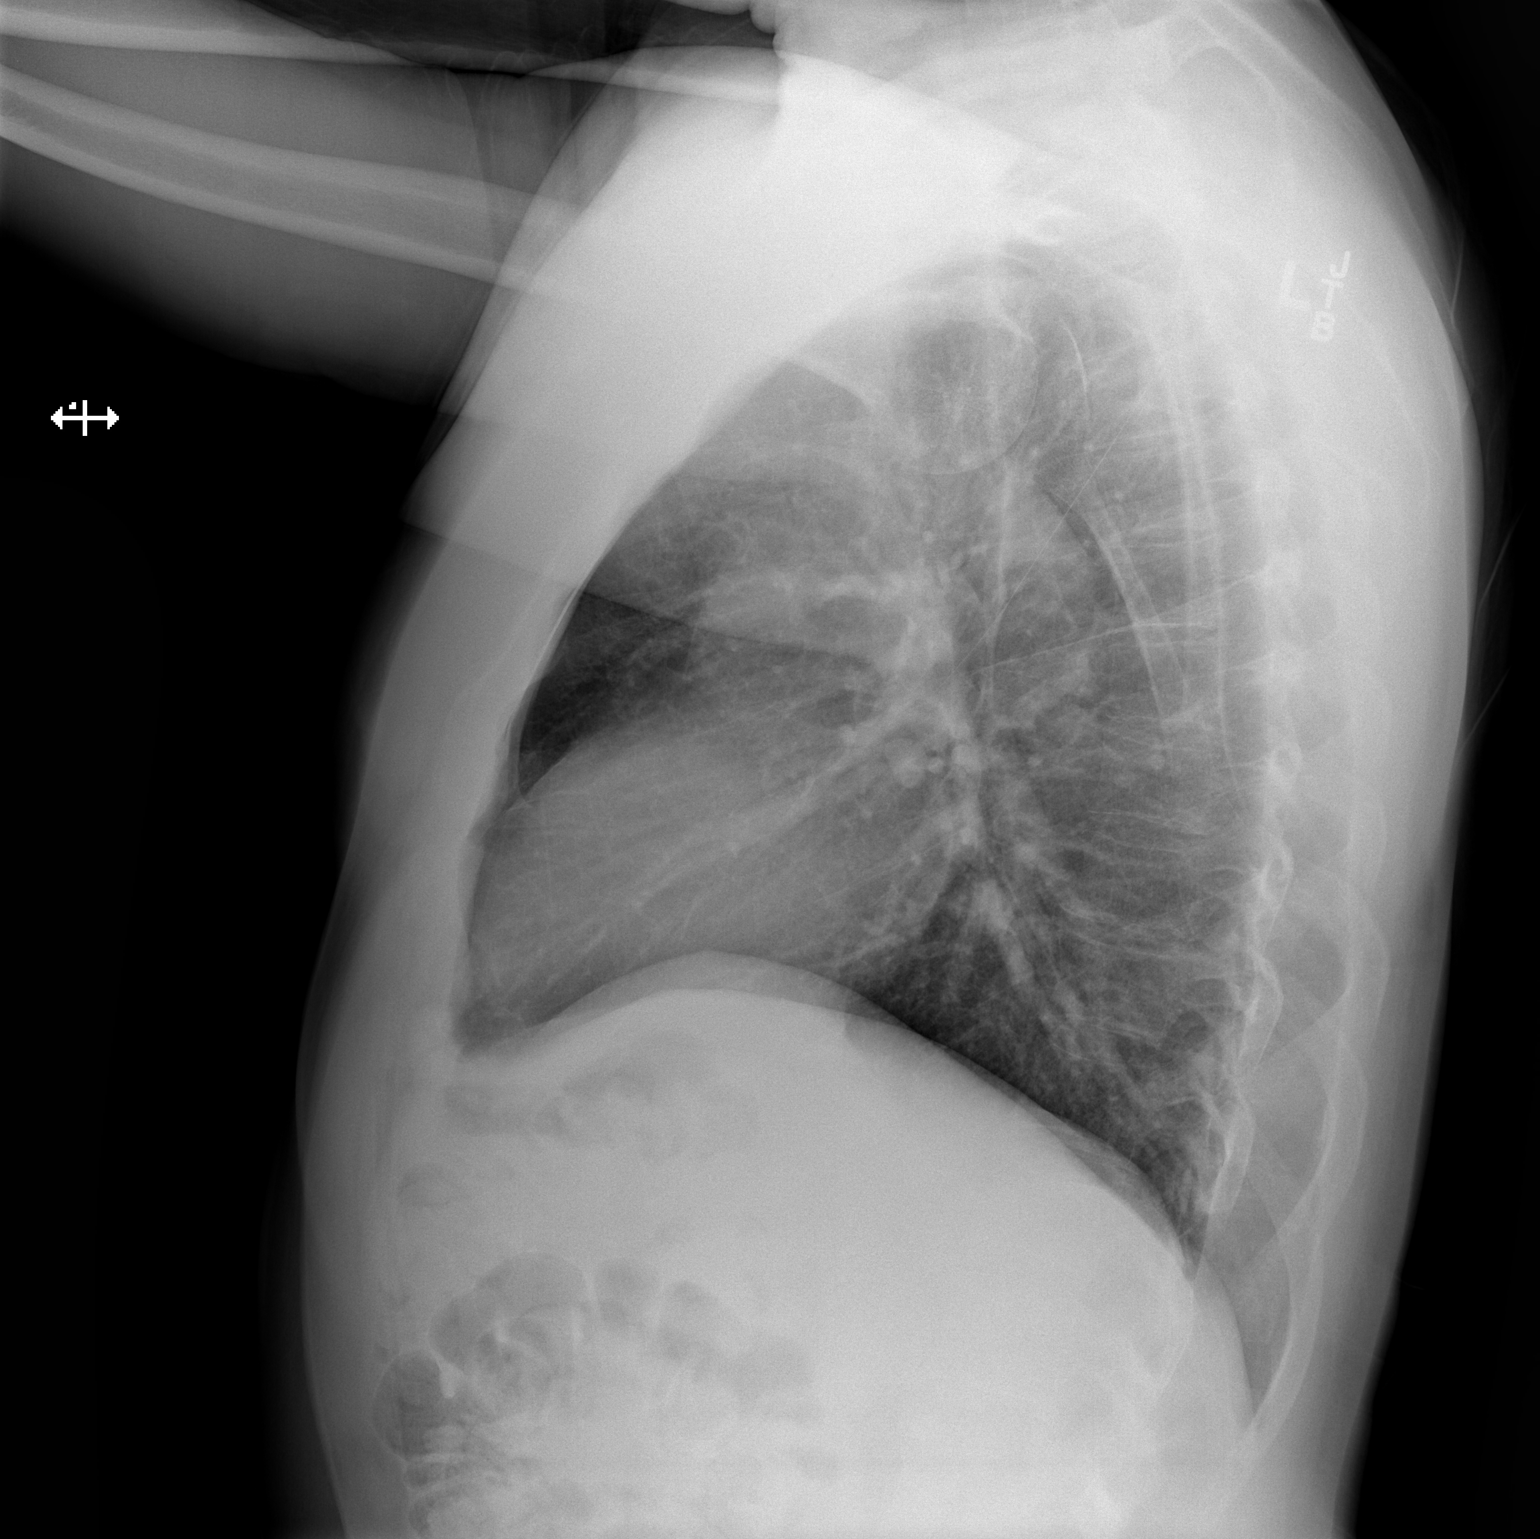

[2 of 2 positions shown; findings below may reference images not displayed]

FINDINGS: Upper limits normal heart size noted.

There is no evidence of focal airspace disease, pulmonary edema,
suspicious pulmonary nodule/mass, pleural effusion, or pneumothorax.
No acute bony abnormalities are identified.
IMPRESSION: Upper limits normal heart size. No evidence of active
cardiopulmonary disease.

## 2016-02-10 MED ORDER — LISINOPRIL 2.5 MG PO TABS: ORAL_TABLET | ORAL | 4 refills | Status: DC

## 2016-02-10 NOTE — Telephone Encounter (Signed)
New Message   *STAT* If patient is at the pharmacy, call can be transferred to refill team.   1. Which medications need to be refilled? (please list name of each medication and dose if known)  metoprolol tartrate (lopressor) 25 MG tablets 12.5 mg tablets twice daily  2. Which pharmacy/location (including street and city if local pharmacy) is medication to be sent to? [facility] [phone], [address] [place], [place]  3. Do they need a 30 day or 90 day supply?  30 day supply

## 2016-02-10 NOTE — Telephone Encounter (Signed)
Called pt and pt stated that he needed a refill on Lisinopril not metoprolol. I informed that pt that I was sending the refill to his pharmacy and if he has any other problems, questions or concerns to call the office. Pt verbalized understanding.

## 2016-02-12 IMAGING — CR DG CHEST 1V PORT
1 series · 1 of 1 positions shown · non-contrast
Comparison: 10/09/2013.

CLINICAL DATA: Intubation.

EXAM:
PORTABLE CHEST - 1 VIEW

[AP]
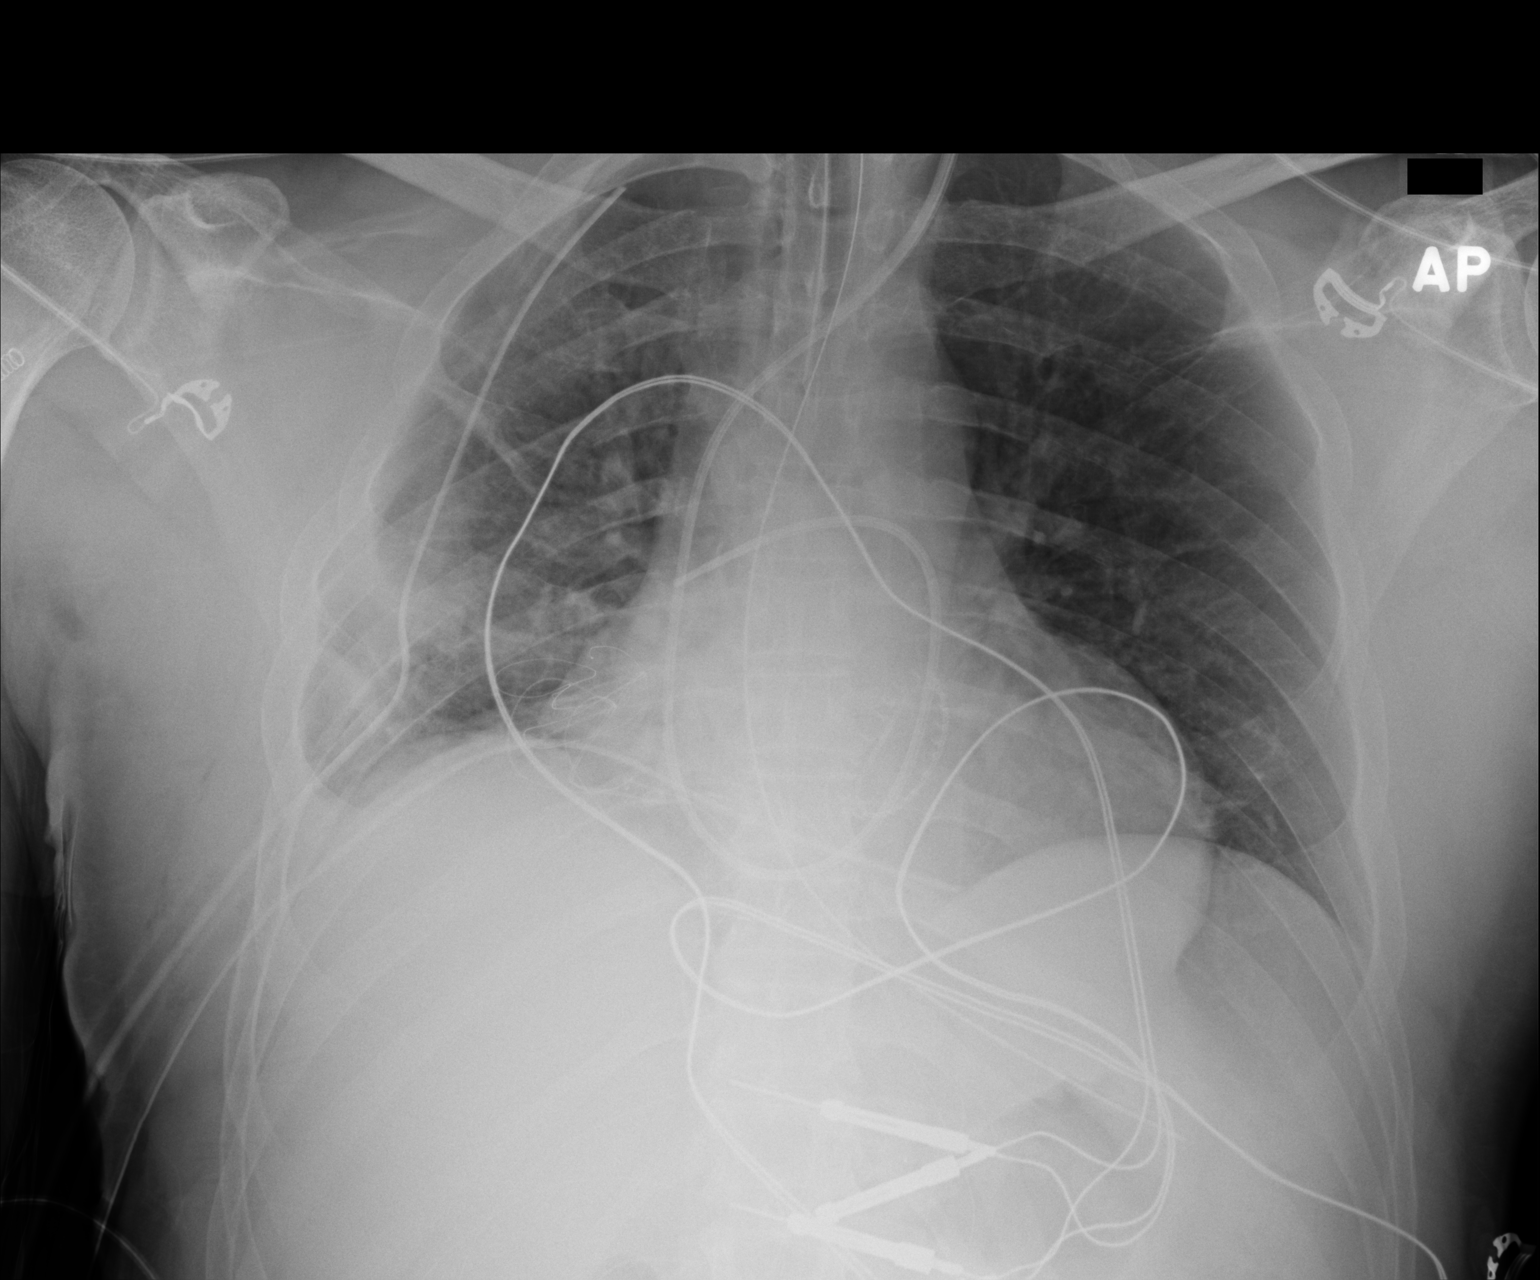

[1 of 1 positions shown; findings below may reference images not displayed]

FINDINGS: Endotracheal tube and NG tube in good anatomic position. Swan-Ganz
catheter noted with tip over right main pulmonary artery. Two right
chest tubes are noted over the right chest. The lower chest tube tip
is projected over the left lower medial chest, clinical correlation
is suggested. Cardiomegaly, normal pulmonary vascularity. Cardiac
valve replacement. Right lower lobe atelectasis and/or infiltrate.
Small right pleural effusion. No pneumothorax.
IMPRESSION: 1. Endotracheal tube, NG tube, Swan-Ganz catheter in good anatomic
position.
2. There are 2 right chest tubes. The lower most chest tube is tip
is projected over the medial left chest, clinical correlation
suggested.
3. Cardiomegaly. Prior cardiac valve replacement. No pulmonary
venous congestion.
4. Right lower lobe atelectasis and or infiltrate. Small right
pleural effusion.

## 2016-02-13 IMAGING — CR DG CHEST 1V PORT
1 series · 1 of 1 positions shown · non-contrast
Comparison: the previous day's study

CLINICAL DATA: postop

EXAM:
PORTABLE CHEST - 1 VIEW

[AP]
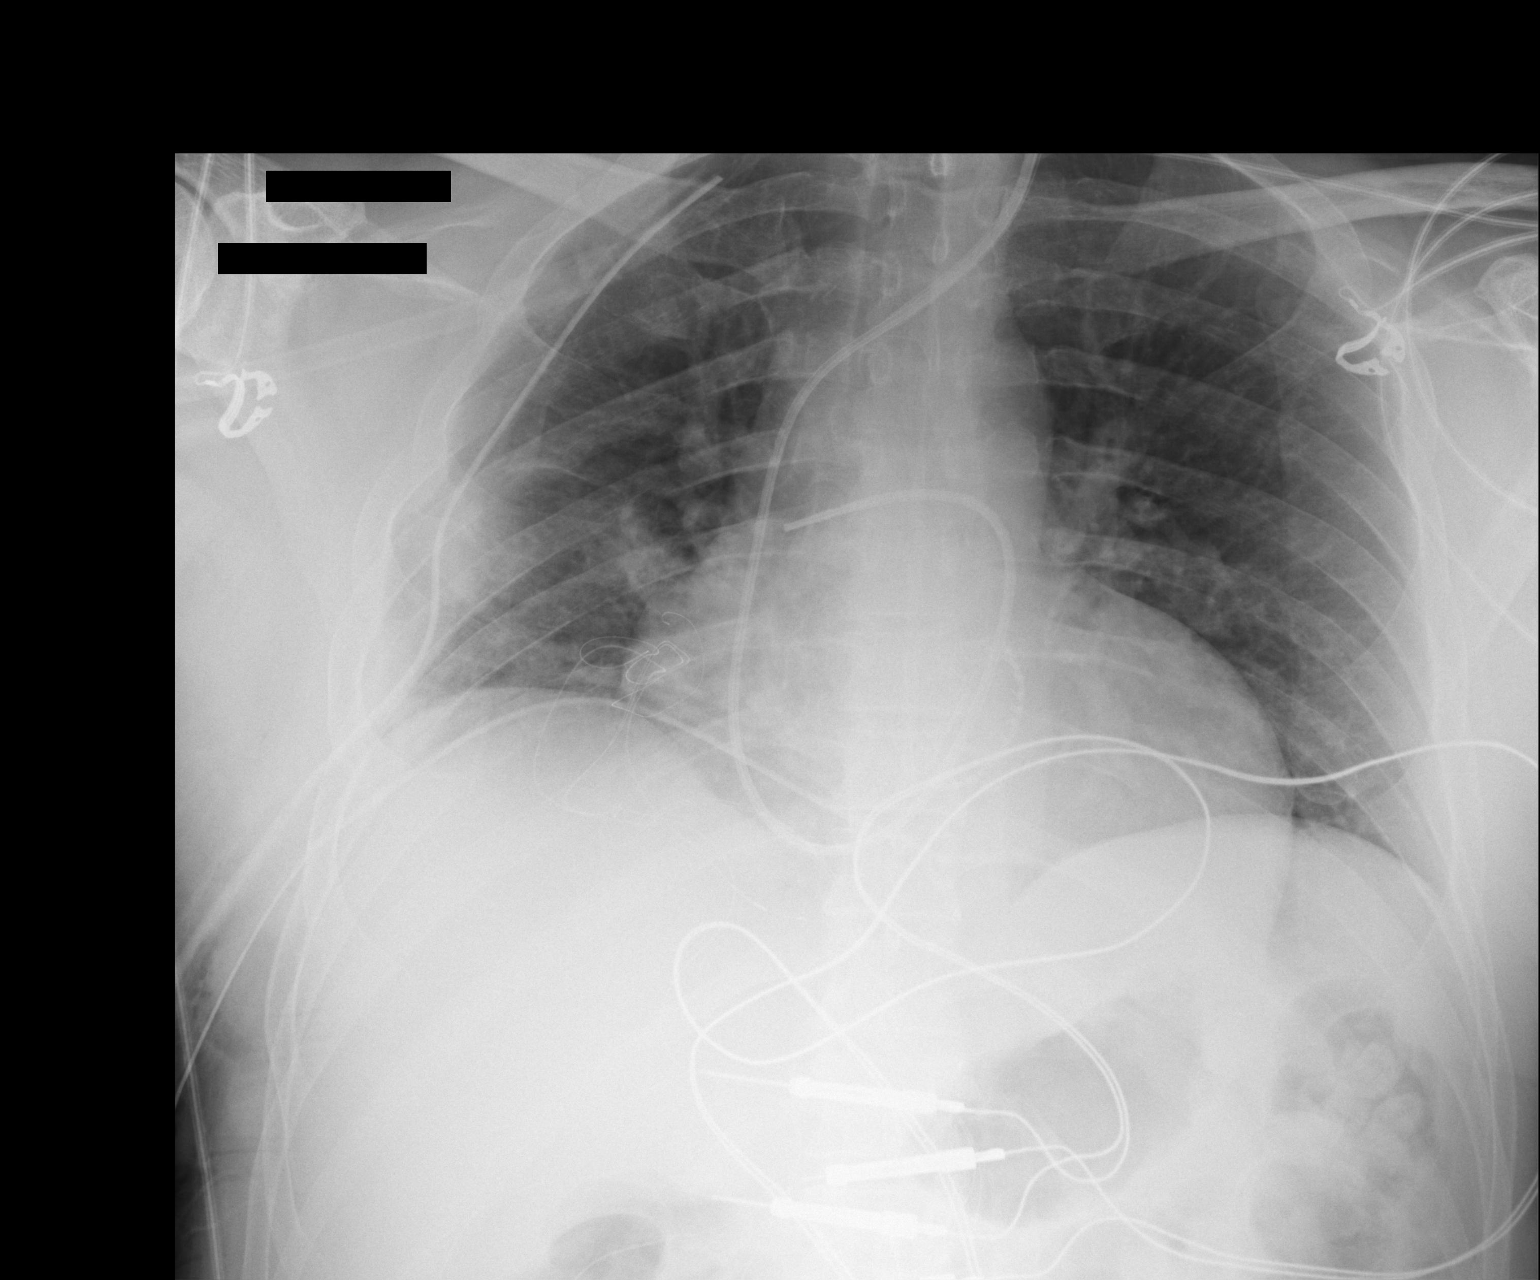

[1 of 1 positions shown; findings below may reference images not displayed]

FINDINGS: The patient has been extubated and the nasogastric tube removed.
Left IJ Delice remains in the right pulmonary artery. Two right
chest tubes remain in place with no pneumothorax. There is some
patchy atelectasis at the right lung base, improved from previous
exam. No effusion. Left lung clear. Heart size upper limits normal
for technique.
IMPRESSION: 1. Interval extubation with improving aeration at the right lung
base.
2. Stable right chest tubes with no pneumothorax.

## 2016-03-02 ENCOUNTER — Other Ambulatory Visit: Payer: Self-pay | Admitting: Family Medicine

## 2016-03-07 ENCOUNTER — Other Ambulatory Visit: Payer: BLUE CROSS/BLUE SHIELD

## 2016-03-27 ENCOUNTER — Other Ambulatory Visit: Payer: Self-pay | Admitting: Family Medicine

## 2016-03-28 ENCOUNTER — Other Ambulatory Visit: Payer: Self-pay | Admitting: Family Medicine

## 2016-03-28 ENCOUNTER — Telehealth: Payer: Self-pay | Admitting: *Deleted

## 2016-03-28 NOTE — Telephone Encounter (Signed)
Patient left a voicemail requesting refill on Allopurinol. Last refill 03/02/16 #15 and advised to schedule appointment for further refills. No upcoming appointment scheduled Last office visit with Dr. Ermalene Searing 12/23/14 Okay to refill?

## 2016-03-29 MED ORDER — ALLOPURINOL 100 MG PO TABS: ORAL_TABLET | ORAL | 0 refills | Status: DC

## 2016-03-29 NOTE — Telephone Encounter (Signed)
CPE scheduled 04/08/2016 at 3:15 pm with Dr. Ermalene Searing. Mr. Zeiders wants to get labs drawn through his work.  Allopurinol refilled for #15 to get him to his appointment.

## 2016-03-29 NOTE — Telephone Encounter (Signed)
No further refills until appt scheduled for CPX, labs prior

## 2016-04-05 ENCOUNTER — Encounter: Payer: Self-pay | Admitting: Physician Assistant

## 2016-04-08 ENCOUNTER — Encounter: Payer: Self-pay | Admitting: Family Medicine

## 2016-04-08 ENCOUNTER — Ambulatory Visit (INDEPENDENT_AMBULATORY_CARE_PROVIDER_SITE_OTHER): Payer: BLUE CROSS/BLUE SHIELD | Admitting: Family Medicine

## 2016-04-08 VITALS — BP 110/72 | HR 75 | Temp 98.2°F | Ht 69.75 in | Wt 196.5 lb

## 2016-04-08 DIAGNOSIS — M109 Gout, unspecified: Secondary | ICD-10-CM | POA: Diagnosis not present

## 2016-04-08 DIAGNOSIS — E782 Mixed hyperlipidemia: Secondary | ICD-10-CM

## 2016-04-08 DIAGNOSIS — R7303 Prediabetes: Secondary | ICD-10-CM

## 2016-04-08 DIAGNOSIS — Z0000 Encounter for general adult medical examination without abnormal findings: Secondary | ICD-10-CM | POA: Diagnosis not present

## 2016-04-08 MED ORDER — ALLOPURINOL 100 MG PO TABS: ORAL_TABLET | ORAL | 3 refills | Status: DC

## 2016-04-08 NOTE — Progress Notes (Signed)
Subjective:    Patient ID: Lawrence Underwood, male    DOB: March 12, 1966, 51 y.o.   MRN: [id number]  HPI    The patient is here for annual wellness exam and preventative care.    Mitral valve repair for severe prolapse with large flail segment. By Dr. Cornelius Moras CVTS.  Nonischemic cardiomyopathy followed by Dr. Copper [facility]. On lisinopril, metoprolol.  No edema, no CP, no SOB.   BP Readings from Last 3 Encounters:  04/08/16 110/72  09/28/15 112/70  07/10/15 102/70     Elevated Cholesterol: On crestor 5 mg daily M and F   Felt better with dizziness off  atorvastatin   LDL 120 in 11/2015 now down to 98 Using medications without problems: none Muscle aches:  None Diet compliance: moderate Exercise: daily.. walking  Body mass index is 28.4 kg/m. Other complaints:  Prediabetes: glucose 99  Gout:  Uric acid 8.1.. Off allopurinol in last 2 month No gout flares while off , now back on.  GERD: no issues.  Social History /Family History/Past Medical History reviewed and updated if needed. Review of Systems  Constitutional: Negative for fatigue and fever.  HENT: Negative for ear pain.   Eyes: Negative for pain.  Respiratory: Negative for cough and shortness of breath.   Cardiovascular: Negative for chest pain, palpitations and leg swelling.  Gastrointestinal: Negative for abdominal pain.  Genitourinary: Negative for dysuria.  Musculoskeletal: Negative for arthralgias.  Neurological: Negative for syncope, light-headedness and headaches.  Psychiatric/Behavioral: Negative for dysphoric mood.       Objective:   Physical Exam  Constitutional: He appears well-developed and well-nourished.  Non-toxic appearance. He does not appear ill. No distress.  HENT:  Head: Normocephalic and atraumatic.  Right Ear: Hearing, tympanic membrane, external ear and ear canal normal.  Left Ear: Hearing, tympanic membrane, external ear and ear canal normal.  Nose: Nose normal.  Mouth/Throat: Uvula is  midline, oropharynx is clear and moist and mucous membranes are normal.  Eyes: Conjunctivae, EOM and lids are normal. Pupils are equal, round, and reactive to light. Lids are everted and swept, no foreign bodies found.  Neck: Trachea normal, normal range of motion and phonation normal. Neck supple. Carotid bruit is not present. No thyroid mass and no thyromegaly present.  Cardiovascular: Normal rate, regular rhythm, S1 normal, S2 normal, intact distal pulses and normal pulses.  Exam reveals no gallop.   No murmur heard. Pulmonary/Chest: Breath sounds normal. He has no wheezes. He has no rhonchi. He has no rales.  Abdominal: Soft. Normal appearance and bowel sounds are normal. There is no hepatosplenomegaly. There is no tenderness. There is no rebound, no guarding and no CVA tenderness. No hernia.  Lymphadenopathy:    He has no cervical adenopathy.  Neurological: He is alert. He has normal strength and normal reflexes. No cranial nerve deficit or sensory deficit. Gait normal.  Skin: Skin is warm, dry and intact. No rash noted.  Psychiatric: He has a normal mood and affect. His speech is normal and behavior is normal. Judgment normal.          Assessment & Plan:  The patient's preventative maintenance and recommended screening tests for an annual wellness exam were reviewed in full today. Brought up to date unless services declined.  Counselled on the importance of diet, exercise, and its role in overall health and mortality. The patient's FH and SH was reviewed, including their home life, tobacco status, and drug and alcohol status.    Vaccines: uptodate Prostate Cancer  Screen: no early family history  Colon Cancer Screen:  No early co cancer in family      Smoking Status:none ETOH/ drug use:  ETOh on weekends occ more than 2none   HIV screen:   refused.

## 2016-04-08 NOTE — Assessment & Plan Note (Signed)
No back on allopurinol.. No flares.

## 2016-04-08 NOTE — Assessment & Plan Note (Signed)
Fairly good control on 2 days a week of crestor low dose tolerating. He will talk with Cardiology about possibly increasing tpo 3 days a week if tolerated to try to lower LDL further. Encouraged exercise, weight loss, healthy eating habits.Marland Kitchen

## 2016-04-08 NOTE — Assessment & Plan Note (Signed)
Improved control. 

## 2016-04-08 NOTE — Patient Instructions (Addendum)
Decrease ETOH to no more than 2 drinks a day on weekends. Keep up regular exercise, work on healthy eating habits.

## 2016-04-11 ENCOUNTER — Encounter: Payer: Self-pay | Admitting: Physician Assistant

## 2016-05-06 ENCOUNTER — Encounter: Payer: BLUE CROSS/BLUE SHIELD | Admitting: Family Medicine

## 2016-05-27 ENCOUNTER — Other Ambulatory Visit: Payer: Self-pay | Admitting: Cardiovascular Disease

## 2016-06-13 ENCOUNTER — Other Ambulatory Visit: Payer: Self-pay | Admitting: Physician Assistant

## 2016-06-13 MED ORDER — LISINOPRIL 2.5 MG PO TABS: ORAL_TABLET | ORAL | 0 refills | Status: DC

## 2016-06-13 MED ORDER — METOPROLOL TARTRATE 25 MG PO TABS: 12.5000 mg | ORAL_TABLET | Freq: Two times a day (BID) | ORAL | 0 refills | Status: DC

## 2016-07-04 ENCOUNTER — Other Ambulatory Visit: Payer: Self-pay | Admitting: Cardiovascular Disease

## 2016-07-30 ENCOUNTER — Other Ambulatory Visit: Payer: Self-pay | Admitting: Cardiovascular Disease

## 2016-08-08 ENCOUNTER — Other Ambulatory Visit: Payer: Self-pay | Admitting: Cardiovascular Disease

## 2016-08-08 DIAGNOSIS — E782 Mixed hyperlipidemia: Secondary | ICD-10-CM

## 2016-08-08 DIAGNOSIS — I2510 Atherosclerotic heart disease of native coronary artery without angina pectoris: Secondary | ICD-10-CM

## 2016-08-08 MED ORDER — METOPROLOL TARTRATE 25 MG PO TABS: 12.5000 mg | ORAL_TABLET | Freq: Two times a day (BID) | ORAL | 0 refills | Status: DC

## 2016-08-21 ENCOUNTER — Encounter: Payer: Self-pay | Admitting: Family Medicine

## 2016-08-23 ENCOUNTER — Encounter: Payer: Self-pay | Admitting: Physician Assistant

## 2016-08-23 ENCOUNTER — Telehealth: Payer: Self-pay | Admitting: Physician Assistant

## 2016-08-23 MED ORDER — METOPROLOL TARTRATE 25 MG PO TABS: 12.5000 mg | ORAL_TABLET | Freq: Two times a day (BID) | ORAL | 0 refills | Status: DC

## 2016-08-23 NOTE — Telephone Encounter (Signed)
New message       *STAT* If patient is at the pharmacy, call can be transferred to refill team.   1. Which medications need to be refilled? (please list name of each medication and dose if known)  Metoprolol 25mg   2. Which pharmacy/location (including street and city if local pharmacy) is medication to be sent to[facility] [place] [facility]. Do they need a 30 day or 90 day supply? 30

## 2016-08-23 NOTE — Telephone Encounter (Signed)
metoprolol tartrate (LOPRESSOR) 25 MG tablet  Medication  Date: 08/08/2016 Department: [facility] [address] Office Ordering/Authorizing: Tonny Bollman, MD  Order Providers   Prescribing Provider Encounter Provider  Tonny Bollman, MD Tonny Bollman, MD  Medication Detail    Disp Refills Start End   metoprolol tartrate (LOPRESSOR) 25 MG tablet 15 tablet 0 08/08/2016    Sig - Route: Take 0.5 tablets (12.5 mg total) by mouth 2 (two) times daily. *Please call and schedule a one year follow up appointment* - Oral   Notes to Pharmacy: Please call our office to schedule an overdue yearly appointment before anymore refills. [phone]. Thank you 3rd and Final Attempt   E-Prescribing Status: Receipt confirmed by pharmacy (08/08/2016 1:57 PM EDT)   Pharmacy   [id number] - [place], [place] - [address]

## 2016-08-30 ENCOUNTER — Encounter: Payer: Self-pay | Admitting: Physician Assistant

## 2016-08-30 ENCOUNTER — Ambulatory Visit (INDEPENDENT_AMBULATORY_CARE_PROVIDER_SITE_OTHER): Payer: BLUE CROSS/BLUE SHIELD | Admitting: Physician Assistant

## 2016-08-30 VITALS — BP 116/68 | HR 76 | Ht 70.0 in | Wt 190.4 lb

## 2016-08-30 DIAGNOSIS — I2510 Atherosclerotic heart disease of native coronary artery without angina pectoris: Secondary | ICD-10-CM

## 2016-08-30 DIAGNOSIS — Z9889 Other specified postprocedural states: Secondary | ICD-10-CM

## 2016-08-30 MED ORDER — METOPROLOL TARTRATE 25 MG PO TABS: 12.5000 mg | ORAL_TABLET | Freq: Two times a day (BID) | ORAL | 3 refills | Status: DC

## 2016-08-30 NOTE — Progress Notes (Signed)
Cardiology Office Note:    Date:  08/30/2016   ID:  Lawrence Underwood, DOB [birth year], MRN [id number]  PCP:  Excell Seltzer, MD  Cardiologist:  Dr. Tonny Bollman    Referring MD: Excell Seltzer, MD   Chief Complaint  Patient presents with  . Follow-up    s/p MV repair    History of Present Illness:    Lawrence Underwood is a 51 y.o. male with a hx of Mitral valve prolapse with mitral regurgitation status post minimally invasive repair in 09/2013, nonobstructive CAD and hyperlipidemia. Echo in 2016 demonstrated normal LV function with moderate diastolic dysfunction normally functioning mitral valve repair. Last seen 4/17.  Lawrence Underwood returns for Cardiology follow up.  He is here alone.  He is overall doing well.  He has rare episodes of lightheadedness.  He sometimes takes Metoprolol tartrate 25 mg QD instead of 12.5 mg Twice daily.  He has not had any chest pain, shortness of breath, syncope, orthopnea, PND, edema. He feels much better on Rosuvastatin than Atorvastatin.   Prior CV studies:   The following studies were reviewed today:  Echo 3/16 Mild LVH, EF 50-55%, normal wall motion, grade 1 diastolic dysfunction, MV repair with mild MS (mean 6 mmHg) and no MR   Carotid US 7/15 Bilateral ICA 1-39%   LHC 7/15 LM patent LAD patent LCx patent RCA mid 40% Vigorous LVEF, EF 65%, severe mitral regurgitation   Past Medical History:  Diagnosis Date  . Coronary artery disease 09/18/2013   40% stenosis RCA and otherwise non-obstructive CAD   . Dizziness    r/t mitral valve  . GERD (gastroesophageal reflux disease)    no meds  . Gout    but doesn't take any meds for this  . H/O hiatal hernia   . History of bronchitis 2 yrs ago  . Hyperlipidemia   . Mitral regurgitation due to cusp prolapse 08/26/2013  . 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   Complex valvuloplasty including triangular resection of flail posterior leaflet, artificial Gore-tex neocord placement x4,  Sorin Memo 3D ring annuloplasty (size 34 mm) via right mini thoracotomy approach    Past Surgical History:  Procedure Laterality Date  . CARDIAC CATHETERIZATION  09/18/13  . HAND SURGERY     R hand  . INTRAOPERATIVE TRANSESOPHAGEAL ECHOCARDIOGRAM N/A 10/11/2013   Procedure: INTRAOPERATIVE TRANSESOPHAGEAL ECHOCARDIOGRAM;  Surgeon: Purcell Nails, MD;  Location: [facility] OR;  Service: Open Heart Surgery;  Laterality: N/A;  . KNEE SURGERY Left    arthroscopy  . LEFT AND RIGHT HEART CATHETERIZATION WITH CORONARY ANGIOGRAM N/A 09/18/2013   Procedure: LEFT AND RIGHT HEART CATHETERIZATION WITH CORONARY ANGIOGRAM;  Surgeon: Micheline Chapman, MD;  Location[facility] C[facility] LAB;  Service: Cardiovascular;  Laterality: N/A;  . MITRAL VALVE REPAIR Right 10/11/2013   Procedure: MINIMALLY INVASIVE MITRAL VALVE REPAIR (MVR);  Surgeon: Purcell Nails, MD;  Location: [facility] OR;  Service: Open Heart Surgery;  Laterality: Right;  . TEE WITHOUT CARDIOVERSION N/A 08/26/2013   Procedure: TRANSESOPHAGEAL ECHOCARDIOGRAM (TEE);  Surgeon: [name], MD;  Location: [facility] ENDOSCOPY;  Service: Cardiovascular;  Laterality: N/A;  . WISDOM TOOTH EXTRACTION      Current Medications: Current Meds  Medication Sig  . allopurinol (ZYLOPRIM) 100 MG tablet Take 100 mg by mouth daily.  Marland Kitchen aspirin EC 81 MG tablet Take 1 tablet (81 mg total) by mouth daily.  Marland Kitchen lisinopril (PRINIVIL,ZESTRIL) 2.5 MG tablet TAKE 1 TABLET (2.5 MG TOTAL) BY MOUTH DAILY.  Marland Kitchen  rosuvastatin (CRESTOR) 5 MG tablet Take 1 tablet (5 mg total) by mouth as directed. TAKE 1 TABLET EVERY Monday AND FRIDAY  . [DISCONTINUED] allopurinol (ZYLOPRIM) 100 MG tablet TAKE 1 TABLET (100 MG TOTAL) BY MOUTH DAILY.  . [DISCONTINUED] metoprolol tartrate (LOPRESSOR) 25 MG tablet Take 0.5 tablets (12.5 mg total) by mouth 2 (two) times daily. Keep 08/30/16 appt to receive yearly refill supply.     Allergies:   Lisinopril   Social History   Social History  . Marital status: Married    Spouse  name: N/A  . Number of children: N/A  . Years of education: N/A   Occupational History  . [employer]    Social History Main Topics  . Smoking status: Never Smoker  . Smokeless tobacco: Never Used  . Alcohol use 0.0 oz/week     Comment: weekends  . Drug use: No  . Sexual activity: Yes   Other Topics Concern  . None   Social History Narrative  . None     Family Hx: The patient's family history includes CVA (age of onset: 34) in his father; Gout in his father, maternal grandfather, maternal grandmother, paternal grandfather, and paternal grandmother; Heart attack (age of onset: 80) in his father; Hyperlipidemia in his mother.  ROS:   Please see the history of present illness.    Review of Systems  Neurological: Positive for dizziness.  Psychiatric/Behavioral: The patient is nervous/anxious.    All other systems reviewed and are negative.   EKGs/Labs/Other Test Reviewed:    EKG:  EKG is  ordered today.  The ekg ordered today demonstrates NSR, HR 76, LAD, QTc 434 ms, no significant changes.   Recent Labs: No results found for requested labs within last 8760 hours.   Recent Lipid Panel Lab Results  Component Value Date/Time   CHOL 198 12/04/2015 08:06 AM   TRIG 121 12/04/2015 08:06 AM   HDL 54 12/04/2015 08:06 AM   CHOLHDL 3.7 12/04/2015 08:06 AM   LDLCALC 120 12/04/2015 08:06 AM    Physical Exam:    VS:  BP 116/68   Pulse 76   Ht 5\' 10"  (1.778 m)   Wt 190 lb 6.4 oz (86.4 kg)   BMI 27.32 kg/m     Wt Readings from Last 3 Encounters:  08/30/16 190 lb 6.4 oz (86.4 kg)  04/08/16 196 lb 8 oz (89.1 kg)  09/28/15 200 lb (90.7 kg)     Physical Exam  Constitutional: He is oriented to person, place, and time. He appears well-developed and well-nourished. No distress.  HENT:  Head: Normocephalic and atraumatic.  Eyes: No scleral icterus.  Neck: Normal range of motion. No JVD present. Carotid bruit is not present.  Cardiovascular: Normal rate, regular rhythm, S1  normal and S2 normal.   No murmur heard. Pulmonary/Chest: Effort normal and breath sounds normal. He has no wheezes. He has no rhonchi. He has no rales.  Abdominal: Soft. There is no tenderness.  Musculoskeletal: He exhibits no edema.  Neurological: He is alert and oriented to person, place, and time.  Skin: Skin is warm and dry.  Psychiatric: He has a normal mood and affect.    ASSESSMENT:    1. S/P minimally invasive mitral valve repair   2. Coronary artery disease involving native coronary artery of native heart without angina pectoris    PLAN:    In order of problems listed above:  1. S/P minimally invasive mitral valve repair -  Overall doing well. He  did have reduced LVF after his MV repair that returned to normal by Echo in 2016.  Continue beta-blocker, ACE inhibitor.  If he wants to change Metoprolol tartrate to succinate for ease of taking, he will call us.  I suspect the 25 mg dose may be contributing to his dizziness.  He will make an effort to remain on 12.5 mg Twice daily for now.  He will continue SBE prophylaxis.  FU 1 year.   2. Coronary artery disease involving native coronary artery of native heart without angina pectoris -  Minimal plaque on LHC prior to MV repair.  Continue ASA, statin.  LDL < 100 in 03/2016.    Dispo:  Return in about 1 year (around 08/30/2017) for Routine Follow Up, w/ Dr. Excell Seltzer.   Medication Adjustments/Labs and Tests Ordered: Current medicines are reviewed at length with the patient today.  Concerns regarding medicines are outlined above.  Orders/Tests:  Orders Placed This Encounter  Procedures  . EKG 12-Lead   Medication changes: Meds ordered this encounter  Medications  . metoprolol tartrate (LOPRESSOR) 25 MG tablet    Sig: Take 0.5 tablets (12.5 mg total) by mouth 2 (two) times daily.    Dispense:  90 tablet    Refill:  3    Order Specific Question:   Supervising Provider    Answer:   Regan Lemming [[id number]]    Signed, Tereso Newcomer, PA-C  08/30/2016 3:16 PM    [facility] [address] [place], [place], [place]  [address] Phone: [phone]; Fax: [phone]

## 2016-08-30 NOTE — Patient Instructions (Signed)
Medication Instructions:  1. Your physician recommends that you continue on your current medications as directed. Please refer to the Current Medication list given to you today.   Labwork: NONE ORDRED  Testing/Procedures: NONE ORDERED  Follow-Up: Your physician wants you to follow-up in: 1 YEAR WITH DR. Theodoro Parma will receive a reminder letter in the mail two months in advance. If you don't receive a letter, please call our office to schedule the follow-up appointment.   Any Other Special Instructions Will Be Listed Below (If Applicable).     If you need a refill on your cardiac medications before your next appointment, please call your pharmacy.

## 2016-09-05 ENCOUNTER — Other Ambulatory Visit: Payer: Self-pay | Admitting: Cardiovascular Disease

## 2016-10-12 ENCOUNTER — Encounter: Payer: Self-pay | Admitting: Physician Assistant

## 2016-11-07 ENCOUNTER — Telehealth: Payer: Self-pay | Admitting: *Deleted

## 2016-11-07 ENCOUNTER — Other Ambulatory Visit: Payer: Self-pay | Admitting: Physician Assistant

## 2016-11-07 ENCOUNTER — Encounter: Payer: Self-pay | Admitting: Physician Assistant

## 2016-11-07 DIAGNOSIS — I2510 Atherosclerotic heart disease of native coronary artery without angina pectoris: Secondary | ICD-10-CM

## 2016-11-07 MED ORDER — EZETIMIBE 10 MG PO TABS: 10.0000 mg | ORAL_TABLET | Freq: Every day | ORAL | 0 refills | Status: DC

## 2016-11-07 NOTE — Telephone Encounter (Signed)
See MY CHART message to [employer]. PA today from pt. Lmtcb to schedule lab appt.

## 2016-11-08 ENCOUNTER — Encounter: Payer: Self-pay | Admitting: *Deleted

## 2016-11-08 NOTE — Telephone Encounter (Signed)
F/u message  Pt returning call. Pt states he is going to get his lab work complete through his job; its free. Pt states once he gets the results back he will fax them over. Please call back to discuss if needed,

## 2016-11-25 ENCOUNTER — Encounter: Payer: Self-pay | Admitting: Physician Assistant

## 2016-11-25 ENCOUNTER — Telehealth: Payer: Self-pay | Admitting: Physician Assistant

## 2016-11-25 DIAGNOSIS — M533 Sacrococcygeal disorders, not elsewhere classified: Secondary | ICD-10-CM | POA: Diagnosis not present

## 2016-11-25 DIAGNOSIS — F419 Anxiety disorder, unspecified: Secondary | ICD-10-CM | POA: Diagnosis not present

## 2016-11-25 DIAGNOSIS — Z298 Encounter for other specified prophylactic measures: Secondary | ICD-10-CM | POA: Diagnosis not present

## 2016-11-25 MED ORDER — AMOXICILLIN 500 MG PO CAPS: 2000.0000 mg | ORAL_CAPSULE | Freq: Once | ORAL | 2 refills | Status: AC

## 2016-11-25 NOTE — Telephone Encounter (Signed)
I s/w Alia at [facility] in regards to pt's upcoming dental work. I advised Zane Herald that pt will need SBE and that an Rx for Amoxicillin has been sent in. Zane Herald also request this information to be faxed over to their office. A letter was today with the recommendations over to [facility]. I have also lmptcb so that I may advise the pt that and Rx for Amoxicillin has been sent in and go over the instructions.

## 2016-11-25 NOTE — Telephone Encounter (Signed)
Chart indicates no allergy to penicillin. He will need Amoxicillin 2000 mg taken 30-60 minutes before his cleaning. I will send a Rx to his pharmacy. Tereso Newcomer, PA-C    11/25/2016 12:49 PM

## 2016-11-25 NOTE — Telephone Encounter (Signed)
Pt called and is inquiring about dental cleaning. He is inquiring as to the need for antibiotic prior to procedure. Pt has had MVR. In last ov note per Bing Neighbors. PA pt will need to continue with SBE. I will forward to Tereso Newcomer, PA for further recommendations as to what antibiotic.

## 2016-11-25 NOTE — Telephone Encounter (Signed)
New message   1. What dental office are you calling from? [facility] [phone] ask for [place]  2. What is your office phone and fax number [phone]  3. What type of procedure is the patient having performed? cleaning  4. What date is procedure scheduled?  Cleaning / unsure yet  5. What is your question (ex. Antibiotics prior to procedure, holding medication-we need to know how long dentist wants pt to hold med)? Zane Herald wants to know if pt needs any pre meds prior to his cleaning

## 2016-11-28 NOTE — Telephone Encounter (Signed)
New Message     Pt called answering service twice regarding medication  Once at 5:47pm 11/25/16  And again 11/26/16 1040a

## 2016-11-28 NOTE — Telephone Encounter (Signed)
Left message to call back tomorrow during normal business hours.

## 2016-11-29 NOTE — Telephone Encounter (Signed)
I called the pt to ensure that he did get the message about the Amoxicillin called in for his dental appt and when to take antibiotic.  Pt thanked me for the f/u call.

## 2017-03-07 ENCOUNTER — Other Ambulatory Visit: Payer: Self-pay | Admitting: Family Medicine

## 2017-03-26 ENCOUNTER — Encounter: Payer: Self-pay | Admitting: Family Medicine

## 2017-05-18 ENCOUNTER — Encounter: Payer: Self-pay | Admitting: Family Medicine

## 2017-05-18 DIAGNOSIS — F411 Generalized anxiety disorder: Secondary | ICD-10-CM

## 2017-05-20 ENCOUNTER — Encounter: Payer: Self-pay | Admitting: Physician Assistant

## 2017-05-22 ENCOUNTER — Telehealth: Payer: Self-pay | Admitting: Physician Assistant

## 2017-05-22 NOTE — Telephone Encounter (Signed)
Please schedule follow up with me to discuss cholesterol. Patient notes he is getting labs done this week. If Lipids done elsewhere, please request labs or patient can bring copy with him to office visit. Otherwise, arrange fasting Lipids here. Arrange follow up with me after Lipids done.  Tereso Newcomer, PA-C    05/22/2017 4:56 PM

## 2017-05-23 NOTE — Telephone Encounter (Signed)
Left message to go over recommendations per Tereso Newcomer, PA

## 2017-05-24 NOTE — Telephone Encounter (Signed)
Lmtcb x 2 to schedule appt per Tereso Newcomer, PA to discuss cholesterol .

## 2017-05-24 NOTE — Telephone Encounter (Signed)
S/w pt today in regards to needing appt to discuss cholesterol. Pt had sent Tereso Newcomer, PA message through [facility] CHART. Pt informs me that he will try to get his lab work in the next couple of days. Pt informs me that he just found out his grandmother of 50 years old is not doing well and he would like to wait on appt with PA as he will be spending time with his grandmother . I told pt that was perfectly fine and to let us know when he wants to make his appt. Pt thanked me for my call and our help.

## 2017-06-02 ENCOUNTER — Encounter: Payer: Self-pay | Admitting: Family Medicine

## 2017-06-02 ENCOUNTER — Ambulatory Visit: Payer: BLUE CROSS/BLUE SHIELD | Admitting: Family Medicine

## 2017-06-02 VITALS — BP 112/80 | HR 96 | Temp 99.7°F | Wt 195.0 lb

## 2017-06-02 DIAGNOSIS — Z20828 Contact with and (suspected) exposure to other viral communicable diseases: Secondary | ICD-10-CM

## 2017-06-02 DIAGNOSIS — Z9889 Other specified postprocedural states: Secondary | ICD-10-CM | POA: Diagnosis not present

## 2017-06-02 DIAGNOSIS — J101 Influenza due to other identified influenza virus with other respiratory manifestations: Secondary | ICD-10-CM

## 2017-06-02 LAB — POC INFLUENZA A&B (BINAX/QUICKVUE)
INFLUENZA A, POC: POSITIVE — AB
Influenza B, POC: NEGATIVE

## 2017-06-02 MED ORDER — OSELTAMIVIR PHOSPHATE 75 MG PO CAPS: 75.0000 mg | ORAL_CAPSULE | Freq: Two times a day (BID) | ORAL | 0 refills | Status: DC

## 2017-06-02 NOTE — Addendum Note (Signed)
Addended by: Roena Malady on: 06/02/2017 10:49 AM   Modules accepted: Orders

## 2017-06-02 NOTE — Assessment & Plan Note (Signed)
Discussed symptomatic care.  Hydration, rest. Call if SOB, cough worsening or prolongued fever. Reviewed flu timeline. He has hx of mitral valve repair that increases risk for complications , so we decided to use of tamiflu. Pt agreed. Discussed family prophylaxis, his children will call pediatrition to consider tamiflu prophylaxis. He was advised to not return to work until 24 hour after fever resolved on no antipyretics.

## 2017-06-02 NOTE — Progress Notes (Signed)
   Subjective:    Patient ID: Lawrence Underwood, male    DOB: February 13, 1966, 52 y.o.   MRN: [id number]  Cough  This is a new problem. The current episode started yesterday. The problem has been rapidly worsening. The cough is productive of sputum. Associated symptoms include chills, a fever, myalgias, a sore throat, shortness of breath and wheezing. Pertinent negatives include no ear pain, nasal congestion or rash. The symptoms are aggravated by lying down. Risk factors: nonsmoker. Treatments tried: tharaflu. The treatment provided mild relief. There is no history of asthma, COPD or environmental allergies.  Fever   Associated symptoms include coughing, a sore throat and wheezing. Pertinent negatives include no ear pain or rash.    exposure to flu from brother in law  Blood pressure 112/80, pulse 96, temperature 99.7 F (37.6 C), temperature source Oral, weight 195 lb (88.5 kg), SpO2 97 %.    Review of Systems  Constitutional: Positive for chills and fever.  HENT: Positive for sore throat. Negative for ear pain.   Respiratory: Positive for cough, shortness of breath and wheezing.   Musculoskeletal: Positive for myalgias.  Skin: Negative for rash.  Allergic/Immunologic: Negative for environmental allergies.       Objective:   Physical Exam  Constitutional: Vital signs are normal. He appears well-developed and well-nourished.  Non-toxic appearance. He appears ill. No distress.  HENT:  Head: Normocephalic and atraumatic.  Right Ear: Hearing, tympanic membrane, external ear and ear canal normal. No tenderness. No foreign bodies. Tympanic membrane is not retracted and not bulging.  Left Ear: Hearing, tympanic membrane, external ear and ear canal normal. No tenderness. No foreign bodies. Tympanic membrane is not retracted and not bulging.  Nose: Nose normal. No mucosal edema or rhinorrhea. Right sinus exhibits no maxillary sinus tenderness and no frontal sinus tenderness. Left sinus exhibits no  maxillary sinus tenderness and no frontal sinus tenderness.  Mouth/Throat: Uvula is midline, oropharynx is clear and moist and mucous membranes are normal. Normal dentition. No dental caries. No oropharyngeal exudate or tonsillar abscesses.  Eyes: Conjunctivae, EOM and lids are normal. Pupils are equal, round, and reactive to light. Lids are everted and swept, no foreign bodies found.  Neck: Trachea normal, normal range of motion and phonation normal. Neck supple. Carotid bruit is not present. No thyroid mass and no thyromegaly present.  Cardiovascular: Normal rate, regular rhythm, S1 normal, S2 normal, normal heart sounds, intact distal pulses and normal pulses. Exam reveals no gallop, no distant heart sounds and no friction rub.  No murmur heard. No peripheral edema  Pulmonary/Chest: Effort normal and breath sounds normal. No respiratory distress. He has no wheezes. He has no rhonchi. He has no rales.  Abdominal: Soft. Normal appearance and bowel sounds are normal. There is no hepatosplenomegaly. There is no tenderness. There is no rebound, no guarding and no CVA tenderness. No hernia.  Neurological: He is alert. He has normal reflexes.  Skin: Skin is warm, dry and intact. No rash noted.  Psychiatric: He has a normal mood and affect. His speech is normal and behavior is normal. Judgment and thought content normal.          Assessment & Plan:

## 2017-06-02 NOTE — Patient Instructions (Signed)
Rest. Fluids.  Tylenol or ibuprofen for body aches and fever.  Complete tamiflu.  Consider tamiflu prophylaxis for high risk close contacts.  Remain out from work until 24 hours after fever resolved.

## 2017-06-06 ENCOUNTER — Other Ambulatory Visit: Payer: Self-pay | Admitting: Family Medicine

## 2017-06-06 NOTE — Telephone Encounter (Signed)
Last office visit 06/02/2017 for Flu.  Last CPE 04/08/2016.  Ok to refill?

## 2017-07-09 ENCOUNTER — Other Ambulatory Visit: Payer: Self-pay | Admitting: Cardiovascular Disease

## 2017-08-25 ENCOUNTER — Ambulatory Visit: Payer: BLUE CROSS/BLUE SHIELD | Admitting: Cardiovascular Disease

## 2017-09-07 ENCOUNTER — Telehealth: Payer: Self-pay | Admitting: Family Medicine

## 2017-09-07 NOTE — Telephone Encounter (Signed)
Last office visit 06/02/2017 for Flu.  Last CPE 04/08/2016.  Last refilled 06/06/2017 for #90 with no refills.  Refill?

## 2017-09-08 NOTE — Telephone Encounter (Signed)
Robin, Please scheduled patient for CPE with fasting labs prior with Dr. Ermalene Searing.

## 2017-09-08 NOTE — Telephone Encounter (Signed)
Will refill... But pt needs CPX ASAP with labs prior

## 2017-09-11 NOTE — Telephone Encounter (Signed)
Left message asking pt to call office  °

## 2017-09-19 ENCOUNTER — Encounter: Payer: Self-pay | Admitting: Family Medicine

## 2017-09-19 NOTE — Telephone Encounter (Signed)
Left message asking pt to call office Mailed letter 

## 2017-09-25 ENCOUNTER — Other Ambulatory Visit: Payer: Self-pay | Admitting: Physician Assistant

## 2017-10-07 ENCOUNTER — Other Ambulatory Visit: Payer: Self-pay | Admitting: Cardiovascular Disease

## 2017-10-07 DIAGNOSIS — L255 Unspecified contact dermatitis due to plants, except food: Secondary | ICD-10-CM | POA: Diagnosis not present

## 2017-10-09 ENCOUNTER — Encounter: Payer: Self-pay | Admitting: Cardiovascular Disease

## 2017-10-09 ENCOUNTER — Ambulatory Visit: Payer: BLUE CROSS/BLUE SHIELD | Admitting: Cardiovascular Disease

## 2017-10-09 VITALS — BP 124/62 | HR 71 | Ht 70.0 in | Wt 198.0 lb

## 2017-10-09 DIAGNOSIS — I341 Nonrheumatic mitral (valve) prolapse: Secondary | ICD-10-CM | POA: Diagnosis not present

## 2017-10-09 DIAGNOSIS — E782 Mixed hyperlipidemia: Secondary | ICD-10-CM

## 2017-10-09 MED ORDER — LISINOPRIL 2.5 MG PO TABS: 2.5000 mg | ORAL_TABLET | Freq: Every day | ORAL | 3 refills | Status: DC

## 2017-10-09 NOTE — Patient Instructions (Signed)
Medication Instructions:  Your provider recommends that you continue on your current medications as directed. Please refer to the Current Medication list given to you today.    Labwork: Your provider recommends that you return for FASTING lab work in October.  Testing/Procedures: None  Follow-Up: Your provider wants you to follow-up in: 1 year with Dr. Excell Seltzer. You will receive a reminder letter in the mail two months in advance. If you don't receive a letter, please call our office to schedule the follow-up appointment.    Any Other Special Instructions Will Be Listed Below (If Applicable).     If you need a refill on your cardiac medications before your next appointment, please call your pharmacy.

## 2017-10-09 NOTE — Progress Notes (Signed)
Cardiology Office Note Date:  10/10/2017   ID:  Lawrence Underwood, DOB 1965/07/05, MRN [id number]  PCP:  Excell Seltzer, MD  Cardiologist:  Tonny Bollman, MD    Chief Complaint  Patient presents with  . Follow-up    mitral valve disease     History of Present Illness: Lawrence Underwood is a 52 y.o. male who presents for follow-up of mitral valve prolapse with mitral regurgitation status post minimally invasive mitral valve repair in 2015.  At that time he was noted to have nonobstructive coronary artery disease and hyperlipidemia.  The patient is here alone today.  He is been doing really well.  He gets in at least 10,000 steps every day.  He exercises regularly with no exertional symptoms.  His daughter is graduating from [employer] in December and his son is starting at [employer] this year.  He has another son who is entering ninth grade.  He specifically denies symptoms of chest pain, chest pressure, heart palpitations, or leg swelling.  He follows SBE prophylaxis.  He has occasional shortness of breath with climbing stairs.  No other symptoms reported.  He is had a lot of problems with cholesterol medication side effects.  He is currently not taking anything for cholesterol.  Past Medical History:  Diagnosis Date  . Coronary artery disease 09/18/2013   40% stenosis RCA and otherwise non-obstructive CAD   . Dizziness    r/t mitral valve  . GERD (gastroesophageal reflux disease)    no meds  . Gout    but doesn't take any meds for this  . H/O hiatal hernia   . History of bronchitis 2 yrs ago  . Hyperlipidemia   . Mitral regurgitation due to cusp prolapse 08/26/2013  . 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   Complex valvuloplasty including triangular resection of flail posterior leaflet, artificial Gore-tex neocord placement x4, Sorin Memo 3D ring annuloplasty (size 34 mm) via right mini thoracotomy approach    Past Surgical History:  Procedure Laterality Date  .  CARDIAC CATHETERIZATION  09/18/13  . HAND SURGERY     R hand  . INTRAOPERATIVE TRANSESOPHAGEAL ECHOCARDIOGRAM N/A 10/11/2013   Procedure: INTRAOPERATIVE TRANSESOPHAGEAL ECHOCARDIOGRAM;  Surgeon: Purcell Nails, MD;  Location: [facility] OR;  Service: Open Heart Surgery;  Laterality: N/A;  . KNEE SURGERY Left    arthroscopy  . LEFT AND RIGHT HEART CATHETERIZATION WITH CORONARY ANGIOGRAM N/A 09/18/2013   Procedure: LEFT AND RIGHT HEART CATHETERIZATION WITH CORONARY ANGIOGRAM;  Surgeon: Micheline Chapman, MD;  Location: [facility] CATH LAB;  Service: Cardiovascular;  Laterality: N/A;  . MITRAL VALVE REPAIR Right 10/11/2013   Procedure: MINIMALLY INVASIVE MITRAL VALVE REPAIR (MVR);  Surgeon: Purcell Nails, MD;  Location: [facility] OR;  Service: Open Heart Surgery;  Laterality: Right;  . TEE WITHOUT CARDIOVERSION N/A 08/26/2013   Procedure: TRANSESOPHAGEAL ECHOCARDIOGRAM (TEE);  Surgeon: [name], MD;  Location: [facility] ENDOSCOPY;  Service: Cardiovascular;  Laterality: N/A;  . WISDOM TOOTH EXTRACTION      Current Outpatient Medications  Medication Sig Dispense Refill  . allopurinol (ZYLOPRIM) 100 MG tablet TAKE 1 TABLET BY MOUTH EVERY DAY 30 tablet 0  . aspirin EC 81 MG tablet Take 1 tablet (81 mg total) by mouth daily.    Marland Kitchen ezetimibe (ZETIA) 10 MG tablet Take 1 tablet (10 mg total) by mouth daily. 360 tablet 0  . lisinopril (PRINIVIL,ZESTRIL) 2.5 MG tablet Take 1 tablet (2.5 mg total) by mouth daily. 90 tablet 3  .  metoprolol tartrate (LOPRESSOR) 25 MG tablet Take 0.5 tablets (12.5 mg total) by mouth 2 (two) times daily. Please keep upcoming appt for future refills. Thank you 90 tablet 0   No current facility-administered medications for this visit.     Allergies:   Lisinopril   Social History:  The patient  reports that he has never smoked. He has never used smokeless tobacco. He reports that he drinks alcohol. He reports that he does not use drugs.   Family History:  The patient's family history includes CVA  (age of onset: 38) in his father; Gout in his father, maternal grandfather, maternal grandmother, paternal grandfather, and paternal grandmother; Heart attack (age of onset: 83) in his father; Hyperlipidemia in his mother.   ROS:  Please see the history of present illness.  All other systems are reviewed and negative.   PHYSICAL EXAM: VS:  BP 124/62   Pulse 71   Ht 5\' 10"  (1.778 m)   Wt 198 lb (89.8 kg)   BMI 28.41 kg/m  , BMI Body mass index is 28.41 kg/m. GEN: Well nourished, well developed, in no acute distress  HEENT: normal  Neck: no JVD, no masses. No carotid bruits Cardiac: RRR without murmur or gallop                Respiratory:  clear to auscultation bilaterally, normal work of breathing GI: soft, nontender, nondistended, + BS MS: no deformity or atrophy  Ext: no pretibial edema, pedal pulses 2+= bilaterally Skin: warm and dry, no rash Neuro:  Strength and sensation are intact Psych: euthymic mood, full affect  EKG:  EKG is ordered today. The ekg ordered today shows normal sinus rhythm 71 bpm, right atrial enlargement, incomplete right bundle branch block.  Recent Labs: No results found for requested labs within last 8760 hours.   Lipid Panel     Component Value Date/Time   CHOL 198 12/04/2015 0806   TRIG 121 12/04/2015 0806   HDL 54 12/04/2015 0806   CHOLHDL 3.7 12/04/2015 0806   VLDL 24 12/04/2015 0806   LDLCALC 120 12/04/2015 0806      Wt Readings from Last 3 Encounters:  10/09/17 198 lb (89.8 kg)  06/02/17 195 lb (88.5 kg)  08/30/16 190 lb 6.4 oz (86.4 kg)     Cardiac Studies Reviewed: 2D echocardiogram 06/02/2014: Study Conclusions  - Left ventricle: The cavity size was normal. Wall thickness was increased in a pattern of mild LVH. Systolic function was normal. The estimated ejection fraction was in the range of 50% to 55%. Wall motion was normal; there were no regional wall motion abnormalities. Doppler parameters are consistent with  abnormal left ventricular relaxation (grade 1 diastolic dysfunction). Doppler parameters are consistent with high ventricular filling pressure. - Mitral valve: Prior procedures included surgical repair. The findings are consistent with mild stenosis.  Impressions:  - Normal LV function; grade 1 diastolic dysfunction; s/p MV repair with mild MS; no MR.  ASSESSMENT AND PLAN: 1.  Mitral valve disease: The patient is doing well after minimally invasive mitral valve repair.  I cannot appreciate a heart murmur on his exam.  He follows SBE prophylaxis as recommended.  No changes are made in his medical regimen today.  I reviewed his most recent echocardiogram.  His blood pressure is well controlled on low doses of lisinopril and metoprolol.  He takes aspirin 81 mg daily.  2.  Hyperlipidemia: He would like a trial of lifestyle modification with diet and exercise.  We will  draw lipids and LFTs in October.  Would consider ezetimibe pending evaluation of his lipids.  He has had a lot of problems tolerating a statin drug even at low dose.  Current medicines are reviewed with the patient today.  The patient does not have concerns regarding medicines.  Labs/ tests ordered today include:   Orders Placed This Encounter  Procedures  . Lipid panel  . Hepatic function panel  . EKG 12-Lead    Disposition:   FU one year  Signed, Tonny Bollman, MD  10/10/2017 9:21 PM    [facility] [address] [place], [place], [place]  [address] Phone: [phone]; Fax: [phone]

## 2017-10-31 ENCOUNTER — Telehealth: Payer: Self-pay | Admitting: Family Medicine

## 2017-10-31 NOTE — Telephone Encounter (Signed)
Needs to make cpx appt, okay to refill until then once made.

## 2017-10-31 NOTE — Telephone Encounter (Signed)
Robin,  ° °Please schedule CPE with fasting labs prior.  Must schedule appointment before I can refill his medication.  Please send back to me once appointment is made. °

## 2017-10-31 NOTE — Telephone Encounter (Signed)
Last office visit 06/02/2017 for Influenza.  Last CPE 04/08/2016.  No future appointments.  Last refilled 09/08/2017 for #30 with no refills.  Refill?

## 2017-11-01 NOTE — Telephone Encounter (Signed)
Lawrence Underwood,   Please schedule CPE with fasting labs prior.  Must schedule appointment before I can refill his medication.  Please send back to me once appointment is made.

## 2017-11-01 NOTE — Telephone Encounter (Signed)
Left message asking pt to call office  °

## 2017-11-02 NOTE — Telephone Encounter (Signed)
Left message asking pt to call office. Also sent my chart message asking pt to call office

## 2017-11-14 ENCOUNTER — Other Ambulatory Visit: Payer: Self-pay | Admitting: Family Medicine

## 2017-11-14 MED ORDER — ALLOPURINOL 100 MG PO TABS: 100.0000 mg | ORAL_TABLET | Freq: Every day | ORAL | 3 refills | Status: DC

## 2017-11-14 NOTE — Telephone Encounter (Deleted)
Please fax in refill.. Did not go in  Through EMR.

## 2017-11-14 NOTE — Addendum Note (Signed)
Addended byKerby Nora E on: 11/14/2017 02:19 PM   Modules accepted: Orders

## 2017-12-30 ENCOUNTER — Other Ambulatory Visit: Payer: Self-pay | Admitting: Physician Assistant

## 2018-01-12 ENCOUNTER — Other Ambulatory Visit: Payer: Self-pay

## 2018-05-10 ENCOUNTER — Telehealth: Payer: Self-pay | Admitting: Family Medicine

## 2018-05-10 DIAGNOSIS — R7303 Prediabetes: Secondary | ICD-10-CM

## 2018-05-10 DIAGNOSIS — M109 Gout, unspecified: Secondary | ICD-10-CM

## 2018-05-10 DIAGNOSIS — E782 Mixed hyperlipidemia: Secondary | ICD-10-CM

## 2018-05-10 NOTE — Telephone Encounter (Signed)
-----   Message from Alvina Chou sent at 05/02/2018  2:26 PM EST ----- Regarding: Lab orders for Friday, 2.21.20 Patient is scheduled for CPX labs, please order future labs, Thanks , Camelia Eng

## 2018-05-11 ENCOUNTER — Other Ambulatory Visit: Payer: BLUE CROSS/BLUE SHIELD

## 2018-05-11 ENCOUNTER — Telehealth: Payer: Self-pay

## 2018-05-11 NOTE — Telephone Encounter (Signed)
[facility] Night - Client Nonclinical Telephone Record [facility] Medical Call Center Client [facility] Night - Client Client Site [facility] [place] - Night Contact Type Call Who Is Calling Patient / Member / Family / Caregiver Caller Name Logan Bores Caller Phone Number [phone] Call Type Message Only Information Provided Reason for Call Returning a Call from the Office Initial Comment Caller States he is returing a call from the office. Additional Comment Call Closed By: Eldridge Abrahams Transaction Date/Time: 05/10/2018 5:36:03 PM (ET)

## 2018-05-11 NOTE — Telephone Encounter (Signed)
Spoke with pt  He stated he was confirming his appointment for tuesday2/25

## 2018-05-15 ENCOUNTER — Encounter: Payer: Self-pay | Admitting: Family Medicine

## 2018-05-15 ENCOUNTER — Ambulatory Visit (INDEPENDENT_AMBULATORY_CARE_PROVIDER_SITE_OTHER): Payer: BLUE CROSS/BLUE SHIELD | Admitting: Family Medicine

## 2018-05-15 VITALS — BP 130/80 | HR 88 | Temp 98.1°F | Resp 12 | Ht 70.0 in | Wt 199.5 lb

## 2018-05-15 DIAGNOSIS — R7303 Prediabetes: Secondary | ICD-10-CM | POA: Diagnosis not present

## 2018-05-15 DIAGNOSIS — M109 Gout, unspecified: Secondary | ICD-10-CM

## 2018-05-15 DIAGNOSIS — E782 Mixed hyperlipidemia: Secondary | ICD-10-CM | POA: Diagnosis not present

## 2018-05-15 DIAGNOSIS — Z1211 Encounter for screening for malignant neoplasm of colon: Secondary | ICD-10-CM

## 2018-05-15 DIAGNOSIS — Z0000 Encounter for general adult medical examination without abnormal findings: Secondary | ICD-10-CM | POA: Diagnosis not present

## 2018-05-15 NOTE — Assessment & Plan Note (Signed)
No flares. Review work labs.

## 2018-05-15 NOTE — Assessment & Plan Note (Signed)
Will eval labs from work to see if A1C done.

## 2018-05-15 NOTE — Progress Notes (Signed)
Subjective:    Patient ID: Lawrence Underwood, male    DOB: 1965/05/13, 53 y.o.   MRN: [id number]  HPI   The patient is here for annual wellness exam and preventative care.    Hx of CAD and mitral valve repair per Dr. Excell Seltzer  Elevated Cholesterol: due for re-eval ( had done at work). Felt terrible on crestor.Marland Kitchen achy body, nervousness.. he feels much better off it. Using medications without problems: Muscle aches:  Diet compliance: good healthy heart diet Exercise: jogging 2-3 times a week Other complaints:   prediabetes : due for re-eval.  Gout  On allopurinol preventatively.   BP control ood on lisinopril and metoprolol.  Social History /Family History/Past Medical History reviewed in detail and updated in EMR if needed. Blood pressure 130/80, pulse 88, temperature 98.1 F (36.7 C), temperature source Oral, resp. rate 12, height 5\' 10"  (1.778 m), weight 199 lb 8 oz (90.5 kg), SpO2 97 %.   Wt Readings from Last 3 Encounters:  05/15/18 199 lb 8 oz (90.5 kg)  10/09/17 198 lb (89.8 kg)  06/02/17 195 lb (88.5 kg)  Body mass index is 28.63 kg/m.   Review of Systems  Constitutional: Negative for fatigue and fever.  HENT: Negative for ear pain.   Eyes: Negative for pain.  Respiratory: Negative for cough and shortness of breath.   Cardiovascular: Negative for chest pain, palpitations and leg swelling.  Gastrointestinal: Negative for abdominal pain.  Genitourinary: Negative for dysuria.  Musculoskeletal: Negative for arthralgias.  Neurological: Negative for syncope, light-headedness and headaches.  Psychiatric/Behavioral: Negative for dysphoric mood.       Objective:   Physical Exam Constitutional:      General: He is not in acute distress.    Appearance: Normal appearance. He is well-developed. He is not ill-appearing or toxic-appearing.  HENT:     Head: Normocephalic and atraumatic.     Right Ear: Hearing, tympanic membrane, ear canal and external ear normal.     Left  Ear: Hearing, tympanic membrane, ear canal and external ear normal.     Nose: Nose normal.     Mouth/Throat:     Pharynx: Uvula midline.  Eyes:     General: Lids are normal. Lids are everted, no foreign bodies appreciated.     Conjunctiva/sclera: Conjunctivae normal.     Pupils: Pupils are equal, round, and reactive to light.  Neck:     Musculoskeletal: Normal range of motion and neck supple.     Thyroid: No thyroid mass or thyromegaly.     Vascular: No carotid bruit.     Trachea: Trachea and phonation normal.  Cardiovascular:     Rate and Rhythm: Normal rate and regular rhythm.     Pulses: Normal pulses.     Heart sounds: S1 normal and S2 normal. No murmur. No gallop.   Pulmonary:     Breath sounds: Normal breath sounds. No wheezing, rhonchi or rales.  Abdominal:     General: Bowel sounds are normal.     Palpations: Abdomen is soft.     Tenderness: There is no abdominal tenderness. There is no guarding or rebound.     Hernia: No hernia is present.  Lymphadenopathy:     Cervical: No cervical adenopathy.  Skin:    General: Skin is warm and dry.     Findings: No rash.  Neurological:     Mental Status: He is alert.     Cranial Nerves: No cranial nerve deficit.     Sensory:  No sensory deficit.     Gait: Gait normal.     Deep Tendon Reflexes: Reflexes are normal and symmetric.  Psychiatric:        Speech: Speech normal.        Behavior: Behavior normal.        Judgment: Judgment normal.           Assessment & Plan:  The patient's preventative maintenance and recommended screening tests for an annual wellness exam were reviewed in full today. Brought up to date unless services declined.  Counselled on the importance of diet, exercise, and its role in overall health and mortality. The patient's FH and SH was reviewed, including their home life, tobacco status, and drug and alcohol status.   Vaccines: uptodate, flu done at [facility] in fall Prostate Cancer Screen: due for eval  PSA  Colon Cancer Screen:   refer to colonoscopy      Smoking Status:none ETOH/ drug use:  ETOH on weekends/none HIV screen:  refused.

## 2018-05-15 NOTE — Patient Instructions (Addendum)
Make sure you have done  PSA, CMET, lipids, uric acid, A1C with labs at work. We will call to set up colonscopy.  Bring in lab results from work please.  Keep working on heart healthy diet.

## 2018-05-15 NOTE — Assessment & Plan Note (Addendum)
Pt will bring in labs from work. Likely needs to restart crestor. Encouraged exercise, weight loss, healthy eating habits.

## 2018-05-25 ENCOUNTER — Telehealth: Payer: Self-pay

## 2018-05-25 NOTE — Telephone Encounter (Signed)
Left message for patient to call back in regards to referral.

## 2018-05-29 NOTE — Telephone Encounter (Signed)
Mr. Prindle wants to know if it is safe to fly with his heart condition and coronavirus. Spoke with him in great detail about traveling during epidemic. Reiterated to him that from a cardiac perspective, he is fine to fly, but it is ultimately his decision if he wants to risk coronavirus while traveling. He understands that people most negatively affected by the virus are people over 60 with HTN and DM. Reiterated to him that the virus has spread quickly so available research is limited.  Reiterated to him that [facility] and CDC are continuously updating their information for review. Reassured him that staff at [facility] is trying to stay as up-to-date as possible. He was grateful for assistance.

## 2018-06-04 MED ORDER — ROSUVASTATIN CALCIUM 5 MG PO TABS: 5.0000 mg | ORAL_TABLET | ORAL | 3 refills | Status: DC

## 2018-09-05 ENCOUNTER — Encounter: Payer: Self-pay | Admitting: Gastroenterology

## 2018-09-20 ENCOUNTER — Other Ambulatory Visit: Payer: Self-pay

## 2018-09-20 ENCOUNTER — Ambulatory Visit: Payer: BC Managed Care – PPO

## 2018-09-20 VITALS — Ht 70.0 in | Wt 172.0 lb

## 2018-09-20 DIAGNOSIS — Z1211 Encounter for screening for malignant neoplasm of colon: Secondary | ICD-10-CM

## 2018-09-20 MED ORDER — SUPREP BOWEL PREP KIT 17.5-3.13-1.6 GM/177ML PO SOLN: 1.0000 | Freq: Once | ORAL | 0 refills | Status: AC

## 2018-09-20 NOTE — Progress Notes (Signed)
Per pt, no allergies to soy or egg products.Pt not taking any weight loss meds or using  O2 at home. Pt denies any sedation problems.   Pt refused emmi video. The PV was done over the phone due to COVID-19. Verified pt's address and insurance. Reviewed medical hx and prep instructions with pt and will mail paperwork to the pt for review. Informed pt to call with any questions or changes prior to his procedure. Pt understood.

## 2018-09-25 ENCOUNTER — Telehealth: Payer: Self-pay | Admitting: *Deleted

## 2018-09-25 ENCOUNTER — Other Ambulatory Visit: Payer: Self-pay

## 2018-09-25 DIAGNOSIS — Z20822 Contact with and (suspected) exposure to covid-19: Secondary | ICD-10-CM

## 2018-09-25 DIAGNOSIS — R6889 Other general symptoms and signs: Secondary | ICD-10-CM | POA: Diagnosis not present

## 2018-09-25 NOTE — Telephone Encounter (Signed)
Per DPR, spoke with patient's wife Mindy.  Scheduled patient for COVID 19 test today at [facility] at 2:30 pm.  Testing protocol reviewed.

## 2018-09-25 NOTE — Telephone Encounter (Signed)
-----   Message from Jinny Sanders, MD sent at 09/24/2018  1:08 PM EDT ----- Lawrence Underwood  DOB [birth year] [id number]  Re: Exposure to large group event/airport.. currently asymptomatic [employer] ID [id number]

## 2018-09-29 LAB — NOVEL CORONAVIRUS, NAA: SARS-CoV-2, NAA: NOT DETECTED

## 2018-10-04 ENCOUNTER — Telehealth: Payer: Self-pay | Admitting: Gastroenterology

## 2018-10-04 NOTE — Telephone Encounter (Signed)
Pt is scheduled for colon tomorrow at 1:30pm, He stated that he forgot that he could not have any solid food today so he ate one boiled egg around 9:30am. He wants to know if he can still have procedure. Pls call him.

## 2018-10-04 NOTE — Telephone Encounter (Signed)
Left message to call back to ask Covid-19 screening questions. °Covid-19 Screening Questions: ° °Do you now or have you had a fever in the last 14 days? no ° °Do you have any respiratory symptoms of shortness of breath or cough now or in the last 14 days? no ° °Do you have any family members or close contacts with diagnosed or suspected Covid-19 in the past 14 days? no ° °Have you been tested for Covid-19 and found to be positive?  °Yes, Negative ° °Pt made aware of that care partner may wait in the car or come up to the lobby during the procedure but will need to provide their own mask. °

## 2018-10-04 NOTE — Telephone Encounter (Signed)
Returned pts call and spoke with patient.  Advised him that he should be fine as long as he stays on clear liquids for the remainder of the day and tomorrow.  Pt verbalized understanding.

## 2018-10-05 ENCOUNTER — Ambulatory Visit (AMBULATORY_SURGERY_CENTER): Payer: BC Managed Care – PPO | Admitting: Gastroenterology

## 2018-10-05 ENCOUNTER — Other Ambulatory Visit: Payer: Self-pay

## 2018-10-05 ENCOUNTER — Encounter: Payer: Self-pay | Admitting: Gastroenterology

## 2018-10-05 VITALS — BP 109/76 | HR 61 | Temp 98.5°F | Resp 15 | Ht 70.0 in | Wt 172.0 lb

## 2018-10-05 DIAGNOSIS — Z1211 Encounter for screening for malignant neoplasm of colon: Secondary | ICD-10-CM | POA: Diagnosis not present

## 2018-10-05 MED ORDER — SODIUM CHLORIDE 0.9 % IV SOLN: 500.0000 mL | Freq: Once | INTRAVENOUS | Status: DC

## 2018-10-05 NOTE — Op Note (Signed)
[facility] Patient Name: Lawrence Underwood Knights Procedure Date: 10/05/2018 2:17 PM MRN: [id number] Endoscopist: Tressia DanasKimberly Clovia Reine MD, MD Age: [age] Referring MD:  Date of Birth: 12/29/1965 Gender: Male Account #: [id number] Procedure:                Colonoscopy Indications:              Screening for colorectal malignant neoplasm, This                            is the patient's first colonoscopy                           No known family history of colon cancer or polyps                           No baseline GI symptoms Medicines:                See the Anesthesia note for documentation of the                            administered medications Procedure:                Pre-Anesthesia Assessment:                           - Prior to the procedure, a History and Physical                            was performed, and patient medications and                            allergies were reviewed. The patient's tolerance of                            previous anesthesia was also reviewed. The risks                            and benefits of the procedure and the sedation                            options and risks were discussed with the patient.                            All questions were answered, and informed consent                            was obtained. Prior Anticoagulants: The patient has                            taken no previous anticoagulant or antiplatelet                            agents. ASA Grade Assessment: II - A patient with  mild systemic disease. After reviewing the risks                            and benefits, the patient was deemed in                            satisfactory condition to undergo the procedure.                           After obtaining informed consent, the colonoscope                            was passed under direct vision. Throughout the                            procedure, the patient's blood pressure, pulse, and                          oxygen saturations were monitored continuously. The                            Colonoscope was introduced through the anus and                            advanced to the the terminal ileum, with                            identification of the appendiceal orifice and IC                            valve. A second forward view of the right colon was                            performed. The colonoscopy was performed without                            difficulty. The patient tolerated the procedure                            well. The quality of the bowel preparation was                            good. The terminal ileum, ileocecal valve,                            appendiceal orifice, and rectum were photographed. Scope In: 2:27:16 PM Scope Out: 2:47:45 PM Scope Withdrawal Time: 0 hours 17 minutes 20 seconds  Total Procedure Duration: 0 hours 20 minutes 29 seconds  Findings:                 The perianal and digital rectal examinations were                            normal. Small internal hemorrhoids are present.  A few small-mouthed diverticula were found in the                            sigmoid colon and descending colon.                           The colon (entire examined portion) appeared normal.                           The exam was otherwise without abnormality on                            direct and retroflexion views. Complications:            No immediate complications. Estimated blood loss:                            Minimal. Estimated Blood Loss:     Estimated blood loss was minimal. Impression:               - Diverticulosis in the sigmoid colon and in the                            descending colon.                           - The entire examined colon is normal except for                            small internal hemorrhoids.                           - The examination was otherwise normal on direct                             and retroflexion views.                           - No specimens collected. Recommendation:           - Patient has a contact number available for                            emergencies. The signs and symptoms of potential                            delayed complications were discussed with the                            patient. Return to normal activities tomorrow.                            Written discharge instructions were provided to the                            patient.                           -  Resume regular diet today.                           - Continue present medications.                           - Repeat colonoscopy in 10 years for screening                            purposes. Consider an earlier exam with the                            development of any new symptoms. Tressia Danas MD, MD 10/05/2018 2:53:49 PM This report has been signed electronically.

## 2018-10-05 NOTE — Patient Instructions (Signed)
YOU HAD AN ENDOSCOPIC PROCEDURE TODAY AT THE [facility]:   Refer to the procedure report that was given to you for any specific questions about what was found during the examination.  If the procedure report does not answer your questions, please call your gastroenterologist to clarify.  If you requested that your care partner not be given the details of your procedure findings, then the procedure report has been included in a sealed envelope for you to review at your convenience later.  YOU SHOULD EXPECT: Some feelings of bloating in the abdomen. Passage of more gas than usual.  Walking can help get rid of the air that was put into your GI tract during the procedure and reduce the bloating. If you had a lower endoscopy (such as a colonoscopy or flexible sigmoidoscopy) you may notice spotting of blood in your stool or on the toilet paper. If you underwent a bowel prep for your procedure, you may not have a normal bowel movement for a few days.  Please Note:  You might notice some irritation and congestion in your nose or some drainage.  This is from the oxygen used during your procedure.  There is no need for concern and it should clear up in a day or so.  SYMPTOMS TO REPORT IMMEDIATELY:   Following lower endoscopy (colonoscopy or flexible sigmoidoscopy):  Excessive amounts of blood in the stool  Significant tenderness or worsening of abdominal pains  Swelling of the abdomen that is new, acute  Fever of 100F or higher  For urgent or emergent issues, a gastroenterologist can be reached at any hour by calling [phone].   DIET:  We do recommend a small meal at first, but then you may proceed to your regular diet.  Drink plenty of fluids but you should avoid alcoholic beverages for 24 hours.  ACTIVITY:  You should plan to take it easy for the rest of today and you should NOT DRIVE or use heavy machinery until tomorrow (because of the sedation medicines used during the test).     FOLLOW UP: Our staff will call the number listed on your records 48-72 hours following your procedure to check on you and address any questions or concerns that you may have regarding the information given to you following your procedure. If we do not reach you, we will leave a message.  We will attempt to reach you two times.  During this call, we will ask if you have developed any symptoms of COVID 19. If you develop any symptoms (ie: fever, flu-like symptoms, shortness of breath, cough etc.) before then, please call [phone].  If you test positive for Covid 19 in the 2 weeks post procedure, please call and report this information to us.    If any biopsies were taken you will be contacted by phone or by letter within the next 1-3 weeks.  Please call us at [phone] if you have not heard about the biopsies in 3 weeks.    SIGNATURES/CONFIDENTIALITY: You and/or your care partner have signed paperwork which will be entered into your electronic medical record.  These signatures attest to the fact that that the information above on your After Visit Summary has been reviewed and is understood.  Full responsibility of the confidentiality of this discharge information lies with you and/or your care-partner. 

## 2018-10-05 NOTE — Progress Notes (Signed)
Pt's states no medical or surgical changes since previsit or office visit.  Temp-courtney Vital signs-judy branson

## 2018-10-05 NOTE — Progress Notes (Signed)
A and O x3. Report to RN. Tolerated MAC anesthesia well.

## 2018-10-09 ENCOUNTER — Other Ambulatory Visit: Payer: Self-pay | Admitting: Physician Assistant

## 2018-10-09 ENCOUNTER — Other Ambulatory Visit: Payer: Self-pay

## 2018-10-09 ENCOUNTER — Other Ambulatory Visit: Payer: Self-pay | Admitting: *Deleted

## 2018-10-09 ENCOUNTER — Telehealth: Payer: Self-pay

## 2018-10-09 ENCOUNTER — Other Ambulatory Visit: Payer: BC Managed Care – PPO

## 2018-10-09 DIAGNOSIS — E782 Mixed hyperlipidemia: Secondary | ICD-10-CM | POA: Diagnosis not present

## 2018-10-09 MED ORDER — LISINOPRIL 2.5 MG PO TABS: 2.5000 mg | ORAL_TABLET | Freq: Every day | ORAL | 0 refills | Status: DC

## 2018-10-09 NOTE — Telephone Encounter (Signed)
  Follow up Call-  Call back number 10/05/2018  Post procedure Call Back phone  # [phone]  Permission to leave phone message Yes  Some recent data might be hidden     Patient questions:  Do you have a fever, pain , or abdominal swelling? No. Pain Score  0 *  Have you tolerated food without any problems? Yes.    Have you been able to return to your normal activities? Yes.    Do you have any questions about your discharge instructions: Diet   No. Medications  No. Follow up visit  No.  Do you have questions or concerns about your Care? No.  Actions: * If pain score is 4 or above: 1. No action needed, pain <4.Have you developed a fever since your procedure? no  2.   Have you had an respiratory symptoms (SOB or cough) since your procedure? no  3.   Have you tested positive for COVID 19 since your procedure no  4.   Have you had any family members/close contacts diagnosed with the COVID 19 since your procedure?  no   If yes to any of these questions please route to Joylene John, RN and Alphonsa Gin, Therapist, sports.

## 2018-10-09 NOTE — Telephone Encounter (Signed)
He should have   CMET  Fasting Lipids Richardson Dopp, PA-C    10/09/2018 1:04 PM

## 2018-10-10 LAB — COMPREHENSIVE METABOLIC PANEL
ALT: 26 IU/L (ref 0–44)
AST: 27 IU/L (ref 0–40)
Albumin/Globulin Ratio: 2.3 — ABNORMAL HIGH (ref 1.2–2.2)
Albumin: 4.6 g/dL (ref 4.0–5.0)
Alkaline Phosphatase: 64 IU/L (ref 39–117)
BUN/Creatinine Ratio: 12 (ref 9–20)
BUN: 12 mg/dL (ref 6–24)
Bilirubin Total: 0.7 mg/dL (ref 0.0–1.2)
CO2: 23 mmol/L (ref 20–29)
Calcium: 9.1 mg/dL (ref 8.7–10.2)
Chloride: 105 mmol/L (ref 96–106)
Creatinine, Ser: 0.97 mg/dL (ref 0.76–1.27)
GFR calc Af Amer: 105 mL/min/{1.73_m2} (ref >59)
GFR calc non Af Amer: 91 mL/min/{1.73_m2} (ref >59)
Globulin, Total: 2 g/dL (ref 1.5–4.5)
Glucose: 82 mg/dL (ref 65–99)
Potassium: 4.1 mmol/L (ref 3.5–5.2)
Sodium: 143 mmol/L (ref 134–144)
Total Protein: 6.6 g/dL (ref 6.0–8.5)

## 2018-10-10 LAB — LIPID PANEL
Chol/HDL Ratio: 2.9 ratio (ref 0.0–5.0)
Cholesterol, Total: 168 mg/dL (ref 100–199)
HDL: 58 mg/dL (ref >39)
LDL Calculated: 91 mg/dL (ref 0–99)
Triglycerides: 97 mg/dL (ref 0–149)
VLDL Cholesterol Cal: 19 mg/dL (ref 5–40)

## 2018-10-15 ENCOUNTER — Telehealth: Payer: Self-pay | Admitting: Physician Assistant

## 2018-10-15 NOTE — Telephone Encounter (Signed)
New Message ° ° ° ° °Left message to confirm appt and answer Covid Questions  °

## 2018-10-16 ENCOUNTER — Other Ambulatory Visit: Payer: Self-pay

## 2018-10-16 ENCOUNTER — Ambulatory Visit: Payer: BC Managed Care – PPO | Admitting: Physician Assistant

## 2018-10-16 ENCOUNTER — Encounter: Payer: Self-pay | Admitting: Physician Assistant

## 2018-10-16 VITALS — BP 108/62 | HR 71 | Ht 70.0 in | Wt 165.4 lb

## 2018-10-16 DIAGNOSIS — I2510 Atherosclerotic heart disease of native coronary artery without angina pectoris: Secondary | ICD-10-CM | POA: Diagnosis not present

## 2018-10-16 DIAGNOSIS — Z9889 Other specified postprocedural states: Secondary | ICD-10-CM | POA: Diagnosis not present

## 2018-10-16 NOTE — Progress Notes (Signed)
Cardiology Office Note:    Date:  10/16/2018   ID:  Lawrence Underwood, DOB May 01, 1965, MRN [id number]  PCP:  Jinny Sanders, MD  Cardiologist:  Sherren Mocha, MD   Electrophysiologist:  None   Referring MD: Jinny Sanders, MD   Chief Complaint  Patient presents with  . Follow-up    hx of MV Repair    History of Present Illness:    Lawrence Underwood is a 53 y.o. male with:  MVP with mitral regurgitation   S/p min invasive mitral valve repair 09/2013  Non-obs Coronary artery disease   Hyperlipidemia  Lawrence Underwood returns for follow-up.  He is here alone.  Since last seen, he has done well.  He has lost a significant amount of weight and feels good.  He has not had chest pain, shortness of breath, syncope, PND, leg swelling.  Prior CV studies:   The following studies were reviewed today:  Echo 3/16 Mild LVH, EF 50-55%, normal wall motion, grade 1 diastolic dysfunction, MV repair with mild MS (mean 6 mmHg) and no MR  Carotid US 7/15 Bilateral ICA 1-39%  LHC 7/15 LM patent LAD patent LCx patent RCA mid 40% Vigorous LVEF, EF 65%, severe mitral regurgitation   Past Medical History:  Diagnosis Date  . Coronary artery disease 09/18/2013   40% stenosis RCA and otherwise non-obstructive CAD   . Dizziness    r/t mitral valve  . GERD (gastroesophageal reflux disease)    no meds  . Gout    but doesn't take any meds for this  . H/O hiatal hernia   . History of bronchitis 2 yrs ago  . Hyperlipidemia   . Hypertension   . Mitral regurgitation due to cusp prolapse 08/26/2013  . 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   Complex valvuloplasty including triangular resection of flail posterior leaflet, artificial Gore-tex neocord placement x4, Sorin Memo 3D ring annuloplasty (size 34 mm) via right mini thoracotomy approach   Surgical Hx: The patient  has a past surgical history that includes Hand surgery; Knee surgery (Left); TEE without cardioversion (N/A,  08/26/2013); Wisdom tooth extraction; Cardiac catheterization (09/18/13); Mitral valve repair (Right, 10/11/2013); Intraoprative transesophageal echocardiogram (N/A, 10/11/2013); and left and right heart catheterization with coronary angiogram (N/A, 09/18/2013).   Current Medications: Current Meds  Medication Sig  . allopurinol (ZYLOPRIM) 100 MG tablet Take 1 tablet (100 mg total) by mouth daily.  Marland Kitchen aspirin EC 81 MG tablet Take 1 tablet (81 mg total) by mouth daily.  Marland Kitchen lisinopril (ZESTRIL) 2.5 MG tablet Take 1 tablet (2.5 mg total) by mouth daily. Please keep upcoming appt in July for future refills. Thank you  . metoprolol tartrate (LOPRESSOR) 25 MG tablet Take 0.5 tablets (12.5 mg total) by mouth 2 (two) times daily. Please keep upcoming appt in July for future refills. Thank you  . rosuvastatin (CRESTOR) 5 MG tablet Take 1 tablet (5 mg total) by mouth 3 (three) times a week.   Current Facility-Administered Medications for the 10/16/18 encounter (Office Visit) with Richardson Dopp T, PA-C  Medication  . 0.9 %  sodium chloride infusion     Allergies:   Lisinopril   Social History   Tobacco Use  . Smoking status: Never Smoker  . Smokeless tobacco: Never Used  Substance Use Topics  . Alcohol use: Yes    Alcohol/week: 3.0 - 4.0 standard drinks    Types: 3 - 4 Standard drinks or equivalent per week    Comment: weekends  .  Drug use: No     Family Hx: The patient's family history includes CVA (age of onset: 54) in his father; Gout in his father, maternal grandfather, maternal grandmother, paternal grandfather, and paternal grandmother; Healthy in his sister and sister; Heart attack (age of onset: 36) in his father; Hyperlipidemia in his mother.  ROS:   Please see the history of present illness.    ROS All other systems reviewed and are negative.   EKGs/Labs/Other Test Reviewed:    EKG:  EKG is ordered today.  The ekg ordered today demonstrates sinus rhythm, heart rate 71, normal axis, QTC  428, no acute changes   Recent Labs: 10/09/2018: ALT 26; BUN 12; Creatinine, Ser 0.97; Potassium 4.1; Sodium 143   Recent Lipid Panel Lab Results  Component Value Date/Time   CHOL 168 10/09/2018 01:26 PM   TRIG 97 10/09/2018 01:26 PM   HDL 58 10/09/2018 01:26 PM   CHOLHDL 2.9 10/09/2018 01:26 PM   CHOLHDL 3.7 12/04/2015 08:06 AM   LDLCALC 91 10/09/2018 01:26 PM    Physical Exam:    VS:  BP 108/62   Pulse 71   Ht 5\' 10"  (1.778 m)   Wt 165 lb 6.4 oz (75 kg)   SpO2 97%   BMI 23.73 kg/m     Wt Readings from Last 3 Encounters:  10/16/18 165 lb 6.4 oz (75 kg)  10/05/18 172 lb (78 kg)  09/20/18 172 lb (78 kg)     Physical Exam  Constitutional: He is oriented to person, place, and time. He appears well-developed and well-nourished. No distress.  HENT:  Head: Normocephalic and atraumatic.  Neck: Neck supple. No JVD present.  Cardiovascular: Normal rate, regular rhythm, S1 normal and S2 normal.  No murmur heard. Pulmonary/Chest: Breath sounds normal. He has no rales.  Abdominal: Soft. There is no hepatomegaly.  Musculoskeletal:        General: No edema.  Neurological: He is alert and oriented to person, place, and time.  Skin: Skin is warm and dry.    ASSESSMENT & PLAN:    1. S/P minimally invasive mitral valve repair History of mitral valve repair in 2015.  Echocardiogram in 2016 with normally functioning mitral valve repair.  He is doing well without symptoms.  Arrange follow-up echo in 1 year, 1 week prior to his next visit.  Continue SBE prophylaxis.  2. Coronary artery disease involving native coronary artery of native heart without angina pectoris Minimal CAD at cardiac catheterization prior to mitral valve surgery.  Continue aspirin, statin.  Recent lipid panel optimal.  Dispo:  Return in about 1 year (around 10/16/2019) for Routine Follow Up, w/ Dr. Excell Seltzer, or Tereso Newcomer, PA-C.   Medication Adjustments/Labs and Tests Ordered: Current medicines are reviewed at  length with the patient today.  Concerns regarding medicines are outlined above.  Tests Ordered: Orders Placed This Encounter  Procedures  . EKG 12-Lead  . ECHOCARDIOGRAM COMPLETE   Medication Changes: No orders of the defined types were placed in this encounter.   Signed, Tereso Newcomer, PA-C  10/16/2018 5:05 PM    [facility] [address] [place], [place], [place]  [address] Phone: [phone]; Fax: [phone]

## 2018-10-16 NOTE — Patient Instructions (Signed)
Medication Instructions:  Your physician recommends that you continue on your current medications as directed. Please refer to the Current Medication list given to you today.  If you need a refill on your cardiac medications before your next appointment, please call your pharmacy.   Lab work: NONE If you have labs (blood work) drawn today and your tests are completely normal, you will receive your results only by: Marland Kitchen MyChart Message (if you have MyChart) OR . A paper copy in the mail If you have any lab test that is abnormal or we need to change your treatment, we will call you to review the results.  Testing/Procedures: Your physician has requested that you have an echocardiogram. Echocardiography is a painless test that uses sound waves to create images of your heart. It provides your doctor with information about the size and shape of your heart and how well your heart's chambers and valves are working. This procedure takes approximately one hour. There are no restrictions for this procedure.  Follow-Up: At [facility], you and your health needs are our priority.  As part of our continuing mission to provide you with exceptional heart care, we have created designated Provider Care Teams.  These Care Teams include your primary Cardiologist (physician) and Advanced Practice Providers (APPs -  Physician Assistants and Nurse Practitioners) who all work together to provide you with the care you need, when you need it. You will need a follow up appointment in:  12 months.  Please call our office 2 months in advance to schedule this appointment.  You may see Tonny Bollman, MD or Tereso Newcomer, PA-C   Any Other Special Instructions Will Be Listed Below (If Applicable).   Echocardiogram An echocardiogram is a procedure that uses painless sound waves (ultrasound) to produce an image of the heart. Images from an echocardiogram can provide important information about:  Signs of coronary artery  disease (CAD).  Aneurysm detection. An aneurysm is a weak or damaged part of an artery wall that bulges out from the normal force of blood pumping through the body.  Heart size and shape. Changes in the size or shape of the heart can be associated with certain conditions, including heart failure, aneurysm, and CAD.  Heart muscle function.  Heart valve function.  Signs of a past heart attack.  Fluid buildup around the heart.  Thickening of the heart muscle.  A tumor or infectious growth around the heart valves. Tell a health care provider about:  Any allergies you have.  All medicines you are taking, including vitamins, herbs, eye drops, creams, and over-the-counter medicines.  Any blood disorders you have.  Any surgeries you have had.  Any medical conditions you have.  Whether you are pregnant or may be pregnant. What are the risks? Generally, this is a safe procedure. However, problems may occur, including:  Allergic reaction to dye (contrast) that may be used during the procedure. What happens before the procedure? No specific preparation is needed. You may eat and drink normally. What happens during the procedure?   An IV tube may be inserted into one of your veins.  You may receive contrast through this tube. A contrast is an injection that improves the quality of the pictures from your heart.  A gel will be applied to your chest.  A wand-like tool (transducer) will be moved over your chest. The gel will help to transmit the sound waves from the transducer.  The sound waves will harmlessly bounce off of your heart  to allow the heart images to be captured in real-time motion. The images will be recorded on a computer. The procedure may vary among health care providers and hospitals. What happens after the procedure?  You may return to your normal, everyday life, including diet, activities, and medicines, unless your health care provider tells you not to do that.  Summary  An echocardiogram is a procedure that uses painless sound waves (ultrasound) to produce an image of the heart.  Images from an echocardiogram can provide important information about the size and shape of your heart, heart muscle function, heart valve function, and fluid buildup around your heart.  You do not need to do anything to prepare before this procedure. You may eat and drink normally.  After the echocardiogram is completed, you may return to your normal, everyday life, unless your health care provider tells you not to do that. This information is not intended to replace advice given to you by your health care provider. Make sure you discuss any questions you have with your health care provider. Document Released: 03/04/2000 Document Revised: 06/28/2018 Document Reviewed: 04/09/2016 Elsevier Patient Education  2020 [employer].

## 2018-10-16 NOTE — Telephone Encounter (Signed)

## 2018-11-17 ENCOUNTER — Other Ambulatory Visit: Payer: Self-pay | Admitting: Family Medicine

## 2019-01-08 ENCOUNTER — Other Ambulatory Visit: Payer: Self-pay | Admitting: Physician Assistant

## 2019-04-23 ENCOUNTER — Other Ambulatory Visit: Payer: Self-pay | Admitting: Cardiovascular Disease

## 2019-05-28 ENCOUNTER — Telehealth: Payer: Self-pay | Admitting: Family Medicine

## 2019-05-28 NOTE — Telephone Encounter (Signed)
Left message asking pt to call office  °

## 2019-05-28 NOTE — Telephone Encounter (Signed)
Please schedule CPE with fasting labs prior with Dr. Bedsole.  

## 2019-05-29 ENCOUNTER — Ambulatory Visit: Payer: Self-pay | Attending: Internal Medicine

## 2019-05-29 DIAGNOSIS — Z20822 Contact with and (suspected) exposure to covid-19: Secondary | ICD-10-CM

## 2019-05-30 LAB — NOVEL CORONAVIRUS, NAA: SARS-CoV-2, NAA: NOT DETECTED

## 2019-06-04 NOTE — Telephone Encounter (Signed)
Appointment 3/19 

## 2019-06-05 LAB — BASIC METABOLIC PANEL
BUN: 13 (ref 4–21)
Chloride: 104 (ref 99–108)
Creatinine: 0.9 (ref 0.6–1.3)
Glucose: 97
Potassium: 4.3 (ref 3.4–5.3)
Sodium: 141 (ref 137–147)

## 2019-06-05 LAB — LIPID PANEL
Cholesterol: 148 (ref 0–200)
HDL: 70 (ref 35–70)
LDL Cholesterol: 65
LDl/HDL Ratio: 0.9
Triglycerides: 65 (ref 40–160)

## 2019-06-05 LAB — TSH: TSH: 1.37 (ref 0.41–5.90)

## 2019-06-05 LAB — CBC AND DIFFERENTIAL
HCT: 45 (ref 41–53)
Hemoglobin: 15.4 (ref 13.5–17.5)
Neutrophils Absolute: 2
Platelets: 279 (ref 150–399)
WBC: 4.3

## 2019-06-05 LAB — HEPATIC FUNCTION PANEL
ALT: 37 (ref 10–40)
AST: 35 (ref 14–40)
Alkaline Phosphatase: 72 (ref 25–125)
Bilirubin, Total: 0.7

## 2019-06-05 LAB — COMPREHENSIVE METABOLIC PANEL
Albumin: 4.8 (ref 3.5–5.0)
Calcium: 9.3 (ref 8.7–10.7)
Globulin: 2.1

## 2019-06-05 LAB — CBC: RBC: 5.17 — AB (ref 3.87–5.11)

## 2019-06-07 ENCOUNTER — Other Ambulatory Visit: Payer: Self-pay

## 2019-06-07 ENCOUNTER — Encounter: Payer: Self-pay | Admitting: Family Medicine

## 2019-06-07 ENCOUNTER — Ambulatory Visit (INDEPENDENT_AMBULATORY_CARE_PROVIDER_SITE_OTHER): Payer: BC Managed Care – PPO | Admitting: Family Medicine

## 2019-06-07 VITALS — BP 90/60 | HR 69 | Temp 97.8°F | Ht 69.5 in | Wt 158.2 lb

## 2019-06-07 DIAGNOSIS — R7303 Prediabetes: Secondary | ICD-10-CM | POA: Diagnosis not present

## 2019-06-07 DIAGNOSIS — M109 Gout, unspecified: Secondary | ICD-10-CM

## 2019-06-07 DIAGNOSIS — Z0000 Encounter for general adult medical examination without abnormal findings: Secondary | ICD-10-CM

## 2019-06-07 DIAGNOSIS — E782 Mixed hyperlipidemia: Secondary | ICD-10-CM

## 2019-06-07 DIAGNOSIS — I959 Hypotension, unspecified: Secondary | ICD-10-CM

## 2019-06-07 NOTE — Assessment & Plan Note (Signed)
With 20 lbs weight loss.Marland Kitchen BPs have dropped to low normal and pt is occasional symptomatic with dizziness... on  lowest dose ACEI and BBlocker... will run by cardiologist if they would be agreeable to D/C'ing one of these.

## 2019-06-07 NOTE — Assessment & Plan Note (Signed)
Good control on allopurinol.  

## 2019-06-07 NOTE — Patient Instructions (Addendum)
Look into PSA test at work.  Keep up healthy eating and regular exercise.

## 2019-06-07 NOTE — Progress Notes (Signed)
Chief Complaint  Patient presents with  . Annual Exam    History of Present Illness: HPI  The patient is here for annual wellness exam and preventative care.   Labs done at work. Reviewed in detail. CMET and thyroid normal, no anemia PSA not checked.  Elevated Cholesterol: At goal on crestor 5 mg  3 times a week.   T CHol 148, HDL 70, LDL 65 Using medications without problems: tolerated at low dose. Muscle aches:  yes Diet compliance: healthy Exercise: yes Other complaints: CAD Mitral valve repair in past: followed by Dr.   Eveline Keto :  Resolved.  Gout on allopurinol, uric acid 4.5. no flares of gout in last year.  Hypertension:   Low normal on lisinopril, metoproolol.  BP Readings from Last 3 Encounters:  06/07/19 90/60  10/16/18 108/62  10/05/18 109/76  Using medication without problems or lightheadedness: occ Chest pain with exertion: Edema:none Short of breath:none Average home BPs: Other issues:    HAs has been working on healthy running Wt Readings from Last 3 Encounters:  06/07/19 158 lb 4 oz (71.8 kg)  10/16/18 165 lb 6.4 oz (75 kg)  10/05/18 172 lb (78 kg)      This visit occurred during the SARS-CoV-2 public health emergency.  Safety protocols were in place, including screening questions prior to the visit, additional usage of staff PPE, and extensive cleaning of exam room while observing appropriate contact time as indicated for disinfecting solutions.   COVID 19 screen:  No recent travel or known exposure to COVID19 The patient denies respiratory symptoms of COVID 19 at this time. The importance of social distancing was discussed today.     Review of Systems  Constitutional: Negative for chills and fever.  HENT: Negative for congestion and ear pain.   Eyes: Negative for pain and redness.  Respiratory: Negative for cough and shortness of breath.   Cardiovascular: Negative for chest pain, palpitations and leg swelling.  Gastrointestinal:  Negative for abdominal pain, blood in stool, constipation, diarrhea, nausea and vomiting.  Genitourinary: Negative for dysuria.  Musculoskeletal: Negative for falls and myalgias.  Skin: Negative for rash.  Psychiatric/Behavioral: Negative for depression. The patient is not nervous/anxious.       Past Medical History:  Diagnosis Date  . Coronary artery disease 09/18/2013   40% stenosis RCA and otherwise non-obstructive CAD   . Dizziness    r/t mitral valve  . GERD (gastroesophageal reflux disease)    no meds  . Gout    but doesn't take any meds for this  . H/O hiatal hernia   . History of bronchitis 2 yrs ago  . Hyperlipidemia   . Hypertension   . Mitral regurgitation due to cusp prolapse 08/26/2013  . 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   Complex valvuloplasty including triangular resection of flail posterior leaflet, artificial Gore-tex neocord placement x4, Sorin Memo 3D ring annuloplasty (size 34 mm) via right mini thoracotomy approach    reports that he has never smoked. He has never used smokeless tobacco. He reports current alcohol use of about 3.0 - 4.0 standard drinks of alcohol per week. He reports that he does not use drugs.   Current Outpatient Medications:  .  allopurinol (ZYLOPRIM) 100 MG tablet, TAKE 1 TABLET BY MOUTH EVERY DAY, Disp: 90 tablet, Rfl: 0 .  aspirin EC 81 MG tablet, Take 1 tablet (81 mg total) by mouth daily., Disp: , Rfl:  .  lisinopril (ZESTRIL) 2.5 MG tablet, Take 1 tablet (  2.5 mg total) by mouth daily., Disp: 90 tablet, Rfl: 2 .  metoprolol tartrate (LOPRESSOR) 25 MG tablet, Take 0.5 tablets (12.5 mg total) by mouth 2 (two) times daily., Disp: 90 tablet, Rfl: 2 .  rosuvastatin (CRESTOR) 5 MG tablet, TAKE 1 TABLET (5 MG TOTAL) BY MOUTH 3 (THREE) TIMES A WEEK., Disp: 36 tablet, Rfl: 1  Current Facility-Administered Medications:  .  0.9 %  sodium chloride infusion, 500 mL, Intravenous, Once, Tressia Danas, MD    Observations/Objective: Blood pressure 90/60, pulse 69, temperature 97.8 F (36.6 C), temperature source Temporal, height 5' 9.5" (1.765 m), weight 158 lb 4 oz (71.8 kg), SpO2 98 %.  Physical Exam Constitutional:      General: He is not in acute distress.    Appearance: Normal appearance. He is well-developed. He is not ill-appearing or toxic-appearing.  HENT:     Head: Normocephalic and atraumatic.     Right Ear: Hearing, tympanic membrane, ear canal and external ear normal.     Left Ear: Hearing, tympanic membrane, ear canal and external ear normal.     Nose: Nose normal.     Mouth/Throat:     Pharynx: Uvula midline.  Eyes:     General: Lids are normal. Lids are everted, no foreign bodies appreciated.     Conjunctiva/sclera: Conjunctivae normal.     Pupils: Pupils are equal, round, and reactive to light.  Neck:     Thyroid: No thyroid mass or thyromegaly.     Vascular: No carotid bruit.     Trachea: Trachea and phonation normal.  Cardiovascular:     Rate and Rhythm: Normal rate and regular rhythm.     Pulses: Normal pulses.     Heart sounds: S1 normal and S2 normal. No murmur. No gallop.   Pulmonary:     Breath sounds: Normal breath sounds. No wheezing, rhonchi or rales.  Abdominal:     General: Bowel sounds are normal.     Palpations: Abdomen is soft.     Tenderness: There is no abdominal tenderness. There is no guarding or rebound.     Hernia: No hernia is present.  Musculoskeletal:     Cervical back: Normal range of motion and neck supple.  Lymphadenopathy:     Cervical: No cervical adenopathy.  Skin:    General: Skin is warm and dry.     Findings: No rash.  Neurological:     Mental Status: He is alert.     Cranial Nerves: No cranial nerve deficit.     Sensory: No sensory deficit.     Gait: Gait normal.     Deep Tendon Reflexes: Reflexes are normal and symmetric.  Psychiatric:        Speech: Speech normal.        Behavior: Behavior normal.        Judgment:  Judgment normal.      Assessment and Plan The patient's preventative maintenance and recommended screening tests for an annual wellness exam were reviewed in full today. Brought up to date unless services declined.  Counselled on the importance of diet, exercise, and its role in overall health and mortality. The patient's FH and SH was reviewed, including their home life, tobacco status, and drug and alcohol status.   Vaccines:uptodate, encouraged COVID vaccine Prostate Cancer Screen: PSA due.. he will return for this or have done at work. Colon Cancer Screen: 09/2018 repeat in 10 years.      Smoking Status:none ETOH/ drug WUJ:WJXB on weekends/none HIV screen:refused.  HYPERLIPIDEMIA LDL at goal on crestor  Hypotension With 20 lbs weight loss.Marland Kitchen BPs have dropped to low normal and pt is occasional symptomatic with dizziness... on  lowest dose ACEI and BBlocker... will run by cardiologist if they would be agreeable to D/C'ing one of these.  Gout Good control on allopurinol.       Eliezer Lofts, MD

## 2019-06-07 NOTE — Assessment & Plan Note (Signed)
LDL at goal on crestor. 

## 2019-06-10 NOTE — Progress Notes (Signed)
Hey Lawrence Underwood - I sent him a message and asked him to stop the ACE. If he stays low we can stop the metoprolol too.   Thx [name]

## 2019-06-11 NOTE — Telephone Encounter (Signed)
Medication list updated.

## 2019-06-13 ENCOUNTER — Ambulatory Visit: Payer: Self-pay

## 2019-06-14 ENCOUNTER — Ambulatory Visit: Payer: BC Managed Care – PPO | Attending: Internal Medicine

## 2019-06-14 DIAGNOSIS — Z23 Encounter for immunization: Secondary | ICD-10-CM

## 2019-06-14 NOTE — Progress Notes (Signed)
   Covid-19 Vaccination Clinic  Name:  Nikos Anglemyer    MRN: [id number] DOB: [birth year]  06/14/2019  Mr. Schroth was observed post Covid-19 immunization for 15 minutes without incident. He was provided with Vaccine Information Sheet and instruction to access the V-Safe system.   Mr. Lauderback was instructed to call 911 with any severe reactions post vaccine: Marland Kitchen Difficulty breathing  . Swelling of face and throat  . A fast heartbeat  . A bad rash all over body  . Dizziness and weakness   Immunizations Administered    Name Date Dose VIS Date Route   Pfizer COVID-19 Vaccine 06/14/2019  8:29 AM 0.3 mL 03/01/2019 Intramuscular   Manufacturer: [employer], [employer]   Lot: [id number]   NDC: [address]-4

## 2019-07-08 ENCOUNTER — Ambulatory Visit: Payer: BC Managed Care – PPO | Attending: Internal Medicine

## 2019-07-08 DIAGNOSIS — Z23 Encounter for immunization: Secondary | ICD-10-CM

## 2019-07-08 NOTE — Progress Notes (Signed)
   Covid-19 Vaccination Clinic  Name:  Lawrence Underwood    MRN: [id number] DOB: [birth year]  07/08/2019  Mr. [name] was observed post Covid-19 immunization for 15 minutes without incident. He was provided with Vaccine Information Sheet and instruction to access the V-Safe system.   Mr. [name] was instructed to call 911 with any severe reactions post vaccine: Marland Kitchen Difficulty breathing  . Swelling of face and throat  . A fast heartbeat  . A bad rash all over body  . Dizziness and weakness   Immunizations Administered    Name Date Dose VIS Date Route   Pfizer COVID-19 Vaccine 07/08/2019  2:09 PM 0.3 mL 05/15/2018 Intramuscular   Manufacturer: [employer], [employer]   Lot: [id number]   NDC: [address]-2

## 2019-09-09 ENCOUNTER — Other Ambulatory Visit: Payer: Self-pay | Admitting: Family Medicine

## 2019-09-19 ENCOUNTER — Other Ambulatory Visit (HOSPITAL_COMMUNITY): Payer: BC Managed Care – PPO

## 2019-09-19 ENCOUNTER — Other Ambulatory Visit: Payer: Self-pay

## 2019-09-19 ENCOUNTER — Ambulatory Visit (HOSPITAL_COMMUNITY): Payer: BC Managed Care – PPO | Attending: Cardiology

## 2019-09-19 DIAGNOSIS — Z9889 Other specified postprocedural states: Secondary | ICD-10-CM | POA: Diagnosis not present

## 2019-09-19 DIAGNOSIS — Z954 Presence of other heart-valve replacement: Secondary | ICD-10-CM | POA: Diagnosis not present

## 2019-10-03 ENCOUNTER — Encounter: Payer: Self-pay | Admitting: Physician Assistant

## 2019-10-08 ENCOUNTER — Other Ambulatory Visit: Payer: Self-pay | Admitting: Physician Assistant

## 2019-10-12 ENCOUNTER — Other Ambulatory Visit: Payer: Self-pay | Admitting: Cardiovascular Disease

## 2019-10-24 ENCOUNTER — Encounter: Payer: Self-pay | Admitting: Cardiovascular Disease

## 2019-10-24 ENCOUNTER — Ambulatory Visit: Payer: BC Managed Care – PPO | Admitting: Cardiovascular Disease

## 2019-10-24 ENCOUNTER — Other Ambulatory Visit: Payer: Self-pay

## 2019-10-24 VITALS — BP 104/66 | HR 68 | Wt 156.4 lb

## 2019-10-24 DIAGNOSIS — Z9889 Other specified postprocedural states: Secondary | ICD-10-CM

## 2019-10-24 DIAGNOSIS — I2510 Atherosclerotic heart disease of native coronary artery without angina pectoris: Secondary | ICD-10-CM | POA: Diagnosis not present

## 2019-10-24 DIAGNOSIS — E782 Mixed hyperlipidemia: Secondary | ICD-10-CM

## 2019-10-24 NOTE — Patient Instructions (Signed)
Medication Instructions:  1) DECREASE METOPROLOL to three times weekly for 2 weeks then STOP. (This has been removed from your med list already) *If you need a refill on your cardiac medications before your next appointment, please call your pharmacy*  Follow-Up: At [facility], you and your health needs are our priority.  As part of our continuing mission to provide you with exceptional heart care, we have created designated Provider Care Teams.  These Care Teams include your primary Cardiologist (physician) and Advanced Practice Providers (APPs -  Physician Assistants and Nurse Practitioners) who all work together to provide you with the care you need, when you need it. Your next appointment:   12 month(s) The format for your next appointment:   In Person Provider:   You may see Tonny Bollman, MD or one of the following Advanced Practice Providers on your designated Care Team:    Tereso Newcomer, PA-C  [employer], [place]

## 2019-10-24 NOTE — Progress Notes (Signed)
Cardiology Office Note:    Date:  10/24/2019   ID:  Lawrence Underwood, DOB 02/25/1966, MRN [id number]  PCP:  Excell Seltzer, MD  [facility] Cardiologist:  Tonny Bollman, MD  [facility] Electrophysiologist:  None   Referring MD: Excell Seltzer, MD   Chief Complaint  Patient presents with  . Follow-up    Mitral valve disease    History of Present Illness:    Lawrence Underwood is a 54 y.o. male with a hx of:   MVP with mitral regurgitation  ? S/p min invasive mitral valve repair 09/2013  Non-obs Coronary artery disease   Hyperlipidemia  The patient is here alone today.  He is doing very well with no exertional symptoms.  He denies chest pain, shortness of breath, or lightheadedness.  His blood pressure has been running lower after he has lost a good bit of weight.  He has lost weight through diet and exercise.  He is doing a lot of walking and running some 5K's.  He weighs himself every day in order to stay diligent about his weight.  Past Medical History:  Diagnosis Date  . Coronary artery disease 09/18/2013   40% stenosis RCA and otherwise non-obstructive CAD   . Dizziness    r/t mitral valve  . GERD (gastroesophageal reflux disease)    no meds  . Gout    but doesn't take any meds for this  . H/O hiatal hernia   . History of bronchitis 2 yrs ago  . History of echocardiogram    Echocardiogram 7/21: EF 55-60, no RWMA, mild conc LVH, normal RVSF, MV repair w/ trace MR, no MS (mean 3 mmHg)  . Hyperlipidemia   . Hypertension   . Mitral regurgitation due to cusp prolapse 08/26/2013  . 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   Complex valvuloplasty including triangular resection of flail posterior leaflet, artificial Gore-tex neocord placement x4, Sorin Memo 3D ring annuloplasty (size 34 mm) via right mini thoracotomy approach    Past Surgical History:  Procedure Laterality Date  . CARDIAC CATHETERIZATION  09/18/13  . HAND SURGERY     R hand  .  INTRAOPERATIVE TRANSESOPHAGEAL ECHOCARDIOGRAM N/A 10/11/2013   Procedure: INTRAOPERATIVE TRANSESOPHAGEAL ECHOCARDIOGRAM;  Surgeon: Purcell Nails, MD;  Location: [facility] OR;  Service: Open Heart Surgery;  Laterality: N/A;  . KNEE SURGERY Left    arthroscopy  . LEFT AND RIGHT HEART CATHETERIZATION WITH CORONARY ANGIOGRAM N/A 09/18/2013   Procedure: LEFT AND RIGHT HEART CATHETERIZATION WITH CORONARY ANGIOGRAM;  Surgeon: Micheline Chapman, MD;  Location: [facility] CATH LAB;  Service: Cardiovascular;  Laterality: N/A;  . MITRAL VALVE REPAIR Right 10/11/2013   Procedure: MINIMALLY INVASIVE MITRAL VALVE REPAIR (MVR);  Surgeon: Purcell Nails, MD;  Location: [facility] OR;  Service: Open Heart Surgery;  Laterality: Right;  . TEE WITHOUT CARDIOVERSION N/A 08/26/2013   Procedure: TRANSESOPHAGEAL ECHOCARDIOGRAM (TEE);  Surgeon: [name], MD;  Location: [facility] ENDOSCOPY;  Service: Cardiovascular;  Laterality: N/A;  . WISDOM TOOTH EXTRACTION      Current Medications: Current Meds  Medication Sig  . allopurinol (ZYLOPRIM) 100 MG tablet TAKE 1 TABLET BY MOUTH EVERY DAY  . aspirin EC 81 MG tablet Take 1 tablet (81 mg total) by mouth daily.  . rosuvastatin (CRESTOR) 5 MG tablet Take 1 tablet (5 mg total) by mouth 3 (three) times a week. Please keep upcoming appt in August with Dr. Excell Seltzer before anymore refills. Thank you  . [DISCONTINUED] metoprolol tartrate (LOPRESSOR) 25  MG tablet TAKE 0.5 TABLETS (12.5 MG TOTAL) BY MOUTH 2 (TWO) TIMES DAILY.   Current Facility-Administered Medications for the 10/24/19 encounter (Office Visit) with Tonny Bollman, MD  Medication  . 0.9 %  sodium chloride infusion     Allergies:   Lisinopril   Social History   Socioeconomic History  . Marital status: Married    Spouse name: Not on file  . Number of children: Not on file  . Years of education: Not on file  . Highest education level: Not on file  Occupational History  . Occupation: [employer]  Tobacco Use  . Smoking status: Never  Smoker  . Smokeless tobacco: Never Used  Vaping Use  . Vaping Use: Never used  Substance and Sexual Activity  . Alcohol use: Yes    Alcohol/week: 3.0 - 4.0 standard drinks    Types: 3 - 4 Standard drinks or equivalent per week    Comment: weekends  . Drug use: No  . Sexual activity: Yes  Other Topics Concern  . Not on file  Social History Narrative  . Not on file   Social Determinants of Health   Financial Resource Strain:   . Difficulty of Paying Living Expenses:   Food Insecurity:   . Worried About Programme researcher, broadcasting/film/video in the Last Year:   . Barista in the Last Year:   Transportation Needs:   . Freight forwarder (Medical):   Marland Kitchen Lack of Transportation (Non-Medical):   Physical Activity:   . Days of Exercise per Week:   . Minutes of Exercise per Session:   Stress:   . Feeling of Stress :   Social Connections:   . Frequency of Communication with Friends and Family:   . Frequency of Social Gatherings with Friends and Family:   . Attends Religious Services:   . Active Member of Clubs or Organizations:   . Attends Banker Meetings:   Marland Kitchen Marital Status:      Family History: The patient's family history includes CVA (age of onset: 44) in his father; Gout in his father, maternal grandfather, maternal grandmother, paternal grandfather, and paternal grandmother; Healthy in his sister and sister; Heart attack (age of onset: 51) in his father; Hyperlipidemia in his mother.  ROS:   Please see the history of present illness.    All other systems reviewed and are negative.  EKGs/Labs/Other Studies Reviewed:    The following studies were reviewed today: 2D echocardiogram 09/19/2019: IMPRESSIONS    1. Normal LVEF, normal and stable transmitral gradients, mean 3 mmHg.  2. Left ventricular ejection fraction, by estimation, is 55 to 60%. The  left ventricle has normal function. The left ventricle has no regional  wall motion abnormalities. There is mild  concentric left ventricular  hypertrophy. Left ventricular diastolic  function could not be evaluated.  3. Right ventricular systolic function is normal. The right ventricular  size is normal.  4. The mitral valve has been repaired/replaced. Trivial mitral valve  regurgitation. No evidence of mitral stenosis. The mean mitral valve  gradient is 3.0 mmHg with average heart rate of 67 bpm. There is a 34  prosthetic annuloplasty ring present in the  mitral position. Procedure Date: 2015.  5. The aortic valve is normal in structure. Aortic valve regurgitation is  not visualized. No aortic stenosis is present.  6. The inferior vena cava is normal in size with greater than 50%  respiratory variability, suggesting right atrial pressure of 3  mmHg.   EKG:  EKG is ordered today.  The ekg ordered today demonstrates normal sinus rhythm 68 bpm, within normal limits  Recent Labs: 06/05/2019: ALT 37; BUN 13; Creatinine 0.9; Hemoglobin 15.4; Platelets 279; Potassium 4.3; Sodium 141; TSH 1.37  Recent Lipid Panel    Component Value Date/Time   CHOL 148 06/04/2019 0000   CHOL 168 10/09/2018 1326   TRIG 65 06/04/2019 0000   HDL 70 06/04/2019 0000   HDL 58 10/09/2018 1326   CHOLHDL 2.9 10/09/2018 1326   CHOLHDL 3.7 12/04/2015 0806   VLDL 24 12/04/2015 0806   LDLCALC 65 06/04/2019 0000   LDLCALC 91 10/09/2018 1326    Physical Exam:    VS:  BP 104/66   Pulse 68   Wt 156 lb 6.4 oz (70.9 kg)   SpO2 97%   BMI 22.77 kg/m     Wt Readings from Last 3 Encounters:  10/24/19 156 lb 6.4 oz (70.9 kg)  06/07/19 158 lb 4 oz (71.8 kg)  10/16/18 165 lb 6.4 oz (75 kg)     GEN:  Well nourished, well developed in no acute distress HEENT: Normal NECK: No JVD; No carotid bruits LYMPHATICS: No lymphadenopathy CARDIAC: RRR, no murmurs, rubs, gallops RESPIRATORY:  Clear to auscultation without rales, wheezing or rhonchi  ABDOMEN: Soft, non-tender, non-distended MUSCULOSKELETAL:  No edema; No deformity    SKIN: Warm and dry NEUROLOGIC:  Alert and oriented x 3 PSYCHIATRIC:  Normal affect   ASSESSMENT:    1. Mixed hyperlipidemia   2. Coronary artery disease involving native coronary artery of native heart without angina pectoris   3. S/P minimally invasive mitral valve repair    PLAN:    In order of problems listed above:  1. The patient is doing very well.  He takes low-dose Crestor 3 days/week and has no adverse effects related to this.  His lipids and LFTs are reviewed with LDL cholesterol at goal of less than 70 mg/dL.  LFTs are within normal limits.  He has done a great job with controlling his weight, decreasing from about 200 pounds in February 2020 now to 156 pounds.  He is following a healthy lifestyle. 2. Nonobstructive coronary atherosclerosis noted at the time of preoperative heart catheterization before mitral valve surgery.  Risk factor modification as above. 3. The patient is doing very well.  His recent echocardiogram is reviewed which demonstrates normal LV function and intact mitral repair site with no residual MR and no mitral stenosis.  Overall Lawrence Underwood is doing very well.  I asked him to go ahead and wean off of low-dose metoprolol as I do not think he requires this anymore and his blood pressure is now in the low normal range.  He will take 25 mg 3 days/week for 2 weeks and then will stop.  He will otherwise continue on his current medical program and I will see him back in 1 year.   Medication Adjustments/Labs and Tests Ordered: Current medicines are reviewed at length with the patient today.  Concerns regarding medicines are outlined above.  Orders Placed This Encounter  Procedures  . EKG 12-Lead   No orders of the defined types were placed in this encounter.   Patient Instructions  Medication Instructions:  1) DECREASE METOPROLOL to three times weekly for 2 weeks then STOP. (This has been removed from your med list already) *If you need a refill on your cardiac  medications before your next appointment, please call your pharmacy*  Follow-Up: At [facility], you and your health needs are our priority.  As part of our continuing mission to provide you with exceptional heart care, we have created designated Provider Care Teams.  These Care Teams include your primary Cardiologist (physician) and Advanced Practice Providers (APPs -  Physician Assistants and Nurse Practitioners) who all work together to provide you with the care you need, when you need it. Your next appointment:   12 month(s) The format for your next appointment:   In Person Provider:   You may see Tonny Bollman, MD or one of the following Advanced Practice Providers on your designated Care Team:    Tereso Newcomer, PA-C  [place], [place]      Signed, Tonny Bollman, MD  10/24/2019 2:21 PM    [facility]

## 2019-12-27 ENCOUNTER — Other Ambulatory Visit: Payer: Self-pay | Admitting: Cardiovascular Disease

## 2019-12-27 ENCOUNTER — Other Ambulatory Visit: Payer: Self-pay | Admitting: *Deleted

## 2020-03-18 DIAGNOSIS — Z1159 Encounter for screening for other viral diseases: Secondary | ICD-10-CM | POA: Diagnosis not present

## 2020-03-26 DIAGNOSIS — Z1159 Encounter for screening for other viral diseases: Secondary | ICD-10-CM | POA: Diagnosis not present

## 2020-06-07 ENCOUNTER — Other Ambulatory Visit: Payer: Self-pay | Admitting: Family Medicine

## 2020-06-11 LAB — CBC AND DIFFERENTIAL
HCT: 47 (ref 41–53)
Hemoglobin: 15.3 (ref 13.5–17.5)
Neutrophils Absolute: 3.5
Platelets: 273 (ref 150–399)
WBC: 5.6

## 2020-06-11 LAB — BASIC METABOLIC PANEL
BUN: 16 (ref 4–21)
Chloride: 104 (ref 99–108)
Creatinine: 1.1 (ref 0.6–1.3)
Glucose: 102
Potassium: 4.5 (ref 3.4–5.3)
Sodium: 141 (ref 137–147)

## 2020-06-11 LAB — LIPID PANEL
Cholesterol: 179 (ref 0–200)
HDL: 72 — AB (ref 35–70)
LDL Cholesterol: 95
LDl/HDL Ratio: 1.3
Triglycerides: 60 (ref 40–160)

## 2020-06-11 LAB — COMPREHENSIVE METABOLIC PANEL
Albumin: 4.4 (ref 3.5–5.0)
Calcium: 9 (ref 8.7–10.7)
Globulin: 2.2

## 2020-06-11 LAB — HEPATIC FUNCTION PANEL
ALT: 26 (ref 10–40)
AST: 30 (ref 14–40)
Alkaline Phosphatase: 74 (ref 25–125)
Bilirubin, Total: 0.5

## 2020-06-11 LAB — TSH: TSH: 1.46 (ref 0.41–5.90)

## 2020-06-11 LAB — CBC: RBC: 5.42 — AB (ref 3.87–5.11)

## 2020-06-12 ENCOUNTER — Ambulatory Visit (INDEPENDENT_AMBULATORY_CARE_PROVIDER_SITE_OTHER): Payer: 59 | Admitting: Family Medicine

## 2020-06-12 ENCOUNTER — Other Ambulatory Visit: Payer: Self-pay

## 2020-06-12 ENCOUNTER — Encounter: Payer: Self-pay | Admitting: Family Medicine

## 2020-06-12 VITALS — BP 100/62 | HR 69 | Temp 98.3°F | Ht 69.5 in | Wt 157.8 lb

## 2020-06-12 DIAGNOSIS — Z0000 Encounter for general adult medical examination without abnormal findings: Secondary | ICD-10-CM | POA: Diagnosis not present

## 2020-06-12 DIAGNOSIS — M109 Gout, unspecified: Secondary | ICD-10-CM | POA: Diagnosis not present

## 2020-06-12 DIAGNOSIS — Z23 Encounter for immunization: Secondary | ICD-10-CM | POA: Diagnosis not present

## 2020-06-12 DIAGNOSIS — Z125 Encounter for screening for malignant neoplasm of prostate: Secondary | ICD-10-CM

## 2020-06-12 DIAGNOSIS — Z1159 Encounter for screening for other viral diseases: Secondary | ICD-10-CM

## 2020-06-12 DIAGNOSIS — E782 Mixed hyperlipidemia: Secondary | ICD-10-CM

## 2020-06-12 MED ORDER — ROSUVASTATIN CALCIUM 5 MG PO TABS: 5.0000 mg | ORAL_TABLET | Freq: Every day | ORAL | 3 refills | Status: DC

## 2020-06-12 NOTE — Progress Notes (Signed)
Patient ID: Lawrence Underwood, male    DOB: 1965/10/30, 55 y.o.   MRN: [id number]  This visit was conducted in person.  BP 100/62   Pulse 69   Temp 98.3 F (36.8 C) (Temporal)   Ht 5' 9.5" (1.765 m)   Wt 157 lb 12 oz (71.6 kg)   SpO2 96%   BMI 22.96 kg/m    CC:  Chief Complaint  Patient presents with  . Annual Exam    Subjective:   HPI: Lawrence Underwood is a 55 y.o. male presenting on 06/12/2020 for Annual Exam   See scanned in labs... Change to crestor 3 daya week.. now LDL > 70.  Lab Results  Component Value Date   CHOL 148 06/04/2019   HDL 70 06/04/2019   LDLCALC 65 06/04/2019   TRIG 65 06/04/2019   CHOLHDL 2.9 10/09/2018  Using medications without problems: none Muscle aches: none Diet compliance: good Exercise: daily Other complaints: CAD on baby aspirin.  BP Readings from Last 3 Encounters:  06/12/20 100/62  10/24/19 104/66  06/07/19 90/60    Gout on allopurinol 100 mg daily.Marland Kitchen no flares  S/P mitral valve repair     Wt Readings from Last 3 Encounters:  06/12/20 157 lb 12 oz (71.6 kg)  10/24/19 156 lb 6.4 oz (70.9 kg)  06/07/19 158 lb 4 oz (71.8 kg)     Relevant past medical, surgical, family and social history reviewed and updated as indicated. Interim medical history since our last visit reviewed. Allergies and medications reviewed and updated. Outpatient Medications Prior to Visit  Medication Sig Dispense Refill  . allopurinol (ZYLOPRIM) 100 MG tablet TAKE 1 TABLET BY MOUTH EVERY DAY 90 tablet 0  . aspirin EC 81 MG tablet Take 1 tablet (81 mg total) by mouth daily.    . rosuvastatin (CRESTOR) 5 MG tablet TAKE 1 TABLET (5 MG TOTAL) BY MOUTH 3 (THREE) TIMES A WEEK. PLEASE KEEP UPCOMING APPT IN AUGUST WITH DR. Excell Seltzer BEFORE ANYMORE REFILLS. THANK YOU 36 tablet 1   Facility-Administered Medications Prior to Visit  Medication Dose Route Frequency Provider Last Rate Last Admin  . 0.9 %  sodium chloride infusion  500 mL Intravenous Once Tressia Danas, MD         Per HPI unless specifically indicated in ROS section below Review of Systems  Constitutional: Negative for fatigue, fever and unexpected weight change.  HENT: Negative for congestion, ear pain, postnasal drip, rhinorrhea, sore throat and trouble swallowing.   Eyes: Negative for pain.  Respiratory: Negative for cough, shortness of breath and wheezing.   Cardiovascular: Negative for chest pain, palpitations and leg swelling.  Gastrointestinal: Negative for abdominal pain, blood in stool, constipation, diarrhea and nausea.  Genitourinary: Negative for difficulty urinating, dysuria, hematuria, penile discharge, penile pain, penile swelling, scrotal swelling, testicular pain and urgency.  Skin: Negative for rash.  Neurological: Negative for syncope, weakness, light-headedness, numbness and headaches.  Psychiatric/Behavioral: Negative for behavioral problems and dysphoric mood. The patient is not nervous/anxious.    Objective:  BP 100/62   Pulse 69   Temp 98.3 F (36.8 C) (Temporal)   Ht 5' 9.5" (1.765 m)   Wt 157 lb 12 oz (71.6 kg)   SpO2 96%   BMI 22.96 kg/m   Wt Readings from Last 3 Encounters:  06/12/20 157 lb 12 oz (71.6 kg)  10/24/19 156 lb 6.4 oz (70.9 kg)  06/07/19 158 lb 4 oz (71.8 kg)      Physical Exam Constitutional:  Appearance: He is well-developed.  HENT:     Head: Normocephalic.     Right Ear: Hearing normal.     Left Ear: Hearing normal.     Nose: Nose normal.  Neck:     Thyroid: No thyroid mass or thyromegaly.     Vascular: No carotid bruit.     Trachea: Trachea normal.  Cardiovascular:     Rate and Rhythm: Normal rate and regular rhythm.     Pulses: Normal pulses.     Heart sounds: Heart sounds not distant. No murmur heard. No friction rub. No gallop.      Comments: No peripheral edema Pulmonary:     Effort: Pulmonary effort is normal. No respiratory distress.     Breath sounds: Normal breath sounds.  Skin:    General: Skin  is warm and dry.     Findings: No rash.  Psychiatric:        Speech: Speech normal.        Behavior: Behavior normal.        Thought Content: Thought content normal.       Results for orders placed or performed in visit on 06/07/19  CBC and differential  Result Value Ref Range   Hemoglobin 15.4 13.5 - 17.5   HCT 45 41 - 53   Neutrophils Absolute 2    Platelets 279 150 - 399   WBC 4.3   CBC  Result Value Ref Range   RBC 5.17 (A) 3.87 - 5.11  Basic metabolic panel  Result Value Ref Range   Glucose 97    BUN 13 4 - 21   Creatinine 0.9 0.6 - 1.3   Potassium 4.3 3.4 - 5.3   Sodium 141 137 - 147   Chloride 104 99 - 108  Comprehensive metabolic panel  Result Value Ref Range   Globulin 2.1    Calcium 9.3 8.7 - 10.7   Albumin 4.8 3.5 - 5.0  Lipid panel  Result Value Ref Range   LDl/HDL Ratio 0.9    Triglycerides 65 40 - 160   Cholesterol 148 0 - 200   HDL 70 35 - 70   LDL Cholesterol 65   Hepatic function panel  Result Value Ref Range   Alkaline Phosphatase 72 25 - 125   ALT 37 10 - 40   AST 35 14 - 40   Bilirubin, Total 0.7   TSH  Result Value Ref Range   TSH 1.37 0.41 - 5.90    This visit occurred during the SARS-CoV-2 public health emergency.  Safety protocols were in place, including screening questions prior to the visit, additional usage of staff PPE, and extensive cleaning of exam room while observing appropriate contact time as indicated for disinfecting solutions.   COVID 19 screen:  No recent travel or known exposure to COVID19 The patient denies respiratory symptoms of COVID 19 at this time. The importance of social distancing was discussed today.   Assessment and Plan   The patient's preventative maintenance and recommended screening tests for an annual wellness exam were reviewed in full today. Brought up to date unless services declined.  Counselled on the importance of diet, exercise, and its role in overall health and mortality. The patient's FH  and SH was reviewed, including their home life, tobacco status, and drug and alcohol status.   Vaccines:uptodate td, COVID vaccine x 3 , consider shingrix Prostate Cancer Screen: PSA due. Colon Cancer Screen:09/2018 repeat in 10 years.  Smoking Status:none ETOH/ drug PJK:DTOIZT weekends/none HIV screen:refused. Problem List Items Addressed This Visit    Gout    No flares.. continue allopurinol 100 mg daily.      HYPERLIPIDEMIA     LDL now above goal > 70 on three times weekly crestor 5 mg daily... he will restart crestor 5 mg daily and re-eval in  3 months.  He will also discuss with Cardiology Dr. Excell Seltzer.      Relevant Orders   Lipid panel   Comprehensive metabolic panel    Other Visit Diagnoses    Prostate cancer screening    -  Primary   Relevant Orders   PSA   Need for hepatitis C screening test       Relevant Orders   Hepatitis C antibody        [name], MD

## 2020-06-12 NOTE — Assessment & Plan Note (Signed)
No flares.. continue allopurinol 100 mg daily.

## 2020-06-12 NOTE — Patient Instructions (Addendum)
Change back to Crestor 5 mg daily.  Nurse visit  for Shingrix  # 2 in 2-6 months.

## 2020-06-12 NOTE — Assessment & Plan Note (Signed)
LDL now above goal > 70 on three times weekly crestor 5 mg daily... he will restart crestor 5 mg daily and re-eval in  3 months.  He will also discuss with Cardiology Dr. Excell Seltzer.

## 2020-06-18 ENCOUNTER — Other Ambulatory Visit: Payer: Self-pay | Admitting: Cardiology

## 2020-09-07 ENCOUNTER — Other Ambulatory Visit: Payer: Self-pay | Admitting: Family Medicine

## 2020-09-17 ENCOUNTER — Ambulatory Visit: Payer: 59

## 2020-09-17 ENCOUNTER — Other Ambulatory Visit: Payer: 59

## 2020-10-07 ENCOUNTER — Telehealth: Payer: Self-pay | Admitting: Family Medicine

## 2020-10-07 DIAGNOSIS — E782 Mixed hyperlipidemia: Secondary | ICD-10-CM

## 2020-10-07 NOTE — Telephone Encounter (Signed)
Yes do all: CMET, lipids, hep c and PSa

## 2020-10-07 NOTE — Telephone Encounter (Signed)
-----   Message from Alvina Chou sent at 09/25/2020 11:50 AM EDT ----- Regarding: lab orders for Thursday, 7.21.22 Lab orders for fasting labs? Also has a couple of labs ordered, Hep C and PSA, do you want those done ?

## 2020-10-08 ENCOUNTER — Other Ambulatory Visit: Payer: Self-pay

## 2020-10-08 ENCOUNTER — Ambulatory Visit (INDEPENDENT_AMBULATORY_CARE_PROVIDER_SITE_OTHER): Payer: 59

## 2020-10-08 ENCOUNTER — Other Ambulatory Visit (INDEPENDENT_AMBULATORY_CARE_PROVIDER_SITE_OTHER): Payer: 59

## 2020-10-08 DIAGNOSIS — Z23 Encounter for immunization: Secondary | ICD-10-CM | POA: Diagnosis not present

## 2020-10-08 DIAGNOSIS — Z125 Encounter for screening for malignant neoplasm of prostate: Secondary | ICD-10-CM | POA: Diagnosis not present

## 2020-10-08 DIAGNOSIS — E782 Mixed hyperlipidemia: Secondary | ICD-10-CM

## 2020-10-08 DIAGNOSIS — Z1159 Encounter for screening for other viral diseases: Secondary | ICD-10-CM

## 2020-10-08 LAB — LIPID PANEL
Cholesterol: 177 mg/dL (ref 0–200)
HDL: 74 mg/dL (ref >39.00)
LDL Cholesterol: 90 mg/dL (ref 0–99)
NonHDL: 102.7
Total CHOL/HDL Ratio: 2
Triglycerides: 62 mg/dL (ref 0.0–149.0)
VLDL: 12.4 mg/dL (ref 0.0–40.0)

## 2020-10-08 LAB — COMPREHENSIVE METABOLIC PANEL
ALT: 30 U/L (ref 0–53)
AST: 29 U/L (ref 0–37)
Albumin: 5 g/dL (ref 3.5–5.2)
Alkaline Phosphatase: 62 U/L (ref 39–117)
BUN: 27 mg/dL — ABNORMAL HIGH (ref 6–23)
CO2: 27 mEq/L (ref 19–32)
Calcium: 9.7 mg/dL (ref 8.4–10.5)
Chloride: 106 mEq/L (ref 96–112)
Creatinine, Ser: 1.24 mg/dL (ref 0.40–1.50)
GFR: 66.72 mL/min (ref >60.00)
Glucose, Bld: 93 mg/dL (ref 70–99)
Potassium: 4.6 mEq/L (ref 3.5–5.1)
Sodium: 144 mEq/L (ref 135–145)
Total Bilirubin: 1.1 mg/dL (ref 0.2–1.2)
Total Protein: 7.3 g/dL (ref 6.0–8.3)

## 2020-10-08 LAB — PSA: PSA: 1.29 ng/mL (ref 0.10–4.00)

## 2020-10-08 NOTE — Progress Notes (Signed)
Per orders of Dr. Ermalene Searing, Amy, an injection of Shingrix was given in his left deltoid by Nyra Capes. Patient tolerated injection well.

## 2020-10-09 LAB — HEPATITIS C ANTIBODY
Hepatitis C Ab: NONREACTIVE
SIGNAL TO CUT-OFF: 0.01 (ref <1.00)

## 2020-10-15 ENCOUNTER — Telehealth: Payer: Self-pay | Admitting: Physician Assistant

## 2020-10-15 NOTE — Telephone Encounter (Signed)
Does pt need to have lab work done prior to appt in December with Lawrence Underwood? Please advise.

## 2020-10-16 NOTE — Telephone Encounter (Signed)
He just had labs earlier this month with primary care.  At this time, there are no routine labs we need to schedule for his visit in December.   Tereso Newcomer, PA-C    10/16/2020 8:50 AM

## 2021-03-03 ENCOUNTER — Other Ambulatory Visit: Payer: Self-pay

## 2021-03-03 ENCOUNTER — Ambulatory Visit (INDEPENDENT_AMBULATORY_CARE_PROVIDER_SITE_OTHER): Payer: 59 | Admitting: Physician Assistant

## 2021-03-03 ENCOUNTER — Encounter: Payer: Self-pay | Admitting: Physician Assistant

## 2021-03-03 VITALS — BP 102/70 | HR 74 | Ht 70.0 in | Wt 163.2 lb

## 2021-03-03 DIAGNOSIS — E782 Mixed hyperlipidemia: Secondary | ICD-10-CM

## 2021-03-03 DIAGNOSIS — I2510 Atherosclerotic heart disease of native coronary artery without angina pectoris: Secondary | ICD-10-CM | POA: Diagnosis not present

## 2021-03-03 DIAGNOSIS — Z9889 Other specified postprocedural states: Secondary | ICD-10-CM | POA: Diagnosis not present

## 2021-03-03 MED ORDER — ROSUVASTATIN CALCIUM 5 MG PO TABS: 5.0000 mg | ORAL_TABLET | Freq: Every day | ORAL | 3 refills | Status: DC

## 2021-03-03 NOTE — Assessment & Plan Note (Signed)
LDL 90.  Given hx and min disease on cath, I think this is reasonable control.  If LDL increases over time, consider increasing dose of Rosuvastatin.

## 2021-03-03 NOTE — Progress Notes (Signed)
Cardiology Office Note:    Date:  03/03/2021   ID:  Lawrence Underwood, DOB October 03, 1965, MRN [id number]  PCP:  Lawrence Seltzer, MD   [facility] Providers Cardiologist:  Lawrence Bollman, MD Cardiology APP:  Lawrence Underwood     Referring MD: Lawrence Seltzer, MD   Chief Complaint:  F/u for hx of MV repair    Patient Profile:   Lawrence Underwood is a 55 y.o. 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 Excell Underwood in August 2021.  He returns for follow-up.  He is here alone.  He is doing well without chest pain, shortness of breath, syncope.  He runs/walks 5-6 miles a day.        ASSESSMENT & PLAN:   S/P minimally invasive mitral valve repair Normally functioning MV repair on echocardiogram in 2021.  Continue SBE prophylaxis.  He prefers to have his annual visit in Aug.  F/u in 10/2021.    Coronary artery disease Min plaque on cath prior to MV surgery.  He runs/walks every day without chest pain.  Continue ASA, statin Rx.   HYPERLIPIDEMIA LDL 90.  Given hx and min disease on cath, I think this is reasonable control.  If LDL increases over time, consider increasing dose of Rosuvastatin.           Dispo:  Return in about 8 months (around 11/01/2021) for Routine follow up in 8 months with Dr. Excell Underwood or Lawrence Newcomer, PA-C.Marland Kitchen    Prior CV studies: Echocardiogram 09/19/2019 EF 55-60, no RWMA, normal RVSF, s/p MV repair with trivial MR and mean gradient 3  Echo 3/16 Mild LVH, EF 50-55%, normal wall motion, grade 1 diastolic dysfunction, MV repair with mild MS (mean 6 mmHg) and no MR   Carotid US 7/15 Bilateral ICA 1-39%   LHC 7/15 LM, LAD, LCx patent RCA mid 40% Vigorous LVEF, EF 65%, severe mitral regurgitation    Past Medical History:  Diagnosis Date   Coronary artery disease 09/18/2013   40% stenosis RCA and otherwise  non-obstructive CAD    Dizziness    r/Underwood mitral valve   GERD (gastroesophageal reflux disease)    no meds   Gout    but doesn'Underwood take any meds for this   H/O hiatal hernia    History of bronchitis 2 yrs ago   History of echocardiogram    Echocardiogram 7/21: EF 55-60, no RWMA, mild conc LVH, normal RVSF, MV repair w/ trace MR, no MS (mean 3 mmHg)   Hyperlipidemia    Hypertension    Mitral regurgitation due to cusp prolapse 08/26/2013   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   Complex valvuloplasty including triangular resection of flail posterior leaflet, artificial Gore-tex neocord placement x4, Lawrence Underwood 3D ring annuloplasty (size 34 mm) via right mini thoracotomy approach   Current Medications: Current Meds  Medication Sig   allopurinol (ZYLOPRIM) 100 MG tablet TAKE 1 TABLET BY MOUTH EVERY DAY   aspirin EC 81 MG tablet Take 1 tablet (81 mg total) by mouth daily.   [DISCONTINUED] rosuvastatin (CRESTOR) 5 MG tablet Take 1 tablet (5 mg total) by mouth daily.   Current Facility-Administered Medications for the 03/03/21 encounter (Office Visit) with Lawrence Underwood T, PA-C  Medication   0.9 %  sodium chloride infusion    Allergies:   Lisinopril  Social History   Tobacco Use   Smoking status: Never   Smokeless tobacco: Never  Vaping Use   Vaping Use: Never used  Substance Use Topics   Alcohol use: Yes    Alcohol/week: 3.0 - 4.0 standard drinks    Types: 3 - 4 Standard drinks or equivalent per week    Comment: weekends   Drug use: No    Family Hx: The patient's family history includes CVA (age of onset: 64) in his father; Gout in his father, maternal grandfather, maternal grandmother, paternal grandfather, and paternal grandmother; Healthy in his sister and sister; Heart attack (age of onset: 72) in his father; Hyperlipidemia in his mother.  ROS see HPI  EKGs/Labs/Other Test Reviewed:    EKG:  EKG is   ordered today.  The ekg ordered today demonstrates normal  sinus rhythm, HR 74, leftward axis, no ST-TW ?'s, QTc 450 ms   Recent Labs: 06/11/2020: Hemoglobin 15.3; Platelets 273; TSH 1.46 10/08/2020: ALT 30; BUN 27; Creatinine, Ser 1.24; Potassium 4.6; Sodium 144   Recent Lipid Panel Recent Labs    10/08/20 1348  CHOL 177  TRIG 62.0  HDL 74.00  LDLCALC 90    Labs from primary care personally reviewed and interpreted 06/10/2020: Creatinine 1.08, K+ 4.5, albumin 4.4, ALT 13, total cholesterol 179, triglycerides 60, HDL 72, LDL 95, TSH 1.460, Hgb 15.3  Risk Assessment/Calculations:          Physical Exam:    VS:  BP 102/70    Pulse 74    Ht 5\' 10"  (1.778 m)    Wt 163 lb 3.2 oz (74 kg)    SpO2 98%    BMI 23.42 kg/m     Wt Readings from Last 3 Encounters:  03/03/21 163 lb 3.2 oz (74 kg)  06/12/20 157 lb 12 oz (71.6 kg)  10/24/19 156 lb 6.4 oz (70.9 kg)    Constitutional:      Appearance: Healthy appearance. Not in distress.  Neck:     Vascular: No carotid bruit. JVD normal.  Pulmonary:     Effort: Pulmonary effort is normal.     Breath sounds: No wheezing. No rales.  Cardiovascular:     Normal rate. Regular rhythm. Normal S1. Normal S2.      Murmurs: There is no murmur.  Edema:    Peripheral edema absent.  Abdominal:     Palpations: Abdomen is soft.  Skin:    General: Skin is warm and dry.  Neurological:     General: No focal deficit present.     Mental Status: Alert and oriented to person, place and time.     Cranial Nerves: Cranial nerves are intact.        Medication Adjustments/Labs and Tests Ordered: Current medicines are reviewed at length with the patient today.  Concerns regarding medicines are outlined above.  Tests Ordered: Orders Placed This Encounter  Procedures   EKG 12-Lead   Medication Changes: Meds ordered this encounter  Medications   rosuvastatin (CRESTOR) 5 MG tablet    Sig: Take 1 tablet (5 mg total) by mouth daily.    Dispense:  90 tablet    Refill:  3   Signed, 12/24/19, PA-C   03/03/2021 3:47 PM    [facility] [address] [place], [place], [place]  [place] Phone: [phone]; Fax: [phone]

## 2021-03-03 NOTE — Patient Instructions (Signed)
Medication Instructions:   Your physician recommends that you continue on your current medications as directed. Please refer to the Current Medication list given to you today.  *If you need a refill on your cardiac medications before your next appointment, please call your pharmacy*   Lab Work:  -NONE  If you have labs (blood work) drawn today and your tests are completely normal, you will receive your results only by: MyChart Message (if you have MyChart) OR A paper copy in the mail If you have any lab test that is abnormal or we need to change your treatment, we will call you to review the results.   Testing/Procedures:  -NONE   Follow-Up: At [facility], you and your health needs are our priority.  As part of our continuing mission to provide you with exceptional heart care, we have created designated Provider Care Teams.  These Care Teams include your primary Cardiologist (physician) and Advanced Practice Providers (APPs -  Physician Assistants and Nurse Practitioners) who all work together to provide you with the care you need, when you need it.  We recommend signing up for the patient portal called "MyChart".  Sign up information is provided on this After Visit Summary.  MyChart is used to connect with patients for Virtual Visits (Telemedicine).  Patients are able to view lab/test results, encounter notes, upcoming appointments, etc.  Non-urgent messages can be sent to your provider as well.   To learn more about what you can do with MyChart, go to [email or web address].    Your next appointment:   8 month(s)  The format for your next appointment:   In Person  Provider:   Tonny Bollman, MD OR Tereso Newcomer, PA-C    Other Instructions  Your physician wants you to follow-up in: 8 months with Dr. Excell Seltzer or Tereso Newcomer, PA-C.  You will receive a reminder letter in the mail two months in advance. If you don't receive a letter, please call our office to schedule the  follow-up appointment.

## 2021-03-03 NOTE — Assessment & Plan Note (Signed)
Normally functioning MV repair on echocardiogram in 2021.  Continue SBE prophylaxis.  He prefers to have his annual visit in Aug.  F/u in 10/2021.

## 2021-03-03 NOTE — Assessment & Plan Note (Signed)
Min plaque on cath prior to MV surgery.  He runs/walks every day without chest pain.  Continue ASA, statin Rx.

## 2021-03-11 ENCOUNTER — Encounter: Payer: Self-pay | Admitting: Family Medicine

## 2021-03-11 ENCOUNTER — Telehealth (INDEPENDENT_AMBULATORY_CARE_PROVIDER_SITE_OTHER): Payer: 59 | Admitting: Family Medicine

## 2021-03-11 ENCOUNTER — Other Ambulatory Visit: Payer: Self-pay

## 2021-03-11 VITALS — Temp 97.0°F | Ht 70.0 in | Wt 160.0 lb

## 2021-03-11 DIAGNOSIS — U071 COVID-19: Secondary | ICD-10-CM | POA: Diagnosis not present

## 2021-03-11 MED ORDER — NIRMATRELVIR/RITONAVIR (PAXLOVID)TABLET: 3.0000 | ORAL_TABLET | Freq: Two times a day (BID) | ORAL | 0 refills | Status: AC

## 2021-03-11 NOTE — Progress Notes (Signed)
VIRTUAL VISIT Due to national recommendations of social distancing due to COVID 19, a virtual visit is felt to be most appropriate for this patient at this time.   I connected with the patient on 03/11/21 at 10:00 AM EST by virtual telehealth platform and verified that I am speaking with the correct person using two identifiers.   I discussed the limitations, risks, security and privacy concerns of performing an evaluation and management service by  virtual telehealth platform and the availability of in person appointments. I also discussed with the patient that there may be a patient responsible charge related to this service. The patient expressed understanding and agreed to proceed.  Patient location: Home Provider Location: [facility] Participants: [name] and [name]   Chief Complaint  Patient presents with   Covid Positive    Positive home test yesterday-Symptoms started Sunday night   Cough   Nasal Congestion   Lost of taste and smell    History of Present Illness:  55 year old male patient with history of heart valve replacement presents with new onset COVID infection.  Date of onset: 03/07/2021   Started with chills, ST, congestion, body aches  No fever.  Progressed to  productive cough, dry, lost sense of taste, no SOB.  No ear pain, no face pain  He has been using cough drop, OTC cold and flu med, nyquil  Seeping at night well.  COVID 19 screen COVID testing:12/21 positive COVID vaccine:   02/22/2020 , 07/08/2019 , 06/14/2019  Pfizer Covid-19 Vaccine Bivalent Booster 01/20/2021   COVID exposure: No recent travel or known exposure to COVID19  The importance of social distancing was discussed today.    Review of Systems  Constitutional:  Negative for chills and fever.  HENT:  Negative for congestion and ear pain.   Eyes:  Negative for pain and redness.  Respiratory:  Negative for cough and shortness of breath.   Cardiovascular:  Negative for  chest pain, palpitations and leg swelling.  Gastrointestinal:  Negative for abdominal pain, blood in stool, constipation, diarrhea, nausea and vomiting.  Genitourinary:  Negative for dysuria.  Musculoskeletal:  Negative for falls and myalgias.  Skin:  Negative for rash.  Neurological:  Negative for dizziness.  Psychiatric/Behavioral:  Negative for depression. The patient is not nervous/anxious.      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   Complex valvuloplasty including triangular resection of flail posterior leaflet, artificial Gore-tex neocord placement x4, Sorin Memo 3D ring annuloplasty (size 34 mm) via right mini thoracotomy approach    reports that he has never smoked. He has never used smokeless tobacco. He reports current alcohol use of about 3.0 - 4.0 standard drinks per week. He reports that he does not use drugs.   Current Outpatient Medications:    allopurinol (  ZYLOPRIM) 100 MG tablet, TAKE 1 TABLET BY MOUTH EVERY DAY, Disp: 90 tablet, Rfl: 1   aspirin EC 81 MG tablet, Take 1 tablet (81 mg total) by mouth daily., Disp: , Rfl:    rosuvastatin (CRESTOR) 5 MG tablet, Take 1 tablet (5 mg total) by mouth daily., Disp: 90 tablet, Rfl: 3  Current Facility-Administered Medications:    0.9 %  sodium chloride infusion, 500 mL, Intravenous, Once, Tressia Danas, MD   Observations/Objective: Temperature (!) 97 F (36.1 C),  temperature source Temporal, height 5\' 10"  (1.778 m), weight 160 lb (72.6 kg).  Physical Exam  Physical Exam Constitutional:      General: The patient is not in acute distress. Pulmonary:     Effort: Pulmonary effort is normal. No respiratory distress.  Neurological:     Mental Status: The patient is alert and oriented to person, place, and time.  Psychiatric:        Mood and Affect: Mood normal.        Behavior: Behavior normal.   Assessment and Plan Problem List Items Addressed This Visit     COVID-19 virus infection - Primary    COVID19  Infection < 5 days from onset of symptoms in 4 X vaccinated individual with history of  Heart valve replacement  No clear sign of bacterial infection at this time.   No SOB.  No red flags/need for ER visit or in-person exam at respiratory clinic at this time..    Pt higher risk for COVID complications . GFR  66 and no medication contraindications except needs to hold the crestor while he takes the antiviral.  Start paxlovid 5 day course. Reviewed course of medication and side effect profile with patient in detail.   Symptomatic care with mucinex and cough suppressant at night. If SOB begins symptoms worsening.. have low threshold for in-person exam, if severe shortness of breath ER visit recommended.  Can monitor Oxygen saturation at home with home monitor if able to obtain.  Go to ER if O2 sat < 90% on room air.   Reviewed home care and provided information through MyChart.  Recommended quarantine 5 days isolation recommended. Return to work day 6 and wear mask for 4 more days to complete 10 days. Provided info about prevention of spread of COVID 19.       Relevant Medications   nirmatrelvir/ritonavir EUA (PAXLOVID) 20 x 150 MG & 10 x 100MG  TABS      I discussed the assessment and treatment plan with the patient. The patient was provided an opportunity to ask questions and all were answered. The patient agreed with the plan and  demonstrated an understanding of the instructions.   The patient was advised to call back or seek an in-person evaluation if the symptoms worsen or if the condition fails to improve as anticipated.     Marland Kitchen, MD

## 2021-03-11 NOTE — Assessment & Plan Note (Signed)
COVID19  Infection < 5 days from onset of symptoms in 4 X vaccinated individual with history of  Heart valve replacement  No clear sign of bacterial infection at this time.   No SOB.  No red flags/need for ER visit or in-person exam at respiratory clinic at this time..    Pt higher risk for COVID complications .Marland Kitchen GFR  66 and no medication contraindications except needs to hold the crestor while he takes the antiviral.  Start paxlovid 5 day course. Reviewed course of medication and side effect profile with patient in detail.   Symptomatic care with mucinex and cough suppressant at night. If SOB begins symptoms worsening.. have low threshold for in-person exam, if severe shortness of breath ER visit recommended.  Can monitor Oxygen saturation at home with home monitor if able to obtain.  Go to ER if O2 sat < 90% on room air.   Reviewed home care and provided information through MyChart.  Recommended quarantine 5 days isolation recommended. Return to work day 6 and wear mask for 4 more days to complete 10 days. Provided info about prevention of spread of COVID 19.

## 2021-03-31 ENCOUNTER — Other Ambulatory Visit: Payer: Self-pay | Admitting: Family Medicine

## 2021-04-23 ENCOUNTER — Ambulatory Visit: Payer: 59 | Admitting: Cardiovascular Disease

## 2021-06-11 LAB — CBC AND DIFFERENTIAL
HCT: 49 (ref 41–53)
Hemoglobin: 16.3 (ref 13.5–17.5)
Neutrophils Absolute: 2.1
Platelets: 299 10*3/uL (ref 150–400)
WBC: 4.1

## 2021-06-11 LAB — COMPREHENSIVE METABOLIC PANEL
Albumin: 5 (ref 3.5–5.0)
Calcium: 9.6 (ref 8.7–10.7)
Globulin: 2.3

## 2021-06-11 LAB — BASIC METABOLIC PANEL
BUN: 19 (ref 4–21)
Chloride: 104 (ref 99–108)
Creatinine: 1.1 (ref 0.6–1.3)
Glucose: 106
Potassium: 4.6 mEq/L (ref 3.5–5.1)
Sodium: 141 (ref 137–147)

## 2021-06-11 LAB — LIPID PANEL
Cholesterol: 160 (ref 0–200)
HDL: 66 (ref 35–70)
LDL Cholesterol: 85
LDl/HDL Ratio: 2.4
Triglycerides: 41 (ref 40–160)

## 2021-06-11 LAB — HEPATIC FUNCTION PANEL
ALT: 33 U/L (ref 10–40)
AST: 30 (ref 14–40)
Alkaline Phosphatase: 72 (ref 25–125)
Bilirubin, Total: 0.9

## 2021-06-11 LAB — CBC: RBC: 5.69 — AB (ref 3.87–5.11)

## 2021-06-11 LAB — TSH: TSH: 1.24 (ref 0.41–5.90)

## 2021-06-16 ENCOUNTER — Telehealth: Payer: Self-pay

## 2021-06-16 MED ORDER — AMOXICILLIN 500 MG PO CAPS: ORAL_CAPSULE | ORAL | 3 refills | Status: AC

## 2021-06-16 NOTE — Telephone Encounter (Signed)
? ?  Pre-operative Risk Assessment  ?  ?Patient Name: Lawrence Underwood  ?DOB: 1965/10/07 ?MRN: [id number]  ? ? ? ?Request for Surgical Clearance   ? ?Procedure:   Crown ? ?Date of Surgery:  Clearance TBD                              ?   ?Surgeon:  Dr. Arleta Creek  ?Surgeon's Group or Practice Name:  [facility] ?Phone number:  [phone] ?Fax number:  [phone] ?  ?Type of Clearance Requested:   ?- Medical  ?- Pharmacy:  Hold Aspirin 81 mg? ?  ?Type of Anesthesia:  Local  ?  ?Additional requests/questions:   ? ?Signed, ?Orine Goga   ?06/16/2021, 9:30 AM   ?

## 2021-06-16 NOTE — Telephone Encounter (Signed)
Please advise pt rx for amoxicillin has been sent to his pharmacy. ?

## 2021-06-16 NOTE — Telephone Encounter (Signed)
? ?  Patient Name: Lawrence Underwood  ?DOB: 03-18-1966 ?MRN: [id number] ? ?Primary Cardiologist: Sherren Mocha, MD ? ?Chart reviewed as part of pre-operative protocol coverage.  ? ?Simple (1-2 teeth) dental extractions, crown and cleaning are considered low risk procedures per guidelines and generally do not require any specific cardiac clearance. It is also generally accepted that for simple extractions and dental cleanings, there is no need to interrupt blood thinner therapy. ? ?SBE prophylaxis is required for the patient from a cardiac standpoint. ? ?Clinical pharmacist to review SBE prophylaxis given history of minimally invasive mitral valve repair ? ?I will route this recommendation to the requesting party via Epic fax function and remove from pre-op pool. ? ?Please call with questions. ? ?Almyra Deforest, [place] ?06/16/2021, 10:50 AM ? ?

## 2021-06-18 ENCOUNTER — Encounter: Payer: Self-pay | Admitting: Family Medicine

## 2021-06-18 ENCOUNTER — Ambulatory Visit (INDEPENDENT_AMBULATORY_CARE_PROVIDER_SITE_OTHER): Payer: 59 | Admitting: Family Medicine

## 2021-06-18 VITALS — BP 118/64 | HR 68 | Temp 98.1°F | Ht 69.5 in | Wt 162.1 lb

## 2021-06-18 DIAGNOSIS — Z0000 Encounter for general adult medical examination without abnormal findings: Secondary | ICD-10-CM | POA: Diagnosis not present

## 2021-06-18 DIAGNOSIS — I2510 Atherosclerotic heart disease of native coronary artery without angina pectoris: Secondary | ICD-10-CM

## 2021-06-18 DIAGNOSIS — E782 Mixed hyperlipidemia: Secondary | ICD-10-CM

## 2021-06-18 DIAGNOSIS — M109 Gout, unspecified: Secondary | ICD-10-CM | POA: Diagnosis not present

## 2021-06-18 NOTE — Progress Notes (Signed)
? ? Patient ID: Lawrence Underwood, male    DOB: 06-Jun-1965, 56 y.o.   MRN: [id number] ? ?This visit was conducted in person. ? ?BP 118/64   Pulse 68   Temp 98.1 ?F (36.7 ?C) (Oral)   Ht 5' 9.5" (1.765 m)   Wt 162 lb 2 oz (73.5 kg)   SpO2 97%   BMI 23.60 kg/m?   ? ?CC: ?Chief Complaint  ?Patient presents with  ? Annual Exam  ? ? ?Subjective:  ? ?HPI: ?Lawrence Underwood is a 56 y.o. male presenting on 06/18/2021 for Annual Exam ? ?Wt Readings from Last 3 Encounters:  ?06/18/21 162 lb 2 oz (73.5 kg)  ?03/11/21 160 lb (72.6 kg)  ?03/03/21 163 lb 3.2 oz (74 kg)  ?Body mass index is 23.6 kg/m?. ? ? ?Reviewed scanned labs in detail today. ? Glucose in prediabetes range 106 ? ?Cholesterol good control on crestor 5 mg  daily. ?Has has history of mitral valve repair followed by cardiology. ?Total chol 160 ?HDL 66 LDL 85 ?TSH 1.240 ?Hg16.3 ?   ?Gout  No flares in last year on allopurinol. Uric 6.2 ? ? Regular exercise: daily ? Diet; healthy eating. ? ?Relevant past medical, surgical, family and social history reviewed and updated as indicated. Interim medical history since our last visit reviewed. ?Allergies and medications reviewed and updated. ?Outpatient Medications Prior to Visit  ?Medication Sig Dispense Refill  ? allopurinol (ZYLOPRIM) 100 MG tablet TAKE 1 TABLET BY MOUTH EVERY DAY 90 tablet 0  ? amoxicillin (AMOXIL) 500 MG capsule Take 4 capsules (2,000mg ) by mouth 30-60 minutes prior to dental work and cleanings 4 capsule 3  ? aspirin EC 81 MG tablet Take 1 tablet (81 mg total) by mouth daily.    ? rosuvastatin (CRESTOR) 5 MG tablet Take 1 tablet (5 mg total) by mouth daily. 90 tablet 3  ? ?Facility-Administered Medications Prior to Visit  ?Medication Dose Route Frequency Provider Last Rate Last Admin  ? 0.9 %  sodium chloride infusion  500 mL Intravenous Once Tressia Danas, MD      ?  ? ?Per HPI unless specifically indicated in ROS section below ?Review of Systems ?Objective:  ?BP 118/64   Pulse 68   Temp 98.1 ?F  (36.7 ?C) (Oral)   Ht 5' 9.5" (1.765 m)   Wt 162 lb 2 oz (73.5 kg)   SpO2 97%   BMI 23.60 kg/m?   ?Wt Readings from Last 3 Encounters:  ?06/18/21 162 lb 2 oz (73.5 kg)  ?03/11/21 160 lb (72.6 kg)  ?03/03/21 163 lb 3.2 oz (74 kg)  ?  ?  ?Physical Exam ?   ?Results for orders placed or performed in visit on 10/08/20  ?Comprehensive metabolic panel  ?Result Value Ref Range  ? Sodium 144 135 - 145 mEq/L  ? Potassium 4.6 3.5 - 5.1 mEq/L  ? Chloride 106 96 - 112 mEq/L  ? CO2 27 19 - 32 mEq/L  ? Glucose, Bld 93 70 - 99 mg/dL  ? BUN 27 (H) 6 - 23 mg/dL  ? Creatinine, Ser 1.24 0.40 - 1.50 mg/dL  ? Total Bilirubin 1.1 0.2 - 1.2 mg/dL  ? Alkaline Phosphatase 62 39 - 117 U/L  ? AST 29 0 - 37 U/L  ? ALT 30 0 - 53 U/L  ? Total Protein 7.3 6.0 - 8.3 g/dL  ? Albumin 5.0 3.5 - 5.2 g/dL  ? GFR 66.72 >60.00 mL/min  ? Calcium 9.7 8.4 - 10.5 mg/dL  ?Lipid panel  ?  Result Value Ref Range  ? Cholesterol 177 0 - 200 mg/dL  ? Triglycerides 62.0 0.0 - 149.0 mg/dL  ? HDL 74.00 >39.00 mg/dL  ? VLDL 12.4 0.0 - 40.0 mg/dL  ? LDL Cholesterol 90 0 - 99 mg/dL  ? Total CHOL/HDL Ratio 2   ? NonHDL 102.70   ?PSA  ?Result Value Ref Range  ? PSA 1.29 0.10 - 4.00 ng/mL  ?Hepatitis C antibody  ?Result Value Ref Range  ? Hepatitis C Ab NON-REACTIVE NON-REACTIVE  ? SIGNAL TO CUT-OFF 0.01 <1.00  ? ? ?This visit occurred during the SARS-CoV-2 public health emergency.  Safety protocols were in place, including screening questions prior to the visit, additional usage of staff PPE, and extensive cleaning of exam room while observing appropriate contact time as indicated for disinfecting solutions.  ? ?COVID 19 screen:  No recent travel or known exposure to COVID19 ?The patient denies respiratory symptoms of COVID 19 at this time. ?The importance of social distancing was discussed today.  ? ?Assessment and Plan ? ? The patient's preventative maintenance and recommended screening tests for an annual wellness exam were reviewed in full today. ?Brought up to date  unless services declined. ? ?Counselled on the importance of diet, exercise, and its role in overall health and mortality. ?The patient's FH and SH was reviewed, including their home life, tobacco status, and drug and alcohol status.  ? ?Vaccines: uptodate td, COVID vaccine x 3 , consider shingrix ?Prostate Cancer Screen:  PSA due after 09/2021 ?Colon Cancer Screen:   09/2018 repeat in 10 years.  ?  ?  ?Smoking Status:none ?ETOH/ drug use:  ETOH on weekends/none ?  HIV screen:   refused. ?Problem List Items Addressed This Visit   ? ? Coronary artery disease  ?  Statin indicated.  Followed by cardiology ?  ?  ? Gout  ?  Chronic, well controlled on allopurinol 100 mg daily. ?Uric acid at goal. ?  ?  ? HYPERLIPIDEMIA  ?  Chronic, well controlled ?Continue Crestor 5 mg daily. ?  ?  ? ?Other Visit Diagnoses   ? ? Routine general medical examination at a health care facility    -  Primary  ? ?  ? ? ? ?[name], MD  ? ?

## 2021-06-18 NOTE — Assessment & Plan Note (Signed)
Chronic, well controlled on allopurinol 100 mg daily. ?Uric acid at goal. ?

## 2021-06-18 NOTE — Patient Instructions (Addendum)
Look into PSA.. prostate cancer screening. ? ?

## 2021-06-18 NOTE — Assessment & Plan Note (Signed)
Statin indicated.  Followed by cardiology ?

## 2021-06-18 NOTE — Assessment & Plan Note (Signed)
Chronic, well controlled ?Continue Crestor 5 mg daily. ?

## 2021-06-30 ENCOUNTER — Other Ambulatory Visit: Payer: Self-pay | Admitting: Family Medicine

## 2021-12-20 ENCOUNTER — Ambulatory Visit: Payer: 59 | Admitting: Cardiovascular Disease

## 2021-12-21 ENCOUNTER — Ambulatory Visit: Payer: 59 | Attending: Cardiovascular Disease | Admitting: Cardiovascular Disease

## 2021-12-21 ENCOUNTER — Encounter: Payer: Self-pay | Admitting: Cardiovascular Disease

## 2021-12-21 VITALS — BP 130/90 | HR 67 | Ht 70.0 in | Wt 172.0 lb

## 2021-12-21 DIAGNOSIS — E782 Mixed hyperlipidemia: Secondary | ICD-10-CM

## 2021-12-21 DIAGNOSIS — Z9889 Other specified postprocedural states: Secondary | ICD-10-CM | POA: Diagnosis not present

## 2021-12-21 DIAGNOSIS — I2510 Atherosclerotic heart disease of native coronary artery without angina pectoris: Secondary | ICD-10-CM | POA: Diagnosis not present

## 2021-12-21 NOTE — Patient Instructions (Signed)
Medication Instructions:  Your physician recommends that you continue on your current medications as directed. Please refer to the Current Medication list given to you today.  *If you need a refill on your cardiac medications before your next appointment, please call your pharmacy*   Lab Work: NONE If you have labs (blood work) drawn today and your tests are completely normal, you will receive your results only by: MyChart Message (if you have MyChart) OR A paper copy in the mail If you have any lab test that is abnormal or we need to change your treatment, we will call you to review the results.   Testing/Procedures: NONE  Follow-Up: At [facility], you and your health needs are our priority.  As part of our continuing mission to provide you with exceptional heart care, we have created designated Provider Care Teams.  These Care Teams include your primary Cardiologist (physician) and Advanced Practice Providers (APPs -  Physician Assistants and Nurse Practitioners) who all work together to provide you with the care you need, when you need it.  Your next appointment:   1 year(s)  The format for your next appointment:   In Person  Provider:   Michael Cooper, MD       Important Information About Sugar       

## 2021-12-21 NOTE — Progress Notes (Signed)
Cardiology Office Note:    Date:  12/21/2021   ID:  Lawrence Underwood, DOB 08/18/1965, MRN [id number]  PCP:  Excell Seltzer, MD   [facility] Providers Cardiologist:  Tonny Bollman, MD Cardiology APP:  Kennon Rounds     Referring MD: Excell Seltzer, MD   Chief Complaint  Patient presents with   Follow-up    History of Present Illness:    Lawrence Underwood is a 56 y.o. male with a hx of: MVP with mitral regurgitation  S/p min invasive mitral valve repair 09/2013 Non-obs Coronary artery disease  Hyperlipidemia  The patient is here alone today.  He has been doing great.  He continues to exercise vigorously, walking briskly 5 to 6 miles every day with no exertional symptoms.  He denies chest pain, chest pressure, or shortness of breath.  He follows his nutrition closely and has maintained his weight loss over time.  He reports no changes in his medications.  He has no complaints today.  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   Complex valvuloplasty including triangular resection of flail posterior leaflet, artificial Gore-tex neocord placement x4, Sorin Memo 3D ring annuloplasty (size 34 mm) via right mini thoracotomy approach    Past Surgical History:  Procedure Laterality Date   CARDIAC CATHETERIZATION  09/18/13   HAND SURGERY     R hand   INTRAOPERATIVE TRANSESOPHAGEAL ECHOCARDIOGRAM N/A 10/11/2013    Procedure: INTRAOPERATIVE TRANSESOPHAGEAL ECHOCARDIOGRAM;  Surgeon: Purcell Nails, MD;  Location[facility] OR;  Service: Open Heart Surgery;  Laterality: N/A;   KNEE SURGERY Left    arthroscopy   LEFT AND RIGHT HEART CATHETERIZATION WITH CORONARY ANGIOGRAM N/A 09/18/2013   Procedure: LEFT AND RIGHT HEART CATHETERIZATION WITH CORONARY ANGIOGRAM;  Surgeon: Micheline Chapman, MD;  Location: [facility] CATH LAB;  Service: Cardiovascular;  Laterality: N/A;   MITRAL VALVE REPAIR Right 10/11/2013   Procedure: MINIMALLY INVASIVE MITRAL VALVE REPAIR (MVR);  Surgeon: Purcell Nails, MD;  Location: [facility] OR;  Service: Open Heart Surgery;  Laterality: Right;   TEE WITHOUT CARDIOVERSION N/A 08/26/2013   Procedure: TRANSESOPHAGEAL ECHOCARDIOGRAM (TEE);  Surgeon: [name], MD;  Location[facility] ENDOSCOPY;  Service: Cardiovascular;  Laterality: N/A;   WISDOM TOOTH EXTRACTION      Current Medications: Current Meds  Medication Sig   allopurinol (ZYLOPRIM) 100 MG tablet TAKE 1 TABLET BY MOUTH EVERY DAY   amoxicillin (AMOXIL) 500 MG capsule Take 4 capsules (2,000mg ) by mouth 30-60 minutes prior to dental work and cleanings   aspirin EC 81 MG tablet Take 1 tablet (81 mg total) by mouth daily.   rosuvastatin (CRESTOR) 5 MG tablet Take 1 tablet (5 mg total) by mouth daily.   Current Facility-Administered Medications for the 12/21/21 encounter (Office Visit) with Tonny Bollman, MD  Medication  0.9 %  sodium chloride infusion     Allergies:   Lisinopril   Social History   Socioeconomic History   Marital status: Married    Spouse name: Not on file   Number of children: Not on file   Years of education: Not on file   Highest education level: Not on file  Occupational History   Occupation: [employer]  Tobacco Use   Smoking status: Never   Smokeless tobacco: Never  Vaping Use   Vaping Use: Never used  Substance and Sexual Activity   Alcohol use: Yes    Alcohol/week: 3.0 - 4.0 standard drinks of alcohol    Types: 3 -  4 Standard drinks or equivalent per week    Comment: weekends   Drug use: No   Sexual activity: Yes  Other Topics Concern   Not on file  Social History Narrative   Not on file   Social Determinants of Health   Financial Resource Strain: Not on file  Food Insecurity: Not on file  Transportation Needs: Not on file  Physical Activity: Not on file  Stress: Not on file  Social Connections: Not on file     Family History: The patient's family history includes CVA (age of onset: 67) in his father; Gout in his father, maternal grandfather, maternal grandmother, paternal grandfather, and paternal grandmother; Healthy in his sister and sister; Heart attack (age of onset: 39) in his father; Hyperlipidemia in his mother.  ROS:   Please see the history of present illness.    All other systems reviewed and are negative.  EKGs/Labs/Other Studies Reviewed:    The following studies were reviewed today: 2D Echo 09/19/2019: 1. Normal LVEF, normal and stable transmitral gradients, mean 3 mmHg.   2. Left ventricular ejection fraction, by estimation, is 55 to 60%. The  left ventricle has normal function. The left ventricle has no regional  wall motion abnormalities. There is mild concentric left ventricular  hypertrophy. Left ventricular diastolic  function could not be evaluated.   3. Right ventricular systolic function is normal. The right ventricular  size is normal.   4. The mitral valve has been repaired/replaced. Trivial mitral valve  regurgitation. No evidence of mitral stenosis. The mean mitral valve  gradient is 3.0 mmHg with average heart rate of 67 bpm. There is a 34  prosthetic annuloplasty ring present in the  mitral position. Procedure Date: 2015.   5. The aortic valve is normal in structure. Aortic valve regurgitation is  not visualized. No aortic stenosis is present.   6. The inferior vena cava is normal in size with greater than 50%  respiratory variability, suggesting right  atrial pressure of 3 mmHg.   EKG:  EKG is ordered today.  The ekg ordered today demonstrates normal sinus rhythm 67 bpm, within normal limits.  Recent Labs: 06/11/2021: ALT 33; BUN 19; Creatinine 1.1; Hemoglobin 16.3; Platelets 299; Potassium 4.6; Sodium 141; TSH 1.24  Recent Lipid Panel    Component Value Date/Time   CHOL 160 06/11/2021 0000   CHOL 168 10/09/2018 1326   TRIG 41 06/11/2021 0000   HDL 66 06/11/2021 0000   HDL 58 10/09/2018 1326   CHOLHDL 2 10/08/2020 1348   VLDL 12.4 10/08/2020 1348   LDLCALC 85 06/11/2021 0000   LDLCALC 91 10/09/2018 1326     Risk Assessment/Calculations:            Physical Exam:    VS:  BP (!) 130/90 (BP Location: Left Arm,  Patient Position: Sitting, Cuff Size: Normal)   Pulse 67   Ht 5\' 10"  (1.778 m)   Wt 172 lb (78 kg)   SpO2 96%   BMI 24.68 kg/m     Wt Readings from Last 3 Encounters:  12/21/21 172 lb (78 kg)  06/18/21 162 lb 2 oz (73.5 kg)  03/11/21 160 lb (72.6 kg)     GEN:  Well nourished, well developed in no acute distress HEENT: Normal NECK: No JVD; No carotid bruits LYMPHATICS: No lymphadenopathy CARDIAC: RRR, no murmurs, rubs, gallops RESPIRATORY:  Clear to auscultation without rales, wheezing or rhonchi  ABDOMEN: Soft, non-tender, non-distended MUSCULOSKELETAL:  No edema; No deformity  SKIN: Warm and dry NEUROLOGIC:  Alert and oriented x 3 PSYCHIATRIC:  Normal affect   ASSESSMENT:    1. S/P minimally invasive mitral valve repair   2. Coronary artery disease involving native coronary artery of native heart without angina pectoris   3. Mixed hyperlipidemia    PLAN:    In order of problems listed above:  The patient has had stable echo findings with normal function of his mitral valve status postrepair.  He follows SBE prophylaxis per guideline recommendations.  He has NYHA functional class I symptoms.  Continue clinical follow-up.  He maintains an excellent exercise regimen. Patient with mild nonobstructive  CAD incidentally found on cardiac catheterization prior to surgery.  He is treated with a statin drug. Treated with a statin drug as above.  Last lipids with a cholesterol of 160, HDL 66, LDL 85.  Continue current therapy.  He has no obstructive CAD.           Medication Adjustments/Labs and Tests Ordered: Current medicines are reviewed at length with the patient today.  Concerns regarding medicines are outlined above.  Orders Placed This Encounter  Procedures   EKG 12-Lead   No orders of the defined types were placed in this encounter.   Patient Instructions  Medication Instructions:  Your physician recommends that you continue on your current medications as directed. Please refer to the Current Medication list given to you today.  *If you need a refill on your cardiac medications before your next appointment, please call your pharmacy*   Lab Work: NONE If you have labs (blood work) drawn today and your tests are completely normal, you will receive your results only by: [place] (if you have MyChart) OR A paper copy in the mail If you have any lab test that is abnormal or we need to change your treatment, we will call you to review the results.   Testing/Procedures: NONE   Follow-Up: At [facility], you and your health needs are our priority.  As part of our continuing mission to provide you with exceptional heart care, we have created designated Provider Care Teams.  These Care Teams include your primary Cardiologist (physician) and Advanced Practice Providers (APPs -  Physician Assistants and Nurse Practitioners) who all work together to provide you with the care you need, when you need it.  Your next appointment:   1 year(s)  The format for your next appointment:   In Person  Provider:   Sherren Mocha, MD       Important Information About Sugar         Signed, Sherren Mocha, MD  12/21/2021 1:41 PM    [place]

## 2022-03-13 ENCOUNTER — Other Ambulatory Visit: Payer: Self-pay | Admitting: Physician Assistant

## 2022-06-17 ENCOUNTER — Other Ambulatory Visit: Payer: Self-pay | Admitting: Family Medicine

## 2022-06-22 ENCOUNTER — Other Ambulatory Visit (INDEPENDENT_AMBULATORY_CARE_PROVIDER_SITE_OTHER): Payer: No Typology Code available for payment source

## 2022-06-22 ENCOUNTER — Telehealth: Payer: Self-pay | Admitting: Family Medicine

## 2022-06-22 DIAGNOSIS — E782 Mixed hyperlipidemia: Secondary | ICD-10-CM | POA: Diagnosis not present

## 2022-06-22 DIAGNOSIS — Z125 Encounter for screening for malignant neoplasm of prostate: Secondary | ICD-10-CM | POA: Diagnosis not present

## 2022-06-22 DIAGNOSIS — M109 Gout, unspecified: Secondary | ICD-10-CM | POA: Diagnosis not present

## 2022-06-22 NOTE — Telephone Encounter (Signed)
-----   Message from Ellamae Sia sent at 06/22/2022  8:29 AM EDT ----- Regarding: Lab orders for today Patient is scheduled for CPX labs, please order future labs, Thanks , Terri  Late add on patient

## 2022-06-23 LAB — COMPREHENSIVE METABOLIC PANEL
ALT: 25 U/L (ref 0–53)
AST: 26 U/L (ref 0–37)
Albumin: 4.7 g/dL (ref 3.5–5.2)
Alkaline Phosphatase: 60 U/L (ref 39–117)
BUN: 15 mg/dL (ref 6–23)
CO2: 26 mEq/L (ref 19–32)
Calcium: 9.3 mg/dL (ref 8.4–10.5)
Chloride: 108 mEq/L (ref 96–112)
Creatinine, Ser: 0.98 mg/dL (ref 0.40–1.50)
GFR: 87.43 mL/min (ref >60.00)
Glucose, Bld: 100 mg/dL — ABNORMAL HIGH (ref 70–99)
Potassium: 4 mEq/L (ref 3.5–5.1)
Sodium: 143 mEq/L (ref 135–145)
Total Bilirubin: 0.9 mg/dL (ref 0.2–1.2)
Total Protein: 6.8 g/dL (ref 6.0–8.3)

## 2022-06-23 LAB — LIPID PANEL
Cholesterol: 162 mg/dL (ref 0–200)
HDL: 58.4 mg/dL (ref >39.00)
LDL Cholesterol: 82 mg/dL (ref 0–99)
NonHDL: 103.24
Total CHOL/HDL Ratio: 3
Triglycerides: 107 mg/dL (ref 0.0–149.0)
VLDL: 21.4 mg/dL (ref 0.0–40.0)

## 2022-06-23 LAB — URIC ACID: Uric Acid, Serum: 6 mg/dL (ref 4.0–7.8)

## 2022-06-23 LAB — PSA: PSA: 1.66 ng/mL (ref 0.10–4.00)

## 2022-06-23 NOTE — Progress Notes (Signed)
No critical labs need to be addressed urgently. We will discuss labs in detail at upcoming office visit.   

## 2022-06-24 ENCOUNTER — Encounter: Payer: Self-pay | Admitting: Family Medicine

## 2022-06-24 ENCOUNTER — Ambulatory Visit: Payer: No Typology Code available for payment source | Admitting: Family Medicine

## 2022-06-24 VITALS — BP 100/68 | HR 77 | Temp 98.2°F | Ht 69.5 in | Wt 170.2 lb

## 2022-06-24 DIAGNOSIS — E782 Mixed hyperlipidemia: Secondary | ICD-10-CM | POA: Diagnosis not present

## 2022-06-24 DIAGNOSIS — Z0000 Encounter for general adult medical examination without abnormal findings: Secondary | ICD-10-CM

## 2022-06-24 DIAGNOSIS — R7303 Prediabetes: Secondary | ICD-10-CM

## 2022-06-24 DIAGNOSIS — M109 Gout, unspecified: Secondary | ICD-10-CM | POA: Diagnosis not present

## 2022-06-24 NOTE — Progress Notes (Signed)
Patient ID: Tejay Zanfardino, male    DOB: 12-06-1965, 57 y.o.   MRN: [id number]  This visit was conducted in person.  BP 100/68   Pulse 77   Temp 98.2 F (36.8 C) (Temporal)   Ht 5' 9.5" (1.765 m)   Wt 170 lb 4 oz (77.2 kg)   SpO2 96%   BMI 24.78 kg/m    CC: Chief Complaint  Patient presents with   Annual Exam    Subjective:   HPI: Jersey Romel is a 57 y.o. male presenting on 06/24/2022 for Annual Exam  The patient presents for  complete physical and review of chronic health problems. He/She also has the following acute concerns today:none  Wt Readings from Last 3 Encounters:  06/24/22 170 lb 4 oz (77.2 kg)  12/21/21 172 lb (78 kg)  06/18/21 162 lb 2 oz (73.5 kg)  Body mass index is 24.78 kg/m.  Prediabetes:    Cholesterol good control on crestor 5 mg  daily. Lab Results  Component Value Date   CHOL 162 06/22/2022   HDL 58.40 06/22/2022   LDLCALC 82 06/22/2022   TRIG 107.0 06/22/2022   CHOLHDL 3 06/22/2022   The 10-year ASCVD risk score (Arnett DK, et al., 2019) is: 2.2%   Values used to calculate the score:     Age: 15 years     Sex: Male     Is Non-Hispanic African American: No     Diabetic: No     Tobacco smoker: No     Systolic Blood Pressure: 100 mmHg     Is BP treated: No     HDL Cholesterol: 58.4 mg/dL     Total Cholesterol: 162 mg/dL  Gout  No flares in last year on allopurinol. Uric 6.0   Regular exercise: daily  Diet:  heart healthy eating.  Relevant past medical, surgical, family and social history reviewed and updated as indicated. Interim medical history since our last visit reviewed. Allergies and medications reviewed and updated. Outpatient Medications Prior to Visit  Medication Sig Dispense Refill   allopurinol (ZYLOPRIM) 100 MG tablet TAKE 1 TABLET BY MOUTH EVERY DAY 90 tablet 0   amoxicillin (AMOXIL) 500 MG capsule Take 4 capsules (2,000mg ) by mouth 30-60 minutes prior to dental work and cleanings 4 capsule 3   rosuvastatin  (CRESTOR) 5 MG tablet Take 1 tablet (5 mg total) by mouth daily. 90 tablet 3   aspirin EC 81 MG tablet Take 1 tablet (81 mg total) by mouth daily.     Facility-Administered Medications Prior to Visit  Medication Dose Route Frequency Provider Last Rate Last Admin   0.9 %  sodium chloride infusion  500 mL Intravenous Once Tressia Danas, MD         Per HPI unless specifically indicated in ROS section below Review of Systems  Constitutional:  Negative for fatigue and fever.  HENT:  Negative for ear pain.   Eyes:  Negative for pain.  Respiratory:  Negative for cough and shortness of breath.   Cardiovascular:  Negative for chest pain, palpitations and leg swelling.  Gastrointestinal:  Negative for abdominal pain.  Genitourinary:  Negative for dysuria.  Musculoskeletal:  Negative for arthralgias.  Neurological:  Negative for syncope, light-headedness and headaches.  Psychiatric/Behavioral:  Negative for dysphoric mood.    Objective:  BP 100/68   Pulse 77   Temp 98.2 F (36.8 C) (Temporal)   Ht 5' 9.5" (1.765 m)   Wt 170 lb 4 oz (77.2 kg)  SpO2 96%   BMI 24.78 kg/m   Wt Readings from Last 3 Encounters:  06/24/22 170 lb 4 oz (77.2 kg)  12/21/21 172 lb (78 kg)  06/18/21 162 lb 2 oz (73.5 kg)      Physical Exam Constitutional:      Appearance: He is well-developed.  HENT:     Head: Normocephalic.     Right Ear: Hearing normal.     Left Ear: Hearing normal.     Nose: Nose normal.  Neck:     Thyroid: No thyroid mass or thyromegaly.     Vascular: No carotid bruit.     Trachea: Trachea normal.  Cardiovascular:     Rate and Rhythm: Normal rate and regular rhythm.     Pulses: Normal pulses.     Heart sounds: Heart sounds not distant. No murmur heard.    No friction rub. No gallop.     Comments: No peripheral edema Pulmonary:     Effort: Pulmonary effort is normal. No respiratory distress.     Breath sounds: Normal breath sounds.  Skin:    General: Skin is warm and dry.      Findings: No rash.  Psychiatric:        Speech: Speech normal.        Behavior: Behavior normal.        Thought Content: Thought content normal.       Results for orders placed or performed in visit on 06/22/22  Uric acid  Result Value Ref Range   Uric Acid, Serum 6.0 4.0 - 7.8 mg/dL  Comprehensive metabolic panel  Result Value Ref Range   Sodium 143 135 - 145 mEq/L   Potassium 4.0 3.5 - 5.1 mEq/L   Chloride 108 96 - 112 mEq/L   CO2 26 19 - 32 mEq/L   Glucose, Bld 100 (H) 70 - 99 mg/dL   BUN 15 6 - 23 mg/dL   Creatinine, Ser 1.610.98 0.40 - 1.50 mg/dL   Total Bilirubin 0.9 0.2 - 1.2 mg/dL   Alkaline Phosphatase 60 39 - 117 U/L   AST 26 0 - 37 U/L   ALT 25 0 - 53 U/L   Total Protein 6.8 6.0 - 8.3 g/dL   Albumin 4.7 3.5 - 5.2 g/dL   GFR 09.6087.43 >45.40>60.00 mL/min   Calcium 9.3 8.4 - 10.5 mg/dL  PSA  Result Value Ref Range   PSA 1.66 0.10 - 4.00 ng/mL  Lipid panel  Result Value Ref Range   Cholesterol 162 0 - 200 mg/dL   Triglycerides 981.1107.0 0.0 - 149.0 mg/dL   HDL 91.4758.40 >82.95>39.00 mg/dL   VLDL 62.121.4 0.0 - 30.840.0 mg/dL   LDL Cholesterol 82 0 - 99 mg/dL   Total CHOL/HDL Ratio 3    NonHDL 103.24     This visit occurred during the SARS-CoV-2 public health emergency.  Safety protocols were in place, including screening questions prior to the visit, additional usage of staff PPE, and extensive cleaning of exam room while observing appropriate contact time as indicated for disinfecting solutions.   COVID 19 screen:  No recent travel or known exposure to COVID19 The patient denies respiratory symptoms of COVID 19 at this time. The importance of social distancing was discussed today.   Assessment and Plan   The patient's preventative maintenance and recommended screening tests for an annual wellness exam were reviewed in full today. Brought up to date unless services declined.  Counselled on the importance of diet, exercise, and its role  in overall health and mortality. The patient's FH  and SH was reviewed, including their home life, tobacco status, and drug and alcohol status.   Vaccines: uptodate td, COVID vaccine x 4 , shingrix Prostate Cancer Screen:   Lab Results  Component Value Date   PSA 1.66 06/22/2022   PSA 1.29 10/08/2020  Colon Cancer Screen:   09/2018 repeat in 10 years.      Smoking Status:none ETOH/ drug use:  ETOH on weekends/none   HIV screen:   refused.  Problem List Items Addressed This Visit     Gout    Chronic, well controlled on allopurinol 100 mg daily. Uric acid at goal.      HYPERLIPIDEMIA    Chronic, well controlled Continue Crestor 5 mg daily.      Prediabetes    Will eval labs from work to see if A1C done.      Other Visit Diagnoses     Routine general medical examination at a health care facility    -  Primary       [name], MD

## 2022-07-07 NOTE — Assessment & Plan Note (Signed)
Will eval labs from work to see if A1C done. 

## 2022-07-07 NOTE — Assessment & Plan Note (Signed)
Chronic, well controlled ?Continue Crestor 5 mg daily. ?

## 2022-07-07 NOTE — Assessment & Plan Note (Signed)
Chronic, well controlled on allopurinol 100 mg daily. ?Uric acid at goal. ?

## 2022-09-27 ENCOUNTER — Other Ambulatory Visit: Payer: Self-pay | Admitting: Family Medicine

## 2022-11-25 ENCOUNTER — Telehealth: Payer: Self-pay | Admitting: Family Medicine

## 2022-11-25 NOTE — Telephone Encounter (Signed)
Patient has picked up forms. Requested labs to be faxed to insurance co for health assessment. Patient filled out medical release form and these were faxed to company.

## 2022-11-25 NOTE — Telephone Encounter (Signed)
Lawrence Underwood notified by telephone that lab results are ready to be picked up  at the front desk.

## 2022-11-25 NOTE — Telephone Encounter (Signed)
Patient called in wants to come by and pick up a copy of his labs that was done on 06/22/2022. And would like a call back when ready [phone]

## 2023-02-20 ENCOUNTER — Ambulatory Visit
Payer: No Typology Code available for payment source | Attending: Cardiovascular Disease | Admitting: Cardiovascular Disease

## 2023-02-20 ENCOUNTER — Encounter: Payer: Self-pay | Admitting: Cardiovascular Disease

## 2023-02-20 VITALS — BP 120/90 | HR 68 | Ht 70.0 in | Wt 176.4 lb

## 2023-02-20 DIAGNOSIS — I2510 Atherosclerotic heart disease of native coronary artery without angina pectoris: Secondary | ICD-10-CM

## 2023-02-20 DIAGNOSIS — Z9889 Other specified postprocedural states: Secondary | ICD-10-CM

## 2023-02-20 DIAGNOSIS — E782 Mixed hyperlipidemia: Secondary | ICD-10-CM | POA: Diagnosis not present

## 2023-02-20 MED ORDER — ROSUVASTATIN CALCIUM 5 MG PO TABS: 5.0000 mg | ORAL_TABLET | Freq: Every day | ORAL | 3 refills | Status: DC

## 2023-02-20 NOTE — Assessment & Plan Note (Signed)
The patient is doing very well. His exam remains normal. Most recent echo in 2021 showed normal function of his mitral valve s/p repair and normal LV function. He remains NYHA functional class 1.

## 2023-02-20 NOTE — Patient Instructions (Signed)
Follow-Up: At [facility], you and your health needs are our priority.  As part of our continuing mission to provide you with exceptional heart care, we have created designated Provider Care Teams.  These Care Teams include your primary Cardiologist (physician) and Advanced Practice Providers (APPs -  Physician Assistants and Nurse Practitioners) who all work together to provide you with the care you need, when you need it.  We recommend signing up for the patient portal called "MyChart".  Sign up information is provided on this After Visit Summary.  MyChart is used to connect with patients for Virtual Visits (Telemedicine).  Patients are able to view lab/test results, encounter notes, upcoming appointments, etc.  Non-urgent messages can be sent to your provider as well.   To learn more about what you can do with MyChart, go to [email or web address].    Your next appointment:   1 year(s)  Provider:   Tonny Bollman, MD

## 2023-02-20 NOTE — Assessment & Plan Note (Signed)
Treated with low dose rosuvastatin 5 mg daily based on tolerability. LDL 82.

## 2023-02-20 NOTE — Assessment & Plan Note (Signed)
Mild nonobstructive dz on past cath. No anginal symptoms. Continue statin Rx.

## 2023-02-20 NOTE — Progress Notes (Signed)
Cardiology Office Note:    Date:  02/20/2023   ID:  Lawrence Underwood, DOB October 14, 1965, MRN [id number]  PCP:  Excell Seltzer, MD   [facility] Providers Cardiologist:  Tonny Bollman, MD Cardiology APP:  Kennon Rounds     Referring MD: Excell Seltzer, MD   Chief Complaint  Patient presents with   Follow-up    Mitral Valve Disease    History of Present Illness:    Lawrence Underwood is a 57 y.o. 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 Today, he denies symptoms of palpitations, chest pain, shortness of breath, orthopnea, PND, lower extremity edema, dizziness, or syncope.he is walking for exercise and has no symptoms with that level of activity.    Current Medications: Current Meds  Medication Sig   allopurinol (ZYLOPRIM) 100 MG tablet TAKE 1 TABLET BY MOUTH EVERY DAY   amoxicillin (AMOXIL) 500 MG capsule Take 4 capsules (2,000mg ) by mouth 30-60 minutes prior to dental work and cleanings   rosuvastatin (CRESTOR) 5 MG tablet Take 1 tablet (5 mg total) by mouth daily.   [DISCONTINUED] rosuvastatin (CRESTOR) 5 MG tablet Take 1 tablet (5 mg total) by mouth daily.   Current Facility-Administered Medications for the 02/20/23 encounter (Office Visit) with Tonny Bollman, MD  Medication   0.9 %  sodium chloride infusion     Allergies:   Lisinopril   ROS:   Please see the history of present illness.    All other systems reviewed and are negative.  EKGs/Labs/Other Studies Reviewed:    The following studies were reviewed today: Cardiac Studies & Procedures       ECHOCARDIOGRAM  ECHOCARDIOGRAM COMPLETE 09/19/2019  Narrative ECHOCARDIOGRAM REPORT    Patient Name:   Lawrence Underwood Date of Exam: 09/19/2019 Medical Rec #:  [id number]      Height:       69.5 in Accession #:    [id number]     Weight:        158.2 lb Date of Birth:  Mar 13, 1966      BSA:          1.880 m Patient Age:    53 years       BP:           104/67 mmHg Patient Gender: M              HR:           70 bpm. Exam Location:  [address]  Procedure: 2D Echo, Cardiac Doppler and Color Doppler  Indications:    Z98.890 S/P Mitral valve repair  History:        Patient has prior history of Echocardiogram examinations, most recent 06/02/2014. CAD; Risk Factors:Dyslipidemia. Hypotension.  Mitral Valve: 34 prosthetic annuloplasty ring valve is present in the mitral position. Procedure Date: 2015.  Sonographer:    Cathie Beams RCS Referring Phys: Evern Bio WEAVER  IMPRESSIONS   1. Normal LVEF, normal and stable transmitral gradients, mean 3 mmHg. 2. Left ventricular ejection fraction, by estimation, is 55 to 60%. The left ventricle has normal function. The left ventricle has no regional wall motion abnormalities. There is mild concentric left ventricular hypertrophy. Left ventricular diastolic function could not be evaluated. 3. Right ventricular systolic function is normal. The right ventricular size is normal. 4. The mitral valve has been repaired/replaced.  Trivial mitral valve regurgitation. No evidence of mitral stenosis. The mean mitral valve gradient is 3.0 mmHg with average heart rate of 67 bpm. There is a 34 prosthetic annuloplasty ring present in the mitral position. Procedure Date: 2015. 5. The aortic valve is normal in structure. Aortic valve regurgitation is not visualized. No aortic stenosis is present. 6. The inferior vena cava is normal in size with greater than 50% respiratory variability, suggesting right atrial pressure of 3 mmHg.  FINDINGS Left Ventricle: Left ventricular ejection fraction, by estimation, is 55 to 60%. The left ventricle has normal function. The left ventricle has no regional wall motion abnormalities. The left ventricular internal cavity size was normal in size. There is mild concentric  left ventricular hypertrophy. Left ventricular diastolic function could not be evaluated due to mitral valve repair. Left ventricular diastolic function could not be evaluated.  Right Ventricle: The right ventricular size is normal. No increase in right ventricular wall thickness. Right ventricular systolic function is normal.  Left Atrium: Left atrial size was normal in size.  Right Atrium: Right atrial size was normal in size.  Pericardium: There is no evidence of pericardial effusion.  Mitral Valve: The mitral valve has been repaired/replaced. Normal mobility of the mitral valve leaflets. Trivial mitral valve regurgitation. There is a 34 prosthetic annuloplasty ring present in the mitral position. Procedure Date: 2015. No evidence of mitral valve stenosis. The mean mitral valve gradient is 3.0 mmHg with average heart rate of 67 bpm.  Tricuspid Valve: The tricuspid valve is normal in structure. Tricuspid valve regurgitation is mild . No evidence of tricuspid stenosis.  Aortic Valve: The aortic valve is normal in structure. Aortic valve regurgitation is not visualized. No aortic stenosis is present.  Pulmonic Valve: The pulmonic valve was normal in structure. Pulmonic valve regurgitation is not visualized. No evidence of pulmonic stenosis.  Aorta: The aortic root is normal in size and structure.  Venous: The inferior vena cava is normal in size with greater than 50% respiratory variability, suggesting right atrial pressure of 3 mmHg.  IAS/Shunts: No atrial level shunt detected by color flow Doppler.   LEFT VENTRICLE PLAX 2D LVIDd:         4.50 cm LVIDs:         3.20 cm LV PW:         1.10 cm LV IVS:        1.20 cm LVOT diam:     2.10 cm LV SV:         55 LV SV Index:   29 LVOT Area:     3.46 cm   RIGHT VENTRICLE RV Basal diam:  3.30 cm TAPSE (M-mode): 1.9 cm  LEFT ATRIUM             Index       RIGHT ATRIUM          Index LA diam:        2.30 cm 1.22 cm/m  RA Area:      9.97 cm LA Vol (A2C):   26.0 ml 13.83 ml/m RA Volume:   25.20 ml 13.41 ml/m LA Vol (A4C):   12.4 ml 6.60 ml/m LA Biplane Vol: 18.5 ml 9.84 ml/m AORTIC VALVE LVOT Vmax:   92.40 cm/s LVOT Vmean:  53.200 cm/s LVOT VTI:    0.159 m  AORTA Ao Root diam: 3.50 cm  MITRAL VALVE MV Area (PHT): 1.87 cm     SHUNTS MV Mean grad:  3.0 mmHg  Systemic VTI:  0.16 m MV Decel Time: 405 msec     Systemic Diam: 2.10 cm MV E velocity: 106.00 cm/s MV A velocity: 104.00 cm/s MV E/A ratio:  1.02  Tobias Alexander MD Electronically signed by Tobias Alexander MD Signature Date/Time: 10/02/2019/5:37:57 PM    Final             EKG:   EKG Interpretation Date/Time:  Monday February 20 2023 15:17:28 EST Ventricular Rate:  68 PR Interval:  132 QRS Duration:  112 QT Interval:  418 QTC Calculation: 444 R Axis:   -32  Text Interpretation: Normal sinus rhythm Right atrial enlargement Left axis deviation When compared with ECG of 12-Oct-2013 06:49, Nonspecific T wave abnormality has replaced inverted T waves in Inferior leads Confirmed by Tonny Bollman [phone]) on 02/20/2023 3:30:09 PM    Recent Labs: 06/22/2022: ALT 25; BUN 15; Creatinine, Ser 0.98; Potassium 4.0; Sodium 143  Recent Lipid Panel    Component Value Date/Time   CHOL 162 06/22/2022 1433   CHOL 168 10/09/2018 1326   TRIG 107.0 06/22/2022 1433   HDL 58.40 06/22/2022 1433   HDL 58 10/09/2018 1326   CHOLHDL 3 06/22/2022 1433   VLDL 21.4 06/22/2022 1433   LDLCALC 82 06/22/2022 1433   LDLCALC 91 10/09/2018 1326          Physical Exam:    VS:  BP (!) 120/90   Pulse 68   Ht 5\' 10"  (1.778 m)   Wt 176 lb 6.4 oz (80 kg)   SpO2 97%   BMI 25.31 kg/m     Wt Readings from Last 3 Encounters:  02/20/23 176 lb 6.4 oz (80 kg)  06/24/22 170 lb 4 oz (77.2 kg)  12/21/21 172 lb (78 kg)     GEN:  Well nourished, well developed in no acute distress HEENT: Normal NECK: No JVD; No carotid bruits LYMPHATICS: No  lymphadenopathy CARDIAC: RRR, no murmurs, rubs, gallops RESPIRATORY:  Clear to auscultation without rales, wheezing or rhonchi  ABDOMEN: Soft, non-tender, non-distended MUSCULOSKELETAL:  No edema; No deformity  SKIN: Warm and dry NEUROLOGIC:  Alert and oriented x 3 PSYCHIATRIC:  Normal affect   Assessment & Plan S/P minimally invasive mitral valve repair The patient is doing very well. His exam remains normal. Most recent echo in 2021 showed normal function of his mitral valve s/p repair and normal LV function. He remains NYHA functional class 1.  Coronary artery disease involving native coronary artery of native heart without angina pectoris Mild nonobstructive dz on past cath. No anginal symptoms. Continue statin Rx.  Mixed hyperlipidemia Treated with low dose rosuvastatin 5 mg daily based on tolerability. LDL 82.     Medication Adjustments/Labs and Tests Ordered: Current medicines are reviewed at length with the patient today.  Concerns regarding medicines are outlined above.  Orders Placed This Encounter  Procedures   EKG 12-Lead   Meds ordered this encounter  Medications   rosuvastatin (CRESTOR) 5 MG tablet    Sig: Take 1 tablet (5 mg total) by mouth daily.    Dispense:  90 tablet    Refill:  3    Patient Instructions  Follow-Up: At [facility], you and your health needs are our priority.  As part of our continuing mission to provide you with exceptional heart care, we have created designated Provider Care Teams.  These Care Teams include your primary Cardiologist (physician) and Advanced Practice Providers (APPs -  Physician Assistants and Nurse Practitioners) who all work together  to provide you with the care you need, when you need it.  We recommend signing up for the patient portal called "MyChart".  Sign up information is provided on this After Visit Summary.  MyChart is used to connect with patients for Virtual Visits (Telemedicine).  Patients are able to view  lab/test results, encounter notes, upcoming appointments, etc.  Non-urgent messages can be sent to your provider as well.   To learn more about what you can do with MyChart, go to [email or web address].    Your next appointment:   1 year(s)  Provider:   Tonny Bollman, MD        Signed, Tonny Bollman, MD  02/20/2023 5:52 PM    [facility]

## 2023-04-26 ENCOUNTER — Telehealth: Payer: Self-pay | Admitting: *Deleted

## 2023-04-26 DIAGNOSIS — M109 Gout, unspecified: Secondary | ICD-10-CM

## 2023-04-26 DIAGNOSIS — R7303 Prediabetes: Secondary | ICD-10-CM

## 2023-04-26 DIAGNOSIS — Z125 Encounter for screening for malignant neoplasm of prostate: Secondary | ICD-10-CM

## 2023-04-26 DIAGNOSIS — E782 Mixed hyperlipidemia: Secondary | ICD-10-CM

## 2023-04-26 NOTE — Telephone Encounter (Signed)
 Spoke to pt, scheduled cpe labs for 4/4

## 2023-04-26 NOTE — Telephone Encounter (Signed)
 Please call and schedule fasting lab appointment

## 2023-04-26 NOTE — Telephone Encounter (Signed)
 Copied from CRM [id number]. Topic: Appointments - Scheduling Inquiry for Clinic >> Apr 26, 2023 12:27 PM Lawrence Underwood wrote: Reason for CRM: Patient would like to know if Dr. Avelina could order his routine labs ahead of his physical appointment - He has something through work that is called [facility] due on next Wednesday and he would like to have his labs done in the next two days if this is possible. Please call patient to confirm.

## 2023-06-23 ENCOUNTER — Encounter: Payer: Self-pay | Admitting: Family Medicine

## 2023-06-23 ENCOUNTER — Other Ambulatory Visit (INDEPENDENT_AMBULATORY_CARE_PROVIDER_SITE_OTHER): Payer: No Typology Code available for payment source

## 2023-06-23 DIAGNOSIS — Z125 Encounter for screening for malignant neoplasm of prostate: Secondary | ICD-10-CM

## 2023-06-23 DIAGNOSIS — M109 Gout, unspecified: Secondary | ICD-10-CM

## 2023-06-23 DIAGNOSIS — E782 Mixed hyperlipidemia: Secondary | ICD-10-CM | POA: Diagnosis not present

## 2023-06-23 DIAGNOSIS — R7303 Prediabetes: Secondary | ICD-10-CM | POA: Diagnosis not present

## 2023-06-23 LAB — PSA: PSA: 2.52 ng/mL (ref 0.10–4.00)

## 2023-06-23 LAB — COMPREHENSIVE METABOLIC PANEL WITH GFR
ALT: 29 U/L (ref 0–53)
AST: 30 U/L (ref 0–37)
Albumin: 4.7 g/dL (ref 3.5–5.2)
Alkaline Phosphatase: 55 U/L (ref 39–117)
BUN: 20 mg/dL (ref 6–23)
CO2: 26 meq/L (ref 19–32)
Calcium: 9.3 mg/dL (ref 8.4–10.5)
Chloride: 106 meq/L (ref 96–112)
Creatinine, Ser: 1.03 mg/dL (ref 0.40–1.50)
GFR: 81.79 mL/min (ref >60.00)
Glucose, Bld: 126 mg/dL — ABNORMAL HIGH (ref 70–99)
Potassium: 4 meq/L (ref 3.5–5.1)
Sodium: 143 meq/L (ref 135–145)
Total Bilirubin: 0.8 mg/dL (ref 0.2–1.2)
Total Protein: 6.6 g/dL (ref 6.0–8.3)

## 2023-06-23 LAB — LIPID PANEL
Cholesterol: 144 mg/dL (ref 0–200)
HDL: 61.8 mg/dL (ref >39.00)
LDL Cholesterol: 71 mg/dL (ref 0–99)
NonHDL: 81.93
Total CHOL/HDL Ratio: 2
Triglycerides: 55 mg/dL (ref 0.0–149.0)
VLDL: 11 mg/dL (ref 0.0–40.0)

## 2023-06-23 LAB — HEMOGLOBIN A1C: Hgb A1c MFr Bld: 6.2 % (ref 4.6–6.5)

## 2023-06-23 LAB — URIC ACID: Uric Acid, Serum: 6.4 mg/dL (ref 4.0–7.8)

## 2023-06-23 NOTE — Progress Notes (Signed)
 No critical labs need to be addressed urgently. We will discuss labs in detail at upcoming office visit.

## 2023-06-30 ENCOUNTER — Ambulatory Visit (INDEPENDENT_AMBULATORY_CARE_PROVIDER_SITE_OTHER): Payer: No Typology Code available for payment source | Admitting: Family Medicine

## 2023-06-30 ENCOUNTER — Encounter: Payer: Self-pay | Admitting: Family Medicine

## 2023-06-30 VITALS — BP 100/70 | HR 79 | Temp 97.8°F | Ht 69.0 in | Wt 160.2 lb

## 2023-06-30 DIAGNOSIS — M109 Gout, unspecified: Secondary | ICD-10-CM | POA: Diagnosis not present

## 2023-06-30 DIAGNOSIS — R7303 Prediabetes: Secondary | ICD-10-CM | POA: Diagnosis not present

## 2023-06-30 DIAGNOSIS — Z0000 Encounter for general adult medical examination without abnormal findings: Secondary | ICD-10-CM | POA: Diagnosis not present

## 2023-06-30 DIAGNOSIS — E782 Mixed hyperlipidemia: Secondary | ICD-10-CM

## 2023-06-30 DIAGNOSIS — R972 Elevated prostate specific antigen [PSA]: Secondary | ICD-10-CM

## 2023-06-30 DIAGNOSIS — I2510 Atherosclerotic heart disease of native coronary artery without angina pectoris: Secondary | ICD-10-CM

## 2023-06-30 NOTE — Assessment & Plan Note (Signed)
Chronic, well controlled on allopurinol 100 mg daily. ?Uric acid at goal. ?

## 2023-06-30 NOTE — Assessment & Plan Note (Signed)
 New , significant rise.Marland Kitchen re-eval in 3 months with free and total PSA.

## 2023-06-30 NOTE — Progress Notes (Signed)
 Patient ID: Carmelo Reidel, male    DOB: Apr 23, 1965, 58 y.o.   MRN: [id number]  This visit was conducted in person.  BP 100/70 (BP Location: Left Arm, Patient Position: Sitting, Cuff Size: Normal)   Pulse 79   Temp 97.8 F (36.6 C) (Temporal)   Ht 5\' 9"  (1.753 m)   Wt 160 lb 4 oz (72.7 kg)   SpO2 97%   BMI 23.66 kg/m    CC: Chief Complaint  Patient presents with   Annual Exam    Subjective:   HPI: Yohannes Waibel is a 58 y.o. male presenting on 06/30/2023 for Annual Exam  The patient presents for  complete physical and review of chronic health problems. He/She also has the following acute concerns today:none  Wt Readings from Last 3 Encounters:  06/30/23 160 lb 4 oz (72.7 kg)  02/20/23 176 lb 6.4 oz (80 kg)  06/24/22 170 lb 4 oz (77.2 kg)  Body mass index is 23.66 kg/m.  Prediabetes:  Blood sugar was 126 Lab Results  Component Value Date   HGBA1C 6.2 06/23/2023     Cholesterol good control on crestor 5 mg  daily. Lab Results  Component Value Date   CHOL 144 06/23/2023   HDL 61.80 06/23/2023   LDLCALC 71 06/23/2023   TRIG 55.0 06/23/2023   CHOLHDL 2 06/23/2023   The 10-year ASCVD risk score (Arnett DK, et al., 2019) is: 2.1%   Values used to calculate the score:     Age: 60 years     Sex: Male     Is Non-Hispanic African American: No     Diabetic: No     Tobacco smoker: No     Systolic Blood Pressure: 100 mmHg     Is BP treated: No     HDL Cholesterol: 61.8 mg/dL     Total Cholesterol: 144 mg/dL  Gout  No flares in last year on allopurinol. Uric 6.0   Regular exercise: daily  Diet:  heart healthy eating.  Relevant past medical, surgical, family and social history reviewed and updated as indicated. Interim medical history since our last visit reviewed. Allergies and medications reviewed and updated. Outpatient Medications Prior to Visit  Medication Sig Dispense Refill   allopurinol (ZYLOPRIM) 100 MG tablet TAKE 1 TABLET BY MOUTH EVERY DAY 90  tablet 2   amoxicillin (AMOXIL) 500 MG capsule Take 4 capsules (2,000mg ) by mouth 30-60 minutes prior to dental work and cleanings 4 capsule 3   rosuvastatin (CRESTOR) 5 MG tablet Take 1 tablet (5 mg total) by mouth daily. 90 tablet 3   0.9 %  sodium chloride infusion      No facility-administered medications prior to visit.     Per HPI unless specifically indicated in ROS section below Review of Systems  Constitutional:  Negative for fatigue and fever.  HENT:  Negative for ear pain.   Eyes:  Negative for pain.  Respiratory:  Negative for cough and shortness of breath.   Cardiovascular:  Negative for chest pain, palpitations and leg swelling.  Gastrointestinal:  Negative for abdominal pain.  Genitourinary:  Negative for dysuria.  Musculoskeletal:  Negative for arthralgias.  Neurological:  Negative for syncope, light-headedness and headaches.  Psychiatric/Behavioral:  Negative for dysphoric mood.    Objective:  BP 100/70 (BP Location: Left Arm, Patient Position: Sitting, Cuff Size: Normal)   Pulse 79   Temp 97.8 F (36.6 C) (Temporal)   Ht 5\' 9"  (1.753 m)   Wt 160 lb 4 oz (  72.7 kg)   SpO2 97%   BMI 23.66 kg/m   Wt Readings from Last 3 Encounters:  06/30/23 160 lb 4 oz (72.7 kg)  02/20/23 176 lb 6.4 oz (80 kg)  06/24/22 170 lb 4 oz (77.2 kg)      Physical Exam Constitutional:      Appearance: He is well-developed.  HENT:     Head: Normocephalic.     Right Ear: Hearing normal.     Left Ear: Hearing normal.     Nose: Nose normal.  Neck:     Thyroid: No thyroid mass or thyromegaly.     Vascular: No carotid bruit.     Trachea: Trachea normal.  Cardiovascular:     Rate and Rhythm: Normal rate and regular rhythm.     Pulses: Normal pulses.     Heart sounds: Heart sounds not distant. No murmur heard.    No friction rub. No gallop.     Comments: No peripheral edema Pulmonary:     Effort: Pulmonary effort is normal. No respiratory distress.     Breath sounds: Normal  breath sounds.  Skin:    General: Skin is warm and dry.     Findings: No rash.  Psychiatric:        Speech: Speech normal.        Behavior: Behavior normal.        Thought Content: Thought content normal.       Results for orders placed or performed in visit on 06/23/23  Hemoglobin A1c   Collection Time: 06/23/23  7:43 AM  Result Value Ref Range   Hgb A1c MFr Bld 6.2 4.6 - 6.5 %  Uric acid   Collection Time: 06/23/23  7:43 AM  Result Value Ref Range   Uric Acid, Serum 6.4 4.0 - 7.8 mg/dL  PSA   Collection Time: 06/23/23  7:43 AM  Result Value Ref Range   PSA 2.52 0.10 - 4.00 ng/mL  Lipid panel   Collection Time: 06/23/23  7:43 AM  Result Value Ref Range   Cholesterol 144 0 - 200 mg/dL   Triglycerides 28.3 0.0 - 149.0 mg/dL   HDL 15.17 >61.60 mg/dL   VLDL 73.7 0.0 - 10.6 mg/dL   LDL Cholesterol 71 0 - 99 mg/dL   Total CHOL/HDL Ratio 2    NonHDL 81.93   Comprehensive metabolic panel   Collection Time: 06/23/23  7:43 AM  Result Value Ref Range   Sodium 143 135 - 145 mEq/L   Potassium 4.0 3.5 - 5.1 mEq/L   Chloride 106 96 - 112 mEq/L   CO2 26 19 - 32 mEq/L   Glucose, Bld 126 (H) 70 - 99 mg/dL   BUN 20 6 - 23 mg/dL   Creatinine, Ser 2.69 0.40 - 1.50 mg/dL   Total Bilirubin 0.8 0.2 - 1.2 mg/dL   Alkaline Phosphatase 55 39 - 117 U/L   AST 30 0 - 37 U/L   ALT 29 0 - 53 U/L   Total Protein 6.6 6.0 - 8.3 g/dL   Albumin 4.7 3.5 - 5.2 g/dL   GFR 48.54 >62.70 mL/min   Calcium 9.3 8.4 - 10.5 mg/dL    This visit occurred during the SARS-CoV-2 public health emergency.  Safety protocols were in place, including screening questions prior to the visit, additional usage of staff PPE, and extensive cleaning of exam room while observing appropriate contact time as indicated for disinfecting solutions.   COVID 19 screen:  No recent travel or known  exposure to COVID19 The patient denies respiratory symptoms of COVID 19 at this time. The importance of social distancing was discussed  today.   Assessment and Plan   The patient's preventative maintenance and recommended screening tests for an annual wellness exam were reviewed in full today. Brought up to date unless services declined.  Counselled on the importance of diet, exercise, and its role in overall health and mortality. The patient's FH and SH was reviewed, including their home life, tobacco status, and drug and alcohol status.   Vaccines: uptodate td, COVID vaccine x 4 , shingrix Prostate Cancer Screen:   Lab Results  Component Value Date   PSA 2.52 06/23/2023   PSA 1.66 06/22/2022   PSA 1.29 10/08/2020  Colon Cancer Screen:   09/2018 repeat in 10 years.      Smoking Status:none ETOH/ drug use:  ETOH on weekends/none   HIV screen:   refused.  Problem List Items Addressed This Visit     Coronary artery disease   On statin.  Followed by cardiology      Elevated PSA   New , significant rise.Marland Kitchen re-eval in 3 months with free and total PSA.      Relevant Orders   PSA, total and free   Gout   Chronic, well controlled on allopurinol 100 mg daily. Uric acid at goal.      HYPERLIPIDEMIA   Chronic, well controlled Continue Crestor 5 mg daily.      Prediabetes   Increased from last check...  glucose 126 fasting... will stop sugar in coffee and recheck in 3 months. Continue healthy eating and regular exercise.      Relevant Orders   Hemoglobin A1c   Basic Metabolic Panel   Other Visit Diagnoses       Routine general medical examination at a health care facility    -  Primary        [name], MD

## 2023-06-30 NOTE — Assessment & Plan Note (Signed)
Chronic, well controlled ?Continue Crestor 5 mg daily. ?

## 2023-06-30 NOTE — Assessment & Plan Note (Addendum)
On statin. Followed by cardiology

## 2023-06-30 NOTE — Assessment & Plan Note (Addendum)
 Increased from last check...  glucose 126 fasting... will stop sugar in coffee and recheck in 3 months. Continue healthy eating and regular exercise.

## 2023-08-29 ENCOUNTER — Other Ambulatory Visit: Payer: Self-pay | Admitting: Family Medicine

## 2023-09-30 ENCOUNTER — Ambulatory Visit
Admission: EM | Admit: 2023-09-30 | Discharge: 2023-09-30 | Disposition: A | Discharge status: Home / Self Care | Attending: Emergency Medicine | Admitting: Emergency Medicine

## 2023-09-30 DIAGNOSIS — R21 Rash and other nonspecific skin eruption: Secondary | ICD-10-CM | POA: Diagnosis not present

## 2023-09-30 DIAGNOSIS — T63441A Toxic effect of venom of bees, accidental (unintentional), initial encounter: Secondary | ICD-10-CM | POA: Diagnosis not present

## 2023-09-30 MED ORDER — PREDNISONE 10 MG (21) PO TBPK: ORAL_TABLET | Freq: Every day | ORAL | 0 refills | Status: DC

## 2023-09-30 MED ORDER — DEXAMETHASONE SODIUM PHOSPHATE 10 MG/ML IJ SOLN
10.0000 mg | Freq: Once | INTRAMUSCULAR | Status: AC
  Administered 2023-09-30: 10 mg via INTRAMUSCULAR

## 2023-09-30 NOTE — ED Triage Notes (Signed)
 Pt reports being stung by multiple bees while cutting grass at home this am. Pt reports taking benadryl at home. Pt reports itching to area of stings.   Pt denies any severe reaction to previous stings. Pt denies fevers, n/v, excessive drooling, or throat pain.

## 2023-09-30 NOTE — Discharge Instructions (Addendum)
 You were given an injection of a steroid called dexamethasone .  Start the prednisone  taper tomorrow as directed.    Take Claritin and Zyrtec as directed.    Follow up with your primary care provider.   Go to the emergency department if you have worsening symptoms.

## 2023-09-30 NOTE — ED Provider Notes (Signed)
 [facility]    CSN: [id number] Arrival date & time: 09/30/23  1120      History   Chief Complaint Chief Complaint  Patient presents with   Insect Bite    HPI Candice Lunney is a 58 y.o. male.  Patient presents with multiple bee stings on both legs that occurred approximately 2 to 3 hours ago when he was cutting grass at home and ran over a nest.  He has approximately 20 bee stings, mostly on his lower legs.  He took 50 mg of Benadryl.  At the time of injury, he felt short of breath and dizzy but the symptoms resolved.  He denies current shortness of breath, current dizziness, difficulty swallowing, voice change, chest pain.  He reports no history of allergic reaction to bee stings.  The history is provided by the patient and medical records.    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   Complex valvuloplasty including triangular resection of flail posterior leaflet, artificial Gore-tex neocord placement x4, Sorin Memo 3D ring annuloplasty (size 34 mm) via right mini thoracotomy approach    Patient Active Problem List   Diagnosis Date Noted   Elevated PSA 06/30/2023   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   Coronary artery disease 09/18/2013   Internal hemorrhoid, bleeding 08/16/2013   Prediabetes 04/09/2013   Gout 11/22/2008   HYPERLIPIDEMIA 02/23/2007   Esophagitis 02/23/2007    Past  Surgical History:  Procedure Laterality Date   CARDIAC CATHETERIZATION  09/18/13   HAND SURGERY     R hand   INTRAOPERATIVE TRANSESOPHAGEAL ECHOCARDIOGRAM N/A 10/11/2013   Procedure: INTRAOPERATIVE TRANSESOPHAGEAL ECHOCARDIOGRAM;  Surgeon: Sudie VEAR Laine, MD;  Location: [facility] OR;  Service: Open Heart Surgery;  Laterality: N/A;   KNEE SURGERY Left    arthroscopy   LEFT AND RIGHT HEART CATHETERIZATION WITH CORONARY ANGIOGRAM N/A 09/18/2013   Procedure: LEFT AND RIGHT HEART CATHETERIZATION WITH CORONARY ANGIOGRAM;  Surgeon: Ozell JONETTA Fell, MD;  Location: [facility] CATH LAB;  Service: Cardiovascular;  Laterality: N/A;   MITRAL VALVE REPAIR Right 10/11/2013   Procedure: MINIMALLY INVASIVE MITRAL VALVE REPAIR (MVR);  Surgeon: Sudie VEAR Laine, MD;  Location: [facility] OR;  Service: Open Heart Surgery;  Laterality: Right;   TEE WITHOUT CARDIOVERSION N/A 08/26/2013   Procedure: TRANSESOPHAGEAL ECHOCARDIOGRAM (TEE);  Surgeon: Leim VEAR Moose, MD;  Location: [facility] ENDOSCOPY;  Service: Cardiovascular;  Laterality: N/A;   WISDOM TOOTH EXTRACTION         Home Medications    Prior to Admission medications   Medication Sig Start Date End Date Taking? Authorizing Provider  predniSONE  (STERAPRED UNI-PAK 21 TAB) 10 MG (21) TBPK tablet Take by mouth daily. As directed 10/01/23  Yes Corlis Burnard VEAR, NP  allopurinol  (ZYLOPRIM ) 100 MG tablet TAKE 1 TABLET BY MOUTH EVERY DAY 08/29/23  Bedsole, Amy E, MD  amoxicillin  (AMOXIL ) 500 MG capsule Take 4 capsules (2,000mg ) by mouth 30-60 minutes prior to dental work and cleanings Patient not taking: Reported on 09/30/2023 06/16/21   Wonda Sharper, MD  rosuvastatin  (CRESTOR ) 5 MG tablet Take 1 tablet (5 mg total) by mouth daily. 02/20/23   Wonda Sharper, MD    Family History Family History  Problem Relation Age of Onset   Hyperlipidemia Mother    Heart attack Father 50   CVA Father 4   Gout Father    Gout Maternal Grandmother    Gout Maternal Grandfather    Gout Paternal  Grandmother    Gout Paternal Grandfather    Healthy Sister    Healthy Sister     Social History Social History   Tobacco Use   Smoking status: Never   Smokeless tobacco: Never  Vaping Use   Vaping status: Never Used  Substance Use Topics   Alcohol use: Yes    Alcohol/week: 3.0 - 4.0 standard drinks of alcohol    Types: 3 - 4 Standard drinks or equivalent per week    Comment: weekends   Drug use: No     Allergies   Lisinopril    Review of Systems Review of Systems  HENT:  Negative for sore throat, trouble swallowing and voice change.   Respiratory:  Negative for cough and shortness of breath.   Skin:  Positive for color change and wound.  Neurological:  Negative for dizziness, weakness and numbness.     Physical Exam Triage Vital Signs ED Triage Vitals  Encounter Vitals Group     BP 09/30/23 1153 112/65     Girls Systolic BP Percentile --      Girls Diastolic BP Percentile --      Boys Systolic BP Percentile --      Boys Diastolic BP Percentile --      Pulse Rate 09/30/23 1153 77     Resp 09/30/23 1153 20     Temp 09/30/23 1153 97.9 F (36.6 C)     Temp Source 09/30/23 1153 Oral     SpO2 09/30/23 1153 98 %     Weight --      Height --      Head Circumference --      Peak Flow --      Pain Score 09/30/23 1154 0     Pain Loc --      Pain Education --      Exclude from Growth Chart --    No data found.  Updated Vital Signs BP 112/65 (BP Location: Right Arm)   Pulse 77   Temp 97.9 F (36.6 C) (Oral)   Resp 20   SpO2 98%   Visual Acuity Right Eye Distance:   Left Eye Distance:   Bilateral Distance:    Right Eye Near:   Left Eye Near:    Bilateral Near:     Physical Exam Constitutional:      General: He is not in acute distress. HENT:     Mouth/Throat:     Mouth: Mucous membranes are moist.     Pharynx: Oropharynx is clear.  Cardiovascular:     Rate and Rhythm: Normal rate and regular rhythm.     Heart sounds: Normal heart sounds.   Pulmonary:     Effort: Pulmonary effort is normal. No respiratory distress.     Breath sounds: Normal breath sounds.  Skin:    General: Skin is warm and dry.  Comments: Multiple stings on both lower legs and right thigh.  No drainage or bleeding.  Neurological:     Mental Status: He is alert.      UC Treatments / Results  Labs (all labs ordered are listed, but only abnormal results are displayed) Labs Reviewed - No data to display  EKG   Radiology No results found.  Procedures Procedures (including critical care time)  Medications Ordered in UC Medications  dexamethasone  (DECADRON ) injection 10 mg (10 mg Intramuscular Given 09/30/23 1259)    Initial Impression / Assessment and Plan / UC Course  I have reviewed the triage vital signs and the nursing notes.  Pertinent labs & imaging results that were available during my care of the patient were reviewed by me and considered in my medical decision making (see chart for details).    Rash due to multiple bee stings on both lower legs and right thigh.  No difficulty swallowing or breathing.  Afebrile and vital signs are stable.  Dexamethasone  given here; prednisone  taper starting tomorrow.  Instructed patient to take Claritin in the mornings and Zyrtec at night x 14 days.  Instructed him to follow-up with his PCP on Monday.  ED precautions given.  Education provided on bee stings.  He agrees to plan of care.  Final Clinical Impressions(s) / UC Diagnoses   Final diagnoses:  Rash  Bee sting, accidental or unintentional, initial encounter     Discharge Instructions      You were given an injection of a steroid called dexamethasone .  Start the prednisone  taper tomorrow as directed.    Take Claritin and Zyrtec as directed.    Follow up with your primary care provider.   Go to the emergency department if you have worsening symptoms.       ED Prescriptions     Medication Sig Dispense Auth. Provider   predniSONE   (STERAPRED UNI-PAK 21 TAB) 10 MG (21) TBPK tablet Take by mouth daily. As directed 21 tablet Corlis Burnard DEL, NP      PDMP not reviewed this encounter.   Corlis Burnard DEL, NP 09/30/23 [phone]

## 2023-10-03 ENCOUNTER — Telehealth: Payer: Self-pay | Admitting: *Deleted

## 2023-10-03 NOTE — Telephone Encounter (Signed)
 Copied from CRM [id number]. Topic: Clinical - Medical Advice >> Oct 03, 2023  1:22 PM Lawrence Underwood wrote: Reason for CRM: Patient was seen at the Urgent Care on Saturday due to multiple bee stings and prescribed medication. He would like to know if it's necessary to follow up with Dr.Bedsole.

## 2023-10-04 NOTE — Telephone Encounter (Signed)
 Left message for Lawrence Underwood that it's not necessary to follow up if he is doing well and has no airway compromise.

## 2023-10-06 ENCOUNTER — Ambulatory Visit: Payer: Self-pay | Admitting: Family Medicine

## 2023-10-06 ENCOUNTER — Other Ambulatory Visit (INDEPENDENT_AMBULATORY_CARE_PROVIDER_SITE_OTHER)

## 2023-10-06 DIAGNOSIS — R972 Elevated prostate specific antigen [PSA]: Secondary | ICD-10-CM

## 2023-10-06 DIAGNOSIS — R7303 Prediabetes: Secondary | ICD-10-CM | POA: Diagnosis not present

## 2023-10-06 LAB — BASIC METABOLIC PANEL WITH GFR
BUN: 19 mg/dL (ref 6–23)
CO2: 27 meq/L (ref 19–32)
Calcium: 9.2 mg/dL (ref 8.4–10.5)
Chloride: 102 meq/L (ref 96–112)
Creatinine, Ser: 1.08 mg/dL (ref 0.40–1.50)
GFR: 77.11 mL/min (ref >60.00)
Glucose, Bld: 96 mg/dL (ref 70–99)
Potassium: 4.2 meq/L (ref 3.5–5.1)
Sodium: 139 meq/L (ref 135–145)

## 2023-10-09 LAB — PSA, TOTAL AND FREE
PSA, % Free: 40 % (ref >25)
PSA, Free: 0.8 ng/mL
PSA, Total: 2 ng/mL (ref <=4.0)

## 2024-04-11 ENCOUNTER — Telehealth: Payer: Self-pay | Admitting: Cardiovascular Disease

## 2024-04-11 NOTE — Telephone Encounter (Signed)
" °*  STAT* If patient is at the pharmacy, call can be transferred to refill team.   1. Which medications need to be refilled? (please list name of each medication and dose if known) rosuvastatin  (CRESTOR ) 5 MG tablet   2. Which pharmacy/location (including street and city if local pharmacy) is medication to be sent to? [id number] - [place], [place] - [address]   3. Do they need a 30 day or 90 day supply? 90   Patient is out of medication "

## 2024-04-16 MED ORDER — ROSUVASTATIN CALCIUM 5 MG PO TABS: 5.0000 mg | ORAL_TABLET | Freq: Every day | ORAL | 0 refills | Status: DC

## 2024-04-25 ENCOUNTER — Encounter: Payer: Self-pay | Admitting: Cardiovascular Disease

## 2024-04-25 ENCOUNTER — Ambulatory Visit (HOSPITAL_COMMUNITY)
Admission: RE | Admit: 2024-04-25 | Discharge: 2024-04-25 | Disposition: A | Payer: Self-pay | Source: Ambulatory Visit | Attending: Cardiovascular Disease | Admitting: Cardiovascular Disease

## 2024-04-25 ENCOUNTER — Ambulatory Visit: Admitting: Cardiovascular Disease

## 2024-04-25 VITALS — BP 128/87 | HR 69 | Ht 69.0 in | Wt 169.6 lb

## 2024-04-25 DIAGNOSIS — I2510 Atherosclerotic heart disease of native coronary artery without angina pectoris: Secondary | ICD-10-CM

## 2024-04-25 DIAGNOSIS — E782 Mixed hyperlipidemia: Secondary | ICD-10-CM

## 2024-04-25 DIAGNOSIS — Z9889 Other specified postprocedural states: Secondary | ICD-10-CM | POA: Diagnosis not present

## 2024-04-25 NOTE — Progress Notes (Signed)
 " Cardiology Office Note:    Date:  04/25/2024   ID:  Lawrence Underwood, DOB [birth year], MRN [id number]  PCP:  Avelina Greig BRAVO, MD   [facility] Providers Cardiologist:  Ozell Fell, MD Cardiology APP:  Lelon Glendia ONEIDA DEVONNA     Referring MD: Avelina Greig BRAVO, MD   Chief Complaint  Patient presents with   Follow-up    S/P Mitral valve repair    History of Present Illness:    Lawrence Underwood is a 59 y.o. male with a hx of:  MVP with mitral regurgitation  S/p min invasive mitral valve repair 09/2013 Non-obs Coronary artery disease  Hyperlipidemia  The patient is here alone today.  He remains very fit and is walking 6 miles per day.  He is doing quite well with no complaints of chest pain, chest pressure, shortness of breath, or heart palpitations.  He ran out of his rosuvastatin  and had difficulty getting it filled.  His labs were recently checked but they reflect being off of rosuvastatin  for about 10 days.  His cholesterol is 201, triglycerides 48, HDL 64, LDL 128.  He is now back on rosuvastatin  and tolerating it well at a low dose of 5 mg daily.  Current Medications: Active Medications[1]   Allergies:   Lisinopril    ROS:   Please see the history of present illness.    All other systems reviewed and are negative.  EKGs/Labs/Other Studies Reviewed:    The following studies were reviewed today: Cardiac Studies & Procedures   ______________________________________________________________________________________________     ECHOCARDIOGRAM  ECHOCARDIOGRAM COMPLETE 09/19/2019  Narrative ECHOCARDIOGRAM REPORT    Patient Name:   Lawrence Underwood Date of Exam: 09/19/2019 Medical Rec #:  [id number]      Height:       69.5 in Accession #:    [id number]     Weight:       158.2 lb Date of Birth:  10/21/1965      BSA:          1.880 m Patient Age:    53 years       BP:           104/67 mmHg Patient Gender: M              HR:           70 bpm. Exam Location:  [address]  Procedure: 2D Echo, Cardiac Doppler and Color Doppler  Indications:    Z98.890 S/P Mitral valve repair  History:        Patient has prior history of Echocardiogram examinations, most recent 06/02/2014. CAD; Risk Factors:Dyslipidemia. Hypotension.  Mitral Valve: 34 prosthetic annuloplasty ring valve is present in the mitral position. Procedure Date: 2015.  Sonographer:    Jon Hacker RCS Referring Phys: GLENDIA ONEIDA WEAVER  IMPRESSIONS   1. Normal LVEF, normal and stable transmitral gradients, mean 3 mmHg. 2. Left ventricular ejection fraction, by estimation, is 55 to 60%. The left ventricle has normal function. The left ventricle has no regional wall motion abnormalities. There is mild concentric left ventricular hypertrophy. Left ventricular diastolic function could not be evaluated. 3. Right ventricular systolic function is normal. The right ventricular size is normal. 4. The mitral valve has been repaired/replaced. Trivial mitral valve regurgitation. No evidence of mitral stenosis. The mean mitral valve gradient is 3.0 mmHg with average heart rate of 67 bpm. There is a 34 prosthetic annuloplasty ring present in the mitral position. Procedure Date: 2015. 5.  The aortic valve is normal in structure. Aortic valve regurgitation is not visualized. No aortic stenosis is present. 6. The inferior vena cava is normal in size with greater than 50% respiratory variability, suggesting right atrial pressure of 3 mmHg.  FINDINGS Left Ventricle: Left ventricular ejection fraction, by estimation, is 55 to 60%. The left ventricle has normal function. The left ventricle has no regional wall motion abnormalities. The left ventricular internal cavity size was normal in size. There is mild concentric left ventricular hypertrophy. Left ventricular diastolic function could not be evaluated due to mitral valve repair. Left ventricular diastolic function could not be evaluated.  Right Ventricle: The  right ventricular size is normal. No increase in right ventricular wall thickness. Right ventricular systolic function is normal.  Left Atrium: Left atrial size was normal in size.  Right Atrium: Right atrial size was normal in size.  Pericardium: There is no evidence of pericardial effusion.  Mitral Valve: The mitral valve has been repaired/replaced. Normal mobility of the mitral valve leaflets. Trivial mitral valve regurgitation. There is a 34 prosthetic annuloplasty ring present in the mitral position. Procedure Date: 2015. No evidence of mitral valve stenosis. The mean mitral valve gradient is 3.0 mmHg with average heart rate of 67 bpm.  Tricuspid Valve: The tricuspid valve is normal in structure. Tricuspid valve regurgitation is mild . No evidence of tricuspid stenosis.  Aortic Valve: The aortic valve is normal in structure. Aortic valve regurgitation is not visualized. No aortic stenosis is present.  Pulmonic Valve: The pulmonic valve was normal in structure. Pulmonic valve regurgitation is not visualized. No evidence of pulmonic stenosis.  Aorta: The aortic root is normal in size and structure.  Venous: The inferior vena cava is normal in size with greater than 50% respiratory variability, suggesting right atrial pressure of 3 mmHg.  IAS/Shunts: No atrial level shunt detected by color flow Doppler.   LEFT VENTRICLE PLAX 2D LVIDd:         4.50 cm LVIDs:         3.20 cm LV PW:         1.10 cm LV IVS:        1.20 cm LVOT diam:     2.10 cm LV SV:         55 LV SV Index:   29 LVOT Area:     3.46 cm   RIGHT VENTRICLE RV Basal diam:  3.30 cm TAPSE (M-mode): 1.9 cm  LEFT ATRIUM             Index       RIGHT ATRIUM          Index LA diam:        2.30 cm 1.22 cm/m  RA Area:     9.97 cm LA Vol (A2C):   26.0 ml 13.83 ml/m RA Volume:   25.20 ml 13.41 ml/m LA Vol (A4C):   12.4 ml 6.60 ml/m LA Biplane Vol: 18.5 ml 9.84 ml/m AORTIC VALVE LVOT Vmax:   92.40 cm/s LVOT  Vmean:  53.200 cm/s LVOT VTI:    0.159 m  AORTA Ao Root diam: 3.50 cm  MITRAL VALVE MV Area (PHT): 1.87 cm     SHUNTS MV Mean grad:  3.0 mmHg     Systemic VTI:  0.16 m MV Decel Time: 405 msec     Systemic Diam: 2.10 cm MV E velocity: 106.00 cm/s MV A velocity: 104.00 cm/s MV E/A ratio:  1.02  Leim Moose MD Electronically signed  by Leim Moose MD Signature Date/Time: 10/02/2019/5:37:57 PM    Final          ______________________________________________________________________________________________      EKG:   EKG Interpretation Date/Time:  Thursday April 25 2024 09:32:46 EST Ventricular Rate:  69 PR Interval:  132 QRS Duration:  110 QT Interval:  420 QTC Calculation: 450 R Axis:   -4  Text Interpretation: Normal sinus rhythm Right atrial enlargement Incomplete right bundle branch block When compared with ECG of 20-Feb-2023 15:17, No significant change was found Confirmed by Wonda Sharper [phone]) on 04/25/2024 9:48:45 AM    Recent Labs: 06/23/2023: ALT 29 10/06/2023: BUN 19; Creatinine, Ser 1.08; Potassium 4.2; Sodium 139  Recent Lipid Panel    Component Value Date/Time   CHOL 144 06/23/2023 0743   CHOL 168 10/09/2018 1326   TRIG 55.0 06/23/2023 0743   HDL 61.80 06/23/2023 0743   HDL 58 10/09/2018 1326   CHOLHDL 2 06/23/2023 0743   VLDL 11.0 06/23/2023 0743   LDLCALC 71 06/23/2023 0743   LDLCALC 91 10/09/2018 1326     Risk Assessment/Calculations:                Physical Exam:    VS:  BP 128/87 (BP Location: Left Arm)   Pulse 69   Ht 5' 9 (1.753 m)   Wt 169 lb 9.6 oz (76.9 kg)   SpO2 96%   BMI 25.05 kg/m     Wt Readings from Last 3 Encounters:  04/25/24 169 lb 9.6 oz (76.9 kg)  06/30/23 160 lb 4 oz (72.7 kg)  02/20/23 176 lb 6.4 oz (80 kg)     GEN:  Well nourished, well developed in no acute distress HEENT: Normal NECK: No JVD; No carotid bruits LYMPHATICS: No lymphadenopathy CARDIAC: RRR, no murmurs, rubs,  gallops RESPIRATORY:  Clear to auscultation without rales, wheezing or rhonchi  ABDOMEN: Soft, non-tender, non-distended MUSCULOSKELETAL:  No edema; No deformity  SKIN: Warm and dry NEUROLOGIC:  Alert and oriented x 3 PSYCHIATRIC:  Normal affect   Assessment & Plan S/P minimally invasive mitral valve repair His exam remains entirely normal.  Last assessment showed normal function of his mitral repair site.  LVEF is normal.  Patient asymptomatic.  Clinical follow-up in 1 year. Coronary artery disease involving native coronary artery of native heart without angina pectoris Patient underwent preoperative cardiac catheterization now greater than a decade ago for his mitral valve surgery.  He was noted to have mild nonobstructive CAD involving the right coronary artery and otherwise normal vessels on the left main, LAD, and left circumflex.  I recommended a coronary calcium  score to further risk stratify him.  Likely will need to intensify his lipid-lowering therapy. Mixed hyperlipidemia LDL has previously been in the 80s on treatment.  He may need intensification of his lipid-lowering therapy.  Will await his coronary calcium  score and consider increasing rosuvastatin  +/- addition of ezetimibe .            Medication Adjustments/Labs and Tests Ordered: Current medicines are reviewed at length with the patient today.  Concerns regarding medicines are outlined above.  Orders Placed This Encounter  Procedures   CT CARDIAC SCORING (SELF PAY ONLY)   EKG 12-Lead   No orders of the defined types were placed in this encounter.   Patient Instructions  Medication Instructions:  No medication changes were made at this visit. Continue current regimen.   *If you need a refill on your cardiac medications before your next appointment, please call your  pharmacy*  Lab Work: None ordered today. If you have labs (blood work) drawn today and your tests are completely normal, you will receive your  results only by: MyChart Message (if you have MyChart) OR A paper copy in the mail If you have any lab test that is abnormal or we need to change your treatment, we will call you to review the results.  Testing/Procedures: Your physician has requested that you have a coronary calcium  score performed. This is not covered by insurance and will be an out-of-pocket cost of approximately $99.   Follow-Up: At [facility], you and your health needs are our priority.  As part of our continuing mission to provide you with exceptional heart care, our providers are all part of one team.  This team includes your primary Cardiologist (physician) and Advanced Practice Providers or APPs (Physician Assistants and Nurse Practitioners) who all work together to provide you with the care you need, when you need it.  Your next appointment:   1 year(s)  Provider:   Ozell Fell, MD     Signed, Ozell Fell, MD  04/25/2024 10:10 AM    [facility]     [1]  Current Meds  Medication Sig   allopurinol  (ZYLOPRIM ) 100 MG tablet TAKE 1 TABLET BY MOUTH EVERY DAY   amoxicillin  (AMOXIL ) 500 MG capsule Take 4 capsules (2,000mg ) by mouth 30-60 minutes prior to dental work and cleanings (Patient taking differently: as directed. Take 4 capsules (2,000mg ) by mouth 30-60 minutes prior to dental work and cleanings)   rosuvastatin  (CRESTOR ) 5 MG tablet Take 1 tablet (5 mg total) by mouth daily.   [DISCONTINUED] predniSONE  (STERAPRED UNI-PAK 21 TAB) 10 MG (21) TBPK tablet Take by mouth daily. As directed   "

## 2024-04-25 NOTE — Assessment & Plan Note (Signed)
 His exam remains entirely normal.  Last assessment showed normal function of his mitral repair site.  LVEF is normal.  Patient asymptomatic.  Clinical follow-up in 1 year.

## 2024-04-25 NOTE — Assessment & Plan Note (Signed)
 LDL has previously been in the 80s on treatment.  He may need intensification of his lipid-lowering therapy.  Will await his coronary calcium  score and consider increasing rosuvastatin  +/- addition of ezetimibe .

## 2024-04-25 NOTE — Patient Instructions (Signed)
 Medication Instructions:  No medication changes were made at this visit. Continue current regimen.   *If you need a refill on your cardiac medications before your next appointment, please call your pharmacy*  Lab Work: None ordered today. If you have labs (blood work) drawn today and your tests are completely normal, you will receive your results only by: MyChart Message (if you have MyChart) OR A paper copy in the mail If you have any lab test that is abnormal or we need to change your treatment, we will call you to review the results.  Testing/Procedures: Your physician has requested that you have a coronary calcium  score performed. This is not covered by insurance and will be an out-of-pocket cost of approximately $99.   Follow-Up: At [facility], you and your health needs are our priority.  As part of our continuing mission to provide you with exceptional heart care, our providers are all part of one team.  This team includes your primary Cardiologist (physician) and Advanced Practice Providers or APPs (Physician Assistants and Nurse Practitioners) who all work together to provide you with the care you need, when you need it.  Your next appointment:   1 year(s)  Provider:   Ozell Fell, MD

## 2024-04-25 NOTE — Assessment & Plan Note (Signed)
 Patient underwent preoperative cardiac catheterization now greater than a decade ago for his mitral valve surgery.  He was noted to have mild nonobstructive CAD involving the right coronary artery and otherwise normal vessels on the left main, LAD, and left circumflex.  I recommended a coronary calcium  score to further risk stratify him.  Likely will need to intensify his lipid-lowering therapy.

## 2024-04-26 ENCOUNTER — Ambulatory Visit: Payer: Self-pay | Admitting: Cardiovascular Disease

## 2024-04-26 DIAGNOSIS — E782 Mixed hyperlipidemia: Secondary | ICD-10-CM

## 2024-04-26 MED ORDER — ROSUVASTATIN CALCIUM 10 MG PO TABS: 10.0000 mg | ORAL_TABLET | Freq: Every day | ORAL | 3 refills | Status: AC

## 2024-07-05 ENCOUNTER — Encounter: Admitting: Family Medicine
